# Patient Record
Sex: Male | Born: 1970 | Race: White | Hispanic: No | Marital: Single | Smoking: Former smoker
Health system: Southern US, Community
[De-identification: ages and names within clinical notes are randomized; demographics above are authoritative.]

## PROBLEM LIST (undated history)

## (undated) DIAGNOSIS — R112 Nausea with vomiting, unspecified: Secondary | ICD-10-CM

## (undated) DIAGNOSIS — K469 Unspecified abdominal hernia without obstruction or gangrene: Secondary | ICD-10-CM

## (undated) DIAGNOSIS — F329 Major depressive disorder, single episode, unspecified: Secondary | ICD-10-CM

## (undated) DIAGNOSIS — F209 Schizophrenia, unspecified: Secondary | ICD-10-CM

## (undated) DIAGNOSIS — Z9889 Other specified postprocedural states: Secondary | ICD-10-CM

## (undated) DIAGNOSIS — F32A Depression, unspecified: Secondary | ICD-10-CM

## (undated) DIAGNOSIS — G40909 Epilepsy, unspecified, not intractable, without status epilepticus: Secondary | ICD-10-CM

## (undated) DIAGNOSIS — F419 Anxiety disorder, unspecified: Secondary | ICD-10-CM

## (undated) DIAGNOSIS — F319 Bipolar disorder, unspecified: Secondary | ICD-10-CM

## (undated) HISTORY — DX: Anxiety disorder, unspecified: F41.9

## (undated) HISTORY — PX: ANKLE FRACTURE SURGERY: SHX122

## (undated) HISTORY — DX: Unspecified abdominal hernia without obstruction or gangrene: K46.9

---

## 1996-08-18 HISTORY — PX: OTHER SURGICAL HISTORY: SHX169

## 1998-02-28 ENCOUNTER — Emergency Department (HOSPITAL_COMMUNITY): Admission: EM | Admit: 1998-02-28 | Discharge: 1998-02-28 | Payer: Self-pay | Admitting: Emergency Medicine

## 1998-07-27 ENCOUNTER — Encounter: Payer: Self-pay | Admitting: Emergency Medicine

## 1998-07-27 ENCOUNTER — Emergency Department (HOSPITAL_COMMUNITY): Admission: EM | Admit: 1998-07-27 | Discharge: 1998-07-27 | Payer: Self-pay | Admitting: Emergency Medicine

## 2002-02-24 ENCOUNTER — Emergency Department (HOSPITAL_COMMUNITY): Admission: EM | Admit: 2002-02-24 | Discharge: 2002-02-24 | Payer: Self-pay

## 2002-06-20 ENCOUNTER — Emergency Department (HOSPITAL_COMMUNITY): Admission: EM | Admit: 2002-06-20 | Discharge: 2002-06-20 | Payer: Self-pay | Admitting: Emergency Medicine

## 2002-06-20 ENCOUNTER — Encounter: Payer: Self-pay | Admitting: Emergency Medicine

## 2003-04-20 ENCOUNTER — Emergency Department (HOSPITAL_COMMUNITY): Admission: AD | Admit: 2003-04-20 | Discharge: 2003-04-20 | Payer: Self-pay | Admitting: Emergency Medicine

## 2006-01-20 ENCOUNTER — Emergency Department (HOSPITAL_COMMUNITY): Admission: EM | Admit: 2006-01-20 | Discharge: 2006-01-21 | Payer: Self-pay | Admitting: Emergency Medicine

## 2007-02-27 ENCOUNTER — Emergency Department (HOSPITAL_COMMUNITY): Admission: EM | Admit: 2007-02-27 | Discharge: 2007-02-27 | Payer: Self-pay | Admitting: *Deleted

## 2007-03-28 ENCOUNTER — Emergency Department (HOSPITAL_COMMUNITY): Admission: EM | Admit: 2007-03-28 | Discharge: 2007-03-28 | Payer: Self-pay | Admitting: Emergency Medicine

## 2007-04-23 ENCOUNTER — Emergency Department (HOSPITAL_COMMUNITY): Admission: EM | Admit: 2007-04-23 | Discharge: 2007-04-23 | Payer: Self-pay | Admitting: Emergency Medicine

## 2007-06-10 ENCOUNTER — Emergency Department (HOSPITAL_COMMUNITY): Admission: EM | Admit: 2007-06-10 | Discharge: 2007-06-10 | Payer: Self-pay | Admitting: Emergency Medicine

## 2007-07-03 ENCOUNTER — Emergency Department (HOSPITAL_COMMUNITY): Admission: EM | Admit: 2007-07-03 | Discharge: 2007-07-03 | Payer: Self-pay | Admitting: Emergency Medicine

## 2007-07-07 ENCOUNTER — Emergency Department (HOSPITAL_COMMUNITY): Admission: EM | Admit: 2007-07-07 | Discharge: 2007-07-07 | Payer: Self-pay | Admitting: Emergency Medicine

## 2008-06-27 ENCOUNTER — Emergency Department (HOSPITAL_COMMUNITY): Admission: EM | Admit: 2008-06-27 | Discharge: 2008-06-27 | Payer: Self-pay | Admitting: Emergency Medicine

## 2008-10-02 ENCOUNTER — Emergency Department (HOSPITAL_COMMUNITY): Admission: EM | Admit: 2008-10-02 | Discharge: 2008-10-02 | Payer: Self-pay | Admitting: Emergency Medicine

## 2008-10-16 ENCOUNTER — Emergency Department (HOSPITAL_COMMUNITY): Admission: EM | Admit: 2008-10-16 | Discharge: 2008-10-17 | Payer: Self-pay | Admitting: Emergency Medicine

## 2008-10-29 ENCOUNTER — Ambulatory Visit: Payer: Self-pay | Admitting: Occupational Medicine

## 2009-02-04 ENCOUNTER — Emergency Department (HOSPITAL_COMMUNITY): Admission: EM | Admit: 2009-02-04 | Discharge: 2009-02-04 | Payer: Self-pay | Admitting: Emergency Medicine

## 2009-04-16 ENCOUNTER — Emergency Department (HOSPITAL_COMMUNITY): Admission: EM | Admit: 2009-04-16 | Discharge: 2009-04-16 | Payer: Self-pay | Admitting: Emergency Medicine

## 2009-07-06 ENCOUNTER — Emergency Department (HOSPITAL_COMMUNITY): Admission: EM | Admit: 2009-07-06 | Discharge: 2009-07-06 | Payer: Self-pay | Admitting: Internal Medicine

## 2011-01-10 ENCOUNTER — Emergency Department (HOSPITAL_COMMUNITY): Payer: Medicaid Other

## 2011-01-10 ENCOUNTER — Emergency Department (HOSPITAL_COMMUNITY)
Admission: EM | Admit: 2011-01-10 | Discharge: 2011-01-10 | Disposition: A | Discharge status: Home / Self Care | Payer: Medicaid Other | Attending: Emergency Medicine | Admitting: Emergency Medicine

## 2011-01-10 DIAGNOSIS — M545 Low back pain, unspecified: Secondary | ICD-10-CM | POA: Insufficient documentation

## 2011-01-10 DIAGNOSIS — F988 Other specified behavioral and emotional disorders with onset usually occurring in childhood and adolescence: Secondary | ICD-10-CM | POA: Insufficient documentation

## 2011-01-10 DIAGNOSIS — M549 Dorsalgia, unspecified: Secondary | ICD-10-CM | POA: Insufficient documentation

## 2011-01-10 DIAGNOSIS — F319 Bipolar disorder, unspecified: Secondary | ICD-10-CM | POA: Insufficient documentation

## 2011-01-10 DIAGNOSIS — M25559 Pain in unspecified hip: Secondary | ICD-10-CM | POA: Insufficient documentation

## 2011-01-10 LAB — URINALYSIS, ROUTINE W REFLEX MICROSCOPIC
Bilirubin Urine: NEGATIVE
Glucose, UA: 100 mg/dL — AB
Ketones, ur: NEGATIVE mg/dL
Specific Gravity, Urine: 1.022 (ref 1.005–1.030)
pH: 5.5 (ref 5.0–8.0)

## 2011-01-10 LAB — URINE MICROSCOPIC-ADD ON

## 2011-01-10 LAB — POCT I-STAT, CHEM 8
Creatinine, Ser: 1 mg/dL (ref 0.4–1.5)
Glucose, Bld: 212 mg/dL — ABNORMAL HIGH (ref 70–99)
Hemoglobin: 17.3 g/dL — ABNORMAL HIGH (ref 13.0–17.0)
Potassium: 4.1 mEq/L (ref 3.5–5.1)
Sodium: 138 mEq/L (ref 135–145)

## 2011-01-29 ENCOUNTER — Emergency Department (HOSPITAL_COMMUNITY)
Admission: EM | Admit: 2011-01-29 | Discharge: 2011-01-29 | Discharge status: Against Medical Advice | Payer: Medicaid Other | Attending: Emergency Medicine | Admitting: Emergency Medicine

## 2011-01-29 DIAGNOSIS — M545 Low back pain, unspecified: Secondary | ICD-10-CM | POA: Insufficient documentation

## 2011-01-29 DIAGNOSIS — F952 Tourette's disorder: Secondary | ICD-10-CM | POA: Insufficient documentation

## 2011-01-29 DIAGNOSIS — M542 Cervicalgia: Secondary | ICD-10-CM | POA: Insufficient documentation

## 2011-01-29 DIAGNOSIS — Z79899 Other long term (current) drug therapy: Secondary | ICD-10-CM | POA: Insufficient documentation

## 2011-01-29 DIAGNOSIS — F988 Other specified behavioral and emotional disorders with onset usually occurring in childhood and adolescence: Secondary | ICD-10-CM | POA: Insufficient documentation

## 2011-01-29 DIAGNOSIS — F319 Bipolar disorder, unspecified: Secondary | ICD-10-CM | POA: Insufficient documentation

## 2011-03-31 ENCOUNTER — Emergency Department (HOSPITAL_COMMUNITY): Payer: Medicaid Other

## 2011-03-31 ENCOUNTER — Emergency Department (HOSPITAL_COMMUNITY)
Admission: EM | Admit: 2011-03-31 | Discharge: 2011-03-31 | Disposition: A | Discharge status: Home / Self Care | Payer: Medicaid Other | Attending: Emergency Medicine | Admitting: Emergency Medicine

## 2011-03-31 DIAGNOSIS — R404 Transient alteration of awareness: Secondary | ICD-10-CM | POA: Insufficient documentation

## 2011-03-31 DIAGNOSIS — R51 Headache: Secondary | ICD-10-CM | POA: Insufficient documentation

## 2011-03-31 DIAGNOSIS — R109 Unspecified abdominal pain: Secondary | ICD-10-CM | POA: Insufficient documentation

## 2011-03-31 DIAGNOSIS — F988 Other specified behavioral and emotional disorders with onset usually occurring in childhood and adolescence: Secondary | ICD-10-CM | POA: Insufficient documentation

## 2011-03-31 DIAGNOSIS — F319 Bipolar disorder, unspecified: Secondary | ICD-10-CM | POA: Insufficient documentation

## 2011-03-31 DIAGNOSIS — K449 Diaphragmatic hernia without obstruction or gangrene: Secondary | ICD-10-CM | POA: Insufficient documentation

## 2011-03-31 DIAGNOSIS — S139XXA Sprain of joints and ligaments of unspecified parts of neck, initial encounter: Secondary | ICD-10-CM | POA: Insufficient documentation

## 2011-03-31 DIAGNOSIS — M542 Cervicalgia: Secondary | ICD-10-CM | POA: Insufficient documentation

## 2011-03-31 MED ORDER — IOHEXOL 300 MG/ML  SOLN
100.0000 mL | Freq: Once | INTRAMUSCULAR | Status: AC | PRN
  Administered 2011-03-31: 100 mL via INTRAVENOUS

## 2011-10-03 ENCOUNTER — Ambulatory Visit (INDEPENDENT_AMBULATORY_CARE_PROVIDER_SITE_OTHER): Payer: Medicaid Other | Admitting: Surgery

## 2011-10-03 ENCOUNTER — Encounter (INDEPENDENT_AMBULATORY_CARE_PROVIDER_SITE_OTHER): Payer: Self-pay | Admitting: Surgery

## 2011-10-03 VITALS — BP 118/80 | HR 68 | Temp 98.1°F | Resp 16 | Ht 73.0 in | Wt 201.6 lb

## 2011-10-03 DIAGNOSIS — K219 Gastro-esophageal reflux disease without esophagitis: Secondary | ICD-10-CM

## 2011-10-03 NOTE — Progress Notes (Signed)
Chief Complaint:  GERD with nocturnal reflux  History of Present Illness:  Russell [name] is an 41 y.o. male with worsening symptoms of gastroesophageal reflux. He states that he often sleeps sitting up and he gives a good history of nocturnal reflux with coughing and spitting up when lying supine. He is followed by Cameron Sprang at [facility] on [address].  He had a CT scan performed in August of last year that showed a hiatal hernia. He also a small umbilical hernia containing mesenteric fat. Rosalie Gums who read the CT, there was a moderate hiatal hernia. I would like to get an upper GI series to look at peristalsis, the location of EG junction and the size of the hiatal hernia.  Past Medical History  Diagnosis Date  . Hernia     Past Surgical History  Procedure Date  . Ankle fracture surgery 1994 - approximate  . Ligament repair 1998    right arm    Current Outpatient Prescriptions  Medication Sig Dispense Refill  . alprazolam (XANAX) 2 MG tablet Take 2 mg by mouth at bedtime as needed.      Marland Kitchen omeprazole (PRILOSEC) 40 MG capsule Take 40 mg by mouth daily.       Ultram History reviewed. No pertinent family history. Social History:   reports that he has been passively smoking.  He has never used smokeless tobacco. He reports that he drinks alcohol. He reports that he does not use illicit drugs.   REVIEW OF SYSTEMS - PERTINENT POSITIVES ONLY: GERD  Physical Exam:   Blood pressure 118/80, pulse 68, temperature 98.1 F (36.7 C), temperature source Temporal, resp. rate 16, height 6\' 1"  (1.854 m), weight 201 lb 9.6 oz (91.445 kg). Body mass index is 26.60 kg/(m^2).  Gen:  WDWN WM NAD  Neurological: Alert and oriented to person, place, and time. Motor and sensory function is grossly intact  Head: Normocephalic and atraumatic.  Eyes: Conjunctivae are normal. Pupils are equal, round, and reactive to light. No scleral icterus.  Neck: Normal range of motion. Neck supple. No  tracheal deviation or thyromegaly present.  Cardiovascular:  SR without murmurs or gallops.  No carotid bruits Respiratory: Effort normal.  No respiratory distress. No chest wall tenderness. Breath sounds normal.  No wheezes, rales or rhonchi.  Abdomen:  Flat, nontender GU: Musculoskeletal: Normal range of motion. Extremities are nontender. No cyanosis, edema or clubbing noted Lymphadenopathy: No cervical, preauricular, postauricular or axillary adenopathy is present Skin: Skin is warm and dry. No rash noted. No diaphoresis. No erythema. No pallor. Pscyh: Normal mood and affect. Behavior is normal. Judgment and thought content normal.   LABORATORY RESULTS: No results found for this or any previous visit (from the past 48 hour(s)).  RADIOLOGY RESULTS: No results found.  Problem List: There is no problem list on file for this patient.   Assessment & Plan: Positive history of mechanical reflux and hiatal hernia. Plan upper GI series followed by schedule him for a laparoscopic Nissen fundoplication. I have discussed the procedure with him in some detail and gave him a booklet on the operation and mentioned risk and complications and the possibility of having to do it open. We'll go ahead and schedule at his convenience.    Matt B. Daphine Deutscher, MD, [facility] [phone] beeper [phone]  10/03/2011 11:55 AM

## 2011-10-06 ENCOUNTER — Other Ambulatory Visit (INDEPENDENT_AMBULATORY_CARE_PROVIDER_SITE_OTHER): Payer: Self-pay | Admitting: General Surgery

## 2011-10-06 DIAGNOSIS — K449 Diaphragmatic hernia without obstruction or gangrene: Secondary | ICD-10-CM

## 2011-10-09 ENCOUNTER — Inpatient Hospital Stay (HOSPITAL_COMMUNITY): Admission: RE | Admit: 2011-10-09 | Payer: No Typology Code available for payment source | Source: Ambulatory Visit

## 2011-10-15 ENCOUNTER — Ambulatory Visit (HOSPITAL_COMMUNITY): Admission: RE | Admit: 2011-10-15 | Payer: Medicaid Other | Source: Ambulatory Visit | Admitting: Surgery

## 2011-10-15 ENCOUNTER — Encounter (HOSPITAL_COMMUNITY): Admission: RE | Payer: Self-pay | Source: Ambulatory Visit

## 2011-10-15 SURGERY — FUNDOPLICATION, NISSEN, LAPAROSCOPIC
Anesthesia: General

## 2012-02-24 ENCOUNTER — Encounter (HOSPITAL_COMMUNITY): Payer: Self-pay | Admitting: Physical Medicine and Rehabilitation

## 2012-02-24 DIAGNOSIS — Z888 Allergy status to other drugs, medicaments and biological substances status: Secondary | ICD-10-CM | POA: Insufficient documentation

## 2012-02-24 DIAGNOSIS — S01501A Unspecified open wound of lip, initial encounter: Secondary | ICD-10-CM | POA: Insufficient documentation

## 2012-02-24 DIAGNOSIS — F17200 Nicotine dependence, unspecified, uncomplicated: Secondary | ICD-10-CM | POA: Insufficient documentation
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
## 2012-02-25 ENCOUNTER — Emergency Department (HOSPITAL_COMMUNITY)
Admission: EM | Admit: 2012-02-25 | Discharge: 2012-02-25 | Disposition: A | Discharge status: Home / Self Care | Payer: Medicaid Other | Attending: Emergency Medicine | Admitting: Emergency Medicine

## 2012-02-25 DIAGNOSIS — S01511A Laceration without foreign body of lip, initial encounter: Secondary | ICD-10-CM

## 2012-02-25 MED ORDER — PENICILLIN V POTASSIUM 500 MG PO TABS: 500.0000 mg | ORAL_TABLET | Freq: Three times a day (TID) | ORAL | Status: AC
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
## 2012-02-26 NOTE — ED Provider Notes (Signed)
Medical screening examination/treatment/procedure(s) were performed by non-physician practitioner and as supervising physician I was immediately available for consultation/collaboration.  Collen Vincent, MD 02/26/12 2343 

## 2012-09-19 ENCOUNTER — Emergency Department (HOSPITAL_COMMUNITY): Payer: Medicaid Other

## 2012-09-19 ENCOUNTER — Encounter (HOSPITAL_COMMUNITY): Payer: Self-pay | Admitting: Emergency Medicine

## 2012-09-19 ENCOUNTER — Emergency Department (HOSPITAL_COMMUNITY)
Admission: EM | Admit: 2012-09-19 | Discharge: 2012-09-20 | Disposition: A | Discharge status: Home / Self Care | Payer: Medicaid Other | Attending: Emergency Medicine | Admitting: Emergency Medicine

## 2012-09-19 DIAGNOSIS — S6990XA Unspecified injury of unspecified wrist, hand and finger(s), initial encounter: Secondary | ICD-10-CM | POA: Insufficient documentation

## 2012-09-19 DIAGNOSIS — S0993XA Unspecified injury of face, initial encounter: Secondary | ICD-10-CM | POA: Insufficient documentation

## 2012-09-19 DIAGNOSIS — Z8719 Personal history of other diseases of the digestive system: Secondary | ICD-10-CM | POA: Insufficient documentation

## 2012-09-19 DIAGNOSIS — Y939 Activity, unspecified: Secondary | ICD-10-CM | POA: Insufficient documentation

## 2012-09-19 DIAGNOSIS — S79919A Unspecified injury of unspecified hip, initial encounter: Secondary | ICD-10-CM | POA: Insufficient documentation

## 2012-09-19 DIAGNOSIS — Z79899 Other long term (current) drug therapy: Secondary | ICD-10-CM | POA: Insufficient documentation

## 2012-09-19 DIAGNOSIS — S79929A Unspecified injury of unspecified thigh, initial encounter: Secondary | ICD-10-CM | POA: Insufficient documentation

## 2012-09-19 DIAGNOSIS — G40909 Epilepsy, unspecified, not intractable, without status epilepticus: Secondary | ICD-10-CM

## 2012-09-19 DIAGNOSIS — S199XXA Unspecified injury of neck, initial encounter: Secondary | ICD-10-CM | POA: Insufficient documentation

## 2012-09-19 DIAGNOSIS — Y92410 Unspecified street and highway as the place of occurrence of the external cause: Secondary | ICD-10-CM | POA: Insufficient documentation

## 2012-09-19 DIAGNOSIS — S59909A Unspecified injury of unspecified elbow, initial encounter: Secondary | ICD-10-CM | POA: Insufficient documentation

## 2012-09-19 LAB — URINALYSIS, ROUTINE W REFLEX MICROSCOPIC
Glucose, UA: NEGATIVE mg/dL
Leukocytes, UA: NEGATIVE
Protein, ur: NEGATIVE mg/dL
pH: 6 (ref 5.0–8.0)

## 2012-09-19 MED ORDER — IOHEXOL 300 MG/ML  SOLN
100.0000 mL | Freq: Once | INTRAMUSCULAR | Status: AC | PRN
  Administered 2012-09-19: 100 mL via INTRAVENOUS

## 2012-09-19 MED ORDER — SODIUM CHLORIDE 0.9 % IV SOLN
INTRAVENOUS | Status: DC
  Administered 2012-09-19 – 2012-09-20 (×2): via INTRAVENOUS
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
## 2012-09-20 LAB — CBC WITH DIFFERENTIAL/PLATELET
Lymphocytes Relative: 20 % (ref 12–46)
Lymphs Abs: 1.8 10*3/uL (ref 0.7–4.0)
Neutrophils Relative %: 71 % (ref 43–77)
Platelets: 227 10*3/uL (ref 150–400)
RBC: 4.68 MIL/uL (ref 4.22–5.81)
WBC: 9 10*3/uL (ref 4.0–10.5)

## 2012-09-20 LAB — POCT I-STAT, CHEM 8
BUN: 8 mg/dL (ref 6–23)
Chloride: 104 mEq/L (ref 96–112)
HCT: 44 % (ref 39.0–52.0)
Sodium: 138 mEq/L (ref 135–145)
TCO2: 25 mmol/L (ref 0–100)

## 2012-09-20 MED ORDER — METHOCARBAMOL 500 MG PO TABS: 500.0000 mg | ORAL_TABLET | Freq: Two times a day (BID) | ORAL | Status: DC

## 2012-09-20 MED ORDER — HYDROCODONE-ACETAMINOPHEN 5-325 MG PO TABS: 2.0000 | ORAL_TABLET | ORAL | Status: DC | PRN

## 2012-09-20 MED ORDER — HYDROCODONE-ACETAMINOPHEN 5-325 MG PO TABS
2.0000 | ORAL_TABLET | Freq: Once | ORAL | Status: AC
  Administered 2012-09-20: 2 via ORAL
  Filled 2012-09-20: qty 2
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
## 2012-09-20 NOTE — ED Notes (Signed)
While ambulating pt denied lightheadedness/dizziness.  Pt back in room stating he is ready to leave.

## 2012-09-20 NOTE — ED Notes (Signed)
Pt discharged.Vital signs stable and GCS 15 

## 2012-09-21 NOTE — ED Provider Notes (Signed)
Medical screening examination/treatment/procedure(s) were conducted as a shared visit with non-physician practitioner(s) and myself.  I personally evaluated the patient during the encounter  Doug Sou, MD 09/21/12 1337

## 2013-01-22 ENCOUNTER — Emergency Department (HOSPITAL_COMMUNITY)
Admission: EM | Admit: 2013-01-22 | Discharge: 2013-01-22 | Disposition: A | Discharge status: Home / Self Care | Payer: Medicaid Other | Attending: Emergency Medicine | Admitting: Emergency Medicine

## 2013-01-22 ENCOUNTER — Encounter (HOSPITAL_COMMUNITY): Payer: Self-pay | Admitting: *Deleted

## 2013-01-22 DIAGNOSIS — H53149 Visual discomfort, unspecified: Secondary | ICD-10-CM | POA: Insufficient documentation

## 2013-01-22 DIAGNOSIS — Z79899 Other long term (current) drug therapy: Secondary | ICD-10-CM | POA: Insufficient documentation

## 2013-01-22 DIAGNOSIS — R51 Headache: Secondary | ICD-10-CM | POA: Insufficient documentation

## 2013-01-22 MED ORDER — KETOROLAC TROMETHAMINE 30 MG/ML IJ SOLN
30.0000 mg | Freq: Once | INTRAMUSCULAR | Status: AC
  Administered 2013-01-22: 30 mg via INTRAVENOUS
  Filled 2013-01-22: qty 1

## 2013-01-22 MED ORDER — METOCLOPRAMIDE HCL 5 MG/ML IJ SOLN
10.0000 mg | Freq: Once | INTRAMUSCULAR | Status: AC
  Administered 2013-01-22: 10 mg via INTRAVENOUS
  Filled 2013-01-22: qty 2

## 2013-01-22 MED ORDER — SODIUM CHLORIDE 0.9 % IV SOLN
Freq: Once | INTRAVENOUS | Status: AC
  Administered 2013-01-22: 03:00:00 via INTRAVENOUS

## 2013-01-22 MED ORDER — DIPHENHYDRAMINE HCL 50 MG/ML IJ SOLN
12.5000 mg | Freq: Once | INTRAMUSCULAR | Status: AC
  Administered 2013-01-22: 12.5 mg via INTRAVENOUS
  Filled 2013-01-22: qty 1

## 2013-01-22 NOTE — ED Notes (Signed)
The pt reports that he is having a headache and when he gets these headaches the fluid rushes out of his nose.  He also has a lump growing behind his lt ear causing more pressure

## 2013-01-22 NOTE — ED Notes (Signed)
Chronic diffuse h/a's.  Feels like his head is crushing/brace. States, "x2 bumps behind his left ear. No visual deficits.

## 2013-01-22 NOTE — ED Provider Notes (Signed)
History     CSN: [id number]  Arrival date & time 01/22/13  0143   First MD Initiated Contact with Patient 01/22/13 0215      Chief Complaint  Patient presents with  . Headache    (Consider location/radiation/quality/duration/timing/severity/associated sxs/prior treatment) HPI Comments: 42 year old gentleman, with a history of seizures.  He takes Depakote on a regular basis, states, that for the last, year.  He's had intermittent headaches that are increasing in frequency.  Global in nature, feels, like somebody is squeezing his head.  He becomes photophobic, and he gets a "gush" of mucus from his nose.  He, states his neurologist aware of this, and tonight, told him to come immediately to the emergency department for evaluation.  His never tried to take any over-the-counter nonsteroidals or Tylenol for this discomfort.  Denies any recent seizure activity or trauma  Patient is a 42 y.o. male presenting with headaches. The history is provided by the patient.  Headache Pain location:  Generalized Quality:  Dull Radiates to:  Does not radiate Severity currently:  10/10 Severity at highest:  10/10 Onset quality:  Gradual Duration:  3 hours Timing:  Constant Progression:  Unchanged Chronicity:  Recurrent Similar to prior headaches: yes   Context: bright light   Relieved by:  None tried Worsened by:  Nothing tried Ineffective treatments:  None tried Associated symptoms: numbness, photophobia and sinus pressure   Associated symptoms: no abdominal pain, no congestion, no cough, no dizziness, no ear pain, no pain, no fever, no hearing loss, no nausea, no near-syncope, no neck pain, no neck stiffness, no seizures and no sore throat     Past Medical History  Diagnosis Date  . Hernia     Past Surgical History  Procedure Laterality Date  . Ankle fracture surgery  1994 - approximate  . Ligament repair  1998    right arm    No family history on file.  History  Substance Use Topics   . Smoking status: Passive Smoke Exposure - Never Smoker  . Smokeless tobacco: Never Used  . Alcohol Use: Yes     Comment: socially      Review of Systems  Constitutional: Negative for fever and chills.  HENT: Positive for rhinorrhea and sinus pressure. Negative for hearing loss, ear pain, congestion, sore throat, trouble swallowing, neck pain, neck stiffness, voice change and ear discharge.   Eyes: Positive for photophobia. Negative for pain and visual disturbance.  Respiratory: Negative for cough.   Cardiovascular: Negative for near-syncope.  Gastrointestinal: Negative for nausea and abdominal pain.  Neurological: Positive for numbness and headaches. Negative for dizziness, seizures and weakness.  All other systems reviewed and are negative.    Allergies  Ultram  Home Medications   Current Outpatient Rx  Name  Route  Sig  Dispense  Refill  . divalproex (DEPAKOTE) 500 MG DR tablet   Oral   Take 500 mg by mouth 2 (two) times daily.           BP 119/76  Pulse 85  Temp(Src) 98.2 F (36.8 C) (Oral)  Resp 18  SpO2 96%  Physical Exam  Nursing note and vitals reviewed. Constitutional: He is oriented to person, place, and time. He appears well-developed and well-nourished.  HENT:  Head: Normocephalic and atraumatic.  Right Ear: External ear normal.  Left Ear: External ear normal.  Mouth/Throat: Oropharynx is clear and moist.  Eyes: Pupils are equal, round, and reactive to light.  Neck: Normal range of motion.  Cardiovascular:  Normal rate and regular rhythm.   Pulmonary/Chest: Effort normal and breath sounds normal.  Musculoskeletal: Normal range of motion.  Lymphadenopathy:       Head (left side): Posterior auricular adenopathy present.    He has no cervical adenopathy.  A single node behind the L ear   Neurological: He is alert and oriented to person, place, and time.  Skin: Skin is warm and dry. No rash noted. No erythema. No pallor.    ED Course   Procedures (including critical care time)  Labs Reviewed - No data to display No results found.   No diagnosis found.    MDM  We'll treat with the Benadryl, oral Reglan, and reassess at this time.  I do not feel the patient needs a head CT, without history of trauma, or recent seizure. Sleeping soundly in no apparent distress  I've asked the patient.  To make an appointment with his primary care physician and his neurologist to discuss further his headaches       Arman Filter, NP 01/22/13 0505

## 2013-01-23 NOTE — ED Provider Notes (Signed)
Medical screening examination/treatment/procedure(s) were performed by non-physician practitioner and as supervising physician I was immediately available for consultation/collaboration.   Phong Isenberg W Brenn Gatton, MD 01/23/13 0511 

## 2014-04-17 ENCOUNTER — Encounter (HOSPITAL_COMMUNITY): Payer: Self-pay | Admitting: Emergency Medicine

## 2014-04-17 ENCOUNTER — Emergency Department (HOSPITAL_COMMUNITY)
Admission: EM | Admit: 2014-04-17 | Discharge: 2014-04-17 | Disposition: A | Discharge status: Home / Self Care | Payer: Medicaid Other | Attending: Emergency Medicine | Admitting: Emergency Medicine

## 2014-04-17 ENCOUNTER — Emergency Department (HOSPITAL_COMMUNITY): Payer: Medicaid Other

## 2014-04-17 DIAGNOSIS — G40909 Epilepsy, unspecified, not intractable, without status epilepticus: Secondary | ICD-10-CM | POA: Diagnosis not present

## 2014-04-17 DIAGNOSIS — S6990XA Unspecified injury of unspecified wrist, hand and finger(s), initial encounter: Secondary | ICD-10-CM | POA: Diagnosis present

## 2014-04-17 DIAGNOSIS — Y9389 Activity, other specified: Secondary | ICD-10-CM | POA: Diagnosis not present

## 2014-04-17 DIAGNOSIS — Z8719 Personal history of other diseases of the digestive system: Secondary | ICD-10-CM | POA: Diagnosis not present

## 2014-04-17 DIAGNOSIS — T148XXA Other injury of unspecified body region, initial encounter: Secondary | ICD-10-CM

## 2014-04-17 DIAGNOSIS — F319 Bipolar disorder, unspecified: Secondary | ICD-10-CM | POA: Insufficient documentation

## 2014-04-17 DIAGNOSIS — M79641 Pain in right hand: Secondary | ICD-10-CM

## 2014-04-17 DIAGNOSIS — W2209XA Striking against other stationary object, initial encounter: Secondary | ICD-10-CM | POA: Diagnosis not present

## 2014-04-17 DIAGNOSIS — Z79899 Other long term (current) drug therapy: Secondary | ICD-10-CM | POA: Diagnosis not present

## 2014-04-17 DIAGNOSIS — IMO0002 Reserved for concepts with insufficient information to code with codable children: Secondary | ICD-10-CM | POA: Diagnosis not present

## 2014-04-17 DIAGNOSIS — Y9289 Other specified places as the place of occurrence of the external cause: Secondary | ICD-10-CM | POA: Diagnosis not present

## 2014-04-17 HISTORY — DX: Bipolar disorder, unspecified: F31.9

## 2014-04-17 HISTORY — DX: Epilepsy, unspecified, not intractable, without status epilepticus: G40.909

## 2014-04-17 MED ORDER — HYDROCODONE-ACETAMINOPHEN 5-325 MG PO TABS
1.0000 | ORAL_TABLET | Freq: Four times a day (QID) | ORAL | Status: DC | PRN
Start: 2014-04-17 — End: 2014-04-18

## 2014-04-17 MED ORDER — ONDANSETRON 4 MG PO TBDP
8.0000 mg | ORAL_TABLET | Freq: Once | ORAL | Status: AC
  Administered 2014-04-17: 8 mg via ORAL
  Filled 2014-04-17: qty 2

## 2014-04-17 MED ORDER — HYDROCODONE-ACETAMINOPHEN 5-325 MG PO TABS
2.0000 | ORAL_TABLET | Freq: Once | ORAL | Status: AC
  Administered 2014-04-17: 2 via ORAL
  Filled 2014-04-17: qty 2
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
## 2014-04-17 NOTE — ED Notes (Signed)
Pt reports punching a wall with right hand tonight. States "I have some mental problems and I thought something was attacking me." Pt denies any SI/HI. Pt AO x4, NAD. Pulses intact.

## 2014-04-17 NOTE — Discharge Instructions (Signed)
Abrasions An abrasion is a cut or scrape of the skin. Abrasions do not go through all layers of the skin. HOME CARE  If a bandage (dressing) was put on your wound, change it as told by your doctor. If the bandage sticks, soak it off with warm.  Wash the area with water and soap 2 times a day. Rinse off the soap. Pat the area dry with a clean towel.  Put on medicated cream (ointment) as told by your doctor.  Change your bandage right away if it gets wet or dirty.  Only take medicine as told by your doctor.  See your doctor within 24-48 hours to get your wound checked.  Check your wound for redness, puffiness (swelling), or yellowish-white fluid (pus). GET HELP RIGHT AWAY IF:   You have more pain in the wound.  You have redness, swelling, or tenderness around the wound.  You have pus coming from the wound.  You have a fever or lasting symptoms for more than 2-3 days.  You have a fever and your symptoms suddenly get worse.  You have a bad smell coming from the wound or bandage. MAKE SURE YOU:   Understand these instructions.  Will watch your condition.  Will get help right away if you are not doing well or get worse. Document Released: 01/21/2008 Document Revised: 04/28/2012 Document Reviewed: 07/08/2011 Exit[employer] Patient Information 2015 [employer]. This information is not intended to replace advice given to you by your health care provider. Make sure you discuss any questions you have with your health care provider.  

## 2014-04-18 ENCOUNTER — Encounter (HOSPITAL_COMMUNITY): Payer: Self-pay | Admitting: Emergency Medicine

## 2014-04-18 ENCOUNTER — Emergency Department (HOSPITAL_COMMUNITY)
Admission: EM | Admit: 2014-04-18 | Discharge: 2014-04-19 | Disposition: A | Discharge status: Other Acute Inpatient Hospital | Payer: Medicaid Other | Attending: Dermatology | Admitting: Dermatology

## 2014-04-18 DIAGNOSIS — R45851 Suicidal ideations: Secondary | ICD-10-CM | POA: Diagnosis not present

## 2014-04-18 DIAGNOSIS — Z79899 Other long term (current) drug therapy: Secondary | ICD-10-CM | POA: Diagnosis not present

## 2014-04-18 DIAGNOSIS — IMO0002 Reserved for concepts with insufficient information to code with codable children: Secondary | ICD-10-CM | POA: Insufficient documentation

## 2014-04-18 DIAGNOSIS — R45850 Homicidal ideations: Secondary | ICD-10-CM | POA: Insufficient documentation

## 2014-04-18 DIAGNOSIS — Y929 Unspecified place or not applicable: Secondary | ICD-10-CM | POA: Diagnosis not present

## 2014-04-18 DIAGNOSIS — F3130 Bipolar disorder, current episode depressed, mild or moderate severity, unspecified: Secondary | ICD-10-CM | POA: Insufficient documentation

## 2014-04-18 DIAGNOSIS — Z8719 Personal history of other diseases of the digestive system: Secondary | ICD-10-CM | POA: Diagnosis not present

## 2014-04-18 DIAGNOSIS — Z8659 Personal history of other mental and behavioral disorders: Secondary | ICD-10-CM | POA: Diagnosis not present

## 2014-04-18 DIAGNOSIS — F17200 Nicotine dependence, unspecified, uncomplicated: Secondary | ICD-10-CM | POA: Diagnosis not present

## 2014-04-18 DIAGNOSIS — F200 Paranoid schizophrenia: Secondary | ICD-10-CM

## 2014-04-18 DIAGNOSIS — G40909 Epilepsy, unspecified, not intractable, without status epilepticus: Secondary | ICD-10-CM | POA: Insufficient documentation

## 2014-04-18 DIAGNOSIS — Y9389 Activity, other specified: Secondary | ICD-10-CM | POA: Diagnosis not present

## 2014-04-18 DIAGNOSIS — S6990XA Unspecified injury of unspecified wrist, hand and finger(s), initial encounter: Secondary | ICD-10-CM | POA: Insufficient documentation

## 2014-04-18 DIAGNOSIS — W2209XA Striking against other stationary object, initial encounter: Secondary | ICD-10-CM | POA: Diagnosis not present

## 2014-04-18 HISTORY — DX: Schizophrenia, unspecified: F20.9

## 2014-04-18 LAB — COMPREHENSIVE METABOLIC PANEL
ALBUMIN: 4 g/dL (ref 3.5–5.2)
ALK PHOS: 110 U/L (ref 39–117)
ALT: 34 U/L (ref 0–53)
AST: 30 U/L (ref 0–37)
Anion gap: 16 — ABNORMAL HIGH (ref 5–15)
BUN: 8 mg/dL (ref 6–23)
CO2: 24 mEq/L (ref 19–32)
CREATININE: 1.02 mg/dL (ref 0.50–1.35)
Calcium: 9.5 mg/dL (ref 8.4–10.5)
Chloride: 100 mEq/L (ref 96–112)
GFR calc Af Amer: >90 mL/min (ref >90)
GFR calc non Af Amer: 89 mL/min — ABNORMAL LOW (ref >90)
Glucose, Bld: 110 mg/dL — ABNORMAL HIGH (ref 70–99)
POTASSIUM: 3.8 meq/L (ref 3.7–5.3)
Sodium: 140 mEq/L (ref 137–147)
TOTAL PROTEIN: 7.9 g/dL (ref 6.0–8.3)
Total Bilirubin: <0.2 mg/dL — ABNORMAL LOW (ref 0.3–1.2)

## 2014-04-18 LAB — CBC
HEMATOCRIT: 45.4 % (ref 39.0–52.0)
Hemoglobin: 15.7 g/dL (ref 13.0–17.0)
MCH: 30.8 pg (ref 26.0–34.0)
MCHC: 34.6 g/dL (ref 30.0–36.0)
MCV: 89.2 fL (ref 78.0–100.0)
PLATELETS: 264 10*3/uL (ref 150–400)
RBC: 5.09 MIL/uL (ref 4.22–5.81)
RDW: 14.3 % (ref 11.5–15.5)
WBC: 9.2 10*3/uL (ref 4.0–10.5)

## 2014-04-18 LAB — RAPID URINE DRUG SCREEN, HOSP PERFORMED
Amphetamines: NOT DETECTED
Barbiturates: NOT DETECTED
Benzodiazepines: POSITIVE — AB
COCAINE: NOT DETECTED
OPIATES: POSITIVE — AB
Tetrahydrocannabinol: NOT DETECTED

## 2014-04-18 LAB — VALPROIC ACID LEVEL: Valproic Acid Lvl: 16.6 ug/mL — ABNORMAL LOW (ref 50.0–100.0)

## 2014-04-18 LAB — ETHANOL: ALCOHOL ETHYL (B): 171 mg/dL — AB (ref 0–11)

## 2014-04-18 LAB — SALICYLATE LEVEL: Salicylate Lvl: <2 mg/dL — ABNORMAL LOW (ref 2.8–20.0)

## 2014-04-18 LAB — ACETAMINOPHEN LEVEL: Acetaminophen (Tylenol), Serum: <15 ug/mL (ref 10–30)

## 2014-04-18 MED ORDER — IMIPRAMINE HCL 50 MG PO TABS
100.0000 mg | ORAL_TABLET | Freq: Every day | ORAL | Status: DC
  Administered 2014-04-18: 100 mg via ORAL
  Filled 2014-04-18 (×2): qty 2

## 2014-04-18 MED ORDER — ALUM & MAG HYDROXIDE-SIMETH 200-200-20 MG/5ML PO SUSP
30.0000 mL | ORAL | Status: DC | PRN
  Administered 2014-04-18: 30 mL via ORAL
  Filled 2014-04-18: qty 30

## 2014-04-18 MED ORDER — IMIPRAMINE HCL 50 MG PO TABS: 50.0000 mg | ORAL_TABLET | Freq: Two times a day (BID) | ORAL | Status: DC

## 2014-04-18 MED ORDER — DIVALPROEX SODIUM ER 500 MG PO TB24
500.0000 mg | ORAL_TABLET | Freq: Every day | ORAL | Status: DC
  Administered 2014-04-19: 500 mg via ORAL
  Filled 2014-04-18: qty 1

## 2014-04-18 MED ORDER — PANTOPRAZOLE SODIUM 40 MG PO TBEC
40.0000 mg | DELAYED_RELEASE_TABLET | Freq: Every day | ORAL | Status: DC
  Administered 2014-04-18 – 2014-04-19 (×2): 40 mg via ORAL
  Filled 2014-04-18 (×2): qty 1

## 2014-04-18 MED ORDER — ONDANSETRON HCL 4 MG PO TABS: 4.0000 mg | ORAL_TABLET | Freq: Three times a day (TID) | ORAL | Status: DC | PRN

## 2014-04-18 MED ORDER — PRAZOSIN HCL 2 MG PO CAPS
2.0000 mg | ORAL_CAPSULE | Freq: Every day | ORAL | Status: DC
  Administered 2014-04-18: 2 mg via ORAL
  Filled 2014-04-18 (×2): qty 1

## 2014-04-18 MED ORDER — IMIPRAMINE HCL 50 MG PO TABS
50.0000 mg | ORAL_TABLET | Freq: Every day | ORAL | Status: DC
  Administered 2014-04-19: 50 mg via ORAL
  Filled 2014-04-18: qty 1

## 2014-04-18 MED ORDER — IBUPROFEN 200 MG PO TABS
600.0000 mg | ORAL_TABLET | Freq: Three times a day (TID) | ORAL | Status: DC | PRN
  Administered 2014-04-18: 600 mg via ORAL
  Filled 2014-04-18: qty 3

## 2014-04-18 MED ORDER — LORAZEPAM 1 MG PO TABS
1.0000 mg | ORAL_TABLET | Freq: Three times a day (TID) | ORAL | Status: DC | PRN
  Administered 2014-04-18: 1 mg via ORAL
  Filled 2014-04-18: qty 1

## 2014-04-18 MED ORDER — BENZTROPINE MESYLATE 1 MG PO TABS
0.5000 mg | ORAL_TABLET | Freq: Every day | ORAL | Status: DC
  Administered 2014-04-18: 22:00:00 via ORAL
  Filled 2014-04-18: qty 1

## 2014-04-18 MED ORDER — NICOTINE 21 MG/24HR TD PT24: 21.0000 mg | MEDICATED_PATCH | Freq: Every day | TRANSDERMAL | Status: DC

## 2014-04-18 MED ORDER — DIVALPROEX SODIUM ER 500 MG PO TB24
1000.0000 mg | ORAL_TABLET | Freq: Every day | ORAL | Status: DC
  Administered 2014-04-18: 1000 mg via ORAL
  Filled 2014-04-18 (×2): qty 2

## 2014-04-18 MED ORDER — DIVALPROEX SODIUM ER 500 MG PO TB24: 500.0000 mg | ORAL_TABLET | Freq: Two times a day (BID) | ORAL | Status: DC

## 2014-04-18 NOTE — ED Provider Notes (Signed)
CSN: [id number]     Arrival date & time 04/18/14  1702 History   First MD Initiated Contact with Patient 04/18/14 1747    This chart was scribed for [employer] working with Suzi Roots, MD by Tonye Royalty, ED Scribe. This patient was seen in room WLCON/WLCON and the patient's care was started at 5:49 PM.     Chief Complaint  Patient presents with  . Suicidal  . Homicidal   The history is provided by the patient. No language interpreter was used.   HPI Comments: Tijuan Dantes is a 43 y.o. male who presents to the Emergency Department complaining of suicidal ideation and homicidal ideation with onset a few months ago. He reports a plan to run in front of a truck. He reports associated injury to his right hand due to punching a wall, thinking it was a person, sustained last night. He had x-rays which were negative last night -- although images may need repeated if symptoms do not improve to rule-out occult fracture. He reports past medical history of epilepsy, schizophrenia and bipolar disorder and that he takes many medications for these. Patient reports recent alcohol use. He denies fever, cough, or other illness symptoms.  Past Medical History  Diagnosis Date  . Hernia   . Bipolar 1 disorder   . Epilepsy    Past Surgical History  Procedure Laterality Date  . Ankle fracture surgery  1994 - approximate  . Ligament repair  1998    right arm   No family history on file. History  Substance Use Topics  . Smoking status: Current Some Day Smoker    Types: Cigarettes  . Smokeless tobacco: Never Used  . Alcohol Use: Yes     Comment: socially    Review of Systems  Constitutional: Negative for fever.  HENT: Negative for rhinorrhea and sore throat.   Eyes: Negative for redness.  Respiratory: Negative for cough.   Cardiovascular: Negative for chest pain.  Gastrointestinal: Negative for nausea, vomiting, abdominal pain and diarrhea.  Genitourinary: Negative for dysuria.   Musculoskeletal: Positive for arthralgias and joint swelling. Negative for myalgias.  Skin: Negative for rash.  Neurological: Negative for headaches.  Psychiatric/Behavioral: Positive for suicidal ideas.    Allergies  Ultram  Home Medications   Prior to Admission medications   Medication Sig Start Date End Date Taking? Authorizing Provider  alprazolam Prudy Feeler) 2 MG tablet Take 2 mg by mouth 3 (three) times daily as needed for anxiety.  09/10/12   Historical Provider, MD  benztropine (COGENTIN) 0.5 MG tablet Take 0.5 mg by mouth at bedtime.    Historical Provider, MD  divalproex (DEPAKOTE ER) 500 MG 24 hr tablet Take 500-1,000 mg by mouth 2 (two) times daily. Take 1 tablet (500 mg) every morning and 2 tablets (1000 mg) at bedtime    Historical Provider, MD  HYDROcodone-acetaminophen (NORCO/VICODIN) 5-325 MG per tablet Take 1 tablet by mouth every 6 (six) hours as needed for moderate pain or severe pain. 04/17/14   Mora Bellman, PA-C  imipramine (TOFRANIL) 50 MG tablet Take 50-100 mg by mouth 2 (two) times daily. Take 1 tablet (50 mg) every morning and 2 tablets (100 mg) every night    Historical Provider, MD  omeprazole (PRILOSEC) 40 MG capsule Take 80 mg by mouth daily.    Historical Provider, MD  prazosin (MINIPRESS) 2 MG capsule Take 2 mg by mouth at bedtime. For nightmares    Historical Provider, MD   BP 120/61  Pulse  86  Temp(Src) 98.3 F (36.8 C) (Oral)  Resp 20  SpO2 97%  Physical Exam  Nursing note and vitals reviewed. Constitutional: He appears well-developed and well-nourished.  HENT:  Head: Normocephalic and atraumatic.  Eyes: Conjunctivae are normal. Right eye exhibits no discharge. Left eye exhibits no discharge.  Neck: Normal range of motion. Neck supple.  Cardiovascular: Normal rate, regular rhythm and normal heart sounds.   Pulmonary/Chest: Effort normal and breath sounds normal.  Abdominal: Soft. There is no tenderness.  Musculoskeletal: He exhibits edema and  tenderness.  Swelling and tenderness over dorsum of R hand, 3rd-5th metacarpals. Distal sensation intact. Cap refill < 2 second in fingers of R hand.   Neurological: He is alert.  Skin: Skin is warm and dry.  Psychiatric: His affect is blunt. His speech is slurred (slight). He is agitated. He exhibits a depressed mood. He expresses homicidal and suicidal ideation. He expresses suicidal plans and homicidal plans. He exhibits normal recent memory.    ED Course  Procedures (including critical care time) Labs Review Labs Reviewed  COMPREHENSIVE METABOLIC PANEL - Abnormal; Notable for the following:    Glucose, Bld 110 (*)    Total Bilirubin <0.2 (*)    GFR calc non Af Amer 89 (*)    Anion gap 16 (*)    All other components within normal limits  ETHANOL - Abnormal; Notable for the following:    Alcohol, Ethyl (B) 171 (*)    All other components within normal limits  SALICYLATE LEVEL - Abnormal; Notable for the following:    Salicylate Lvl <2.0 (*)    All other components within normal limits  URINE RAPID DRUG SCREEN (HOSP PERFORMED) - Abnormal; Notable for the following:    Opiates POSITIVE (*)    Benzodiazepines POSITIVE (*)    All other components within normal limits  VALPROIC ACID LEVEL - Abnormal; Notable for the following:    Valproic Acid Lvl 16.6 (*)    All other components within normal limits  ACETAMINOPHEN LEVEL  CBC    Imaging Review Dg Hand Complete Right  04/17/2014   CLINICAL DATA:  Right hand injury status post punching a wall.  EXAM: RIGHT HAND - COMPLETE 3+ VIEW  COMPARISON:  09/19/2012 wrist radiographs, 07/07/2007  FINDINGS: Radial angulation at the fifth PIP joint. No displaced acute fracture or dislocation. Second DIP and fifth PIP degenerative change. Soft tissue swelling overlies the MCP joints on the lateral view.  IMPRESSION: Soft tissue swelling overlying the MCP joints on the lateral view. No displaced acute fracture identified.  Radial angulation of fifth  digit at the PIP joint may reflect sequelae of remote injury. Correlate with range of motion to exclude an acute component.  If concern for an acute fracture persists, recommend a repeat radiograph in 7-10 days to evaluate for interval change or callus formation.   Electronically Signed   By: Jearld Lesch M.D.   On: 04/17/2014 22:13     EKG Interpretation None      COORDINATION OF CARE:  6:02 PM Patient seen and examined. Holding orders completed.   Vital signs reviewed and are as follows: BP 120/61  Pulse 86  Temp(Src) 98.3 F (36.8 C) (Oral)  Resp 20  SpO2 97%  Pending TTS eval.   6:37 PM Labs reviewed. Pt is medically cleared.    8:15 PM Pending TTS eval.    MDM   Final diagnoses:  Suicidal ideation   Pending TTS eval.   I personally performed the  services described in this documentation, which was scribed in my presence. The recorded information has been reviewed and is accurate.      Renne Crigler, PA-C 04/18/14 2017
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
## 2014-04-18 NOTE — ED Notes (Signed)
Pt has blue shirt green shorts black sandals placed behind triage desk

## 2014-04-18 NOTE — ED Notes (Signed)
Patient is anxious, frustrated. Patient's mother Dennie Bible at bedside. Patient signed consent papers for mother Issiah Huffaker [phone], June Leap girlfriend [phone], and Arben Packman father [phone].  Encouragement offered. Given Ativan, Mylanta, Motrin.  Q 15 safety checks in place.
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
## 2014-04-18 NOTE — ED Provider Notes (Signed)
Medical screening examination/treatment/procedure(s) were performed by non-physician practitioner and as supervising physician I was immediately available for consultation/collaboration.   EKG Interpretation None       Nakya Weyand M Queena Monrreal, MD 04/18/14 0416 

## 2014-04-19 ENCOUNTER — Encounter (HOSPITAL_COMMUNITY): Payer: Self-pay

## 2014-04-19 ENCOUNTER — Inpatient Hospital Stay (HOSPITAL_COMMUNITY)
Admission: AD | Admit: 2014-04-19 | Discharge: 2014-04-25 | DRG: 885 | Disposition: A | Discharge status: Home / Self Care | Payer: Medicaid Other | Source: Intra-hospital | Attending: Psychiatry | Admitting: Psychiatry

## 2014-04-19 DIAGNOSIS — F29 Unspecified psychosis not due to a substance or known physiological condition: Secondary | ICD-10-CM

## 2014-04-19 DIAGNOSIS — K219 Gastro-esophageal reflux disease without esophagitis: Secondary | ICD-10-CM | POA: Diagnosis present

## 2014-04-19 DIAGNOSIS — R45851 Suicidal ideations: Secondary | ICD-10-CM | POA: Diagnosis not present

## 2014-04-19 DIAGNOSIS — F1320 Sedative, hypnotic or anxiolytic dependence, uncomplicated: Secondary | ICD-10-CM | POA: Diagnosis present

## 2014-04-19 DIAGNOSIS — G40909 Epilepsy, unspecified, not intractable, without status epilepticus: Secondary | ICD-10-CM | POA: Diagnosis present

## 2014-04-19 DIAGNOSIS — F200 Paranoid schizophrenia: Secondary | ICD-10-CM | POA: Diagnosis present

## 2014-04-19 DIAGNOSIS — F1120 Opioid dependence, uncomplicated: Secondary | ICD-10-CM | POA: Diagnosis present

## 2014-04-19 DIAGNOSIS — F329 Major depressive disorder, single episode, unspecified: Principal | ICD-10-CM | POA: Diagnosis present

## 2014-04-19 DIAGNOSIS — Z5987 Material hardship due to limited financial resources, not elsewhere classified: Secondary | ICD-10-CM

## 2014-04-19 DIAGNOSIS — F17200 Nicotine dependence, unspecified, uncomplicated: Secondary | ICD-10-CM | POA: Diagnosis present

## 2014-04-19 DIAGNOSIS — F32A Depression, unspecified: Secondary | ICD-10-CM | POA: Diagnosis present

## 2014-04-19 DIAGNOSIS — Z598 Other problems related to housing and economic circumstances: Secondary | ICD-10-CM | POA: Diagnosis not present

## 2014-04-19 DIAGNOSIS — F1994 Other psychoactive substance use, unspecified with psychoactive substance-induced mood disorder: Secondary | ICD-10-CM | POA: Diagnosis present

## 2014-04-19 DIAGNOSIS — F319 Bipolar disorder, unspecified: Secondary | ICD-10-CM | POA: Diagnosis present

## 2014-04-19 DIAGNOSIS — Z5989 Other problems related to housing and economic circumstances: Secondary | ICD-10-CM | POA: Diagnosis not present

## 2014-04-19 DIAGNOSIS — F411 Generalized anxiety disorder: Secondary | ICD-10-CM | POA: Diagnosis present

## 2014-04-19 DIAGNOSIS — F1123 Opioid dependence with withdrawal: Secondary | ICD-10-CM

## 2014-04-19 DIAGNOSIS — G4700 Insomnia, unspecified: Secondary | ICD-10-CM | POA: Diagnosis present

## 2014-04-19 MED ORDER — CHLORDIAZEPOXIDE HCL 25 MG PO CAPS
25.0000 mg | ORAL_CAPSULE | Freq: Four times a day (QID) | ORAL | Status: DC | PRN
  Administered 2014-04-19 – 2014-04-20 (×2): 25 mg via ORAL
  Filled 2014-04-19 (×2): qty 1

## 2014-04-19 MED ORDER — ACETAMINOPHEN 325 MG PO TABS
650.0000 mg | ORAL_TABLET | Freq: Four times a day (QID) | ORAL | Status: DC | PRN
Start: 2014-04-19 — End: 2014-04-26

## 2014-04-19 MED ORDER — TRAZODONE HCL 50 MG PO TABS
50.0000 mg | ORAL_TABLET | Freq: Every evening | ORAL | Status: DC | PRN
  Administered 2014-04-19: 50 mg via ORAL
  Filled 2014-04-19: qty 1

## 2014-04-19 MED ORDER — DIVALPROEX SODIUM 500 MG PO DR TAB
500.0000 mg | DELAYED_RELEASE_TABLET | Freq: Two times a day (BID) | ORAL | Status: DC
  Administered 2014-04-19 – 2014-04-25 (×12): 500 mg via ORAL
  Filled 2014-04-19 (×6): qty 1
  Filled 2014-04-19: qty 28
  Filled 2014-04-19 (×5): qty 1
  Filled 2014-04-19: qty 28
  Filled 2014-04-19 (×2): qty 1
  Filled 2014-04-19: qty 28
  Filled 2014-04-19 (×2): qty 1
  Filled 2014-04-19: qty 28

## 2014-04-19 MED ORDER — MAGNESIUM HYDROXIDE 400 MG/5ML PO SUSP
30.0000 mL | Freq: Every day | ORAL | Status: DC | PRN
Start: 2014-04-19 — End: 2014-04-26

## 2014-04-19 MED ORDER — ALUM & MAG HYDROXIDE-SIMETH 200-200-20 MG/5ML PO SUSP
30.0000 mL | ORAL | Status: DC | PRN
  Administered 2014-04-19 – 2014-04-23 (×4): 30 mL via ORAL

## 2014-04-19 MED ORDER — TRAZODONE HCL 50 MG PO TABS
50.0000 mg | ORAL_TABLET | Freq: Every evening | ORAL | Status: DC | PRN
  Administered 2014-04-19 – 2014-04-20 (×2): 50 mg via ORAL
  Filled 2014-04-19 (×2): qty 1

## 2014-04-19 NOTE — Tx Team (Signed)
Initial Interdisciplinary Treatment Plan   PATIENT STRESSORS: Financial difficulties Health problems   PROBLEM LIST: Problem List/Patient Goals Date to be addressed Date deferred Reason deferred Estimated date of resolution  Suicidal ideation 04/19/14     depression 04/19/14     Auditory hallucinations 04/19/14                                          DISCHARGE CRITERIA:  Ability to meet basic life and health needs Adequate post-discharge living arrangements Improved stabilization in mood, thinking, and/or behavior Medical problems require only outpatient monitoring Motivation to continue treatment in a less acute level of care Need for constant or close observation no longer present Reduction of life-threatening or endangering symptoms to within safe limits  PRELIMINARY DISCHARGE PLAN: Outpatient therapy Return to previous living arrangement  PATIENT/FAMIILY INVOLVEMENT: This treatment plan has been presented to and reviewed with the patient, Russell [name], and/or family member.  The patient and family have been given the opportunity to ask questions and make suggestions.  [name] 04/19/2014, 3:54 PM

## 2014-04-19 NOTE — [facility] Group Notes (Signed)
Adult Psychoeducational Group Note  Date:  04/19/2014 Time:  10:08 PM  Group Topic/Focus:  Wellness Toolbox:   The focus of this group is to discuss various aspects of wellness, balancing those aspects and exploring ways to increase the ability to experience wellness.  Patients will create a wellness toolbox for use upon discharge.  Participation Level:  Active  Participation Quality:  Appropriate  Affect:  Appropriate  Cognitive:  Appropriate  Insight: Appropriate  Engagement in Group:  Engaged  Modes of Intervention:  Discussion  Additional Comments:  Pt stated that he had a good day and wasn't depressed and had no thoughts of harming himself or anybody else.  Berlin Hun A 04/19/2014, 10:08 PM

## 2014-04-19 NOTE — Progress Notes (Signed)
D:Patient in hte dayroom on appproach.  Patient states he had a good day.  Patient states he is still trying to get acclimated to the unit and the schedule.  Patient denies SI/HI and denies AVH but verbally contracts for safety.  Patient appears superficial and does not appear to be telling the whole story.  Patient states he does not know why he is here or what happened. A: Staff to monitor Q 15 mins for safety.  Encouragement and support offered.  Scheduled medications administered per orders. R: Patient remains safe on the unit.  Patient attended group tonight.  Patient visible on the unit and interacting with peers.  Patient taking administered medications.

## 2014-04-19 NOTE — Consult Note (Signed)
[facility] Face-to-Face Psychiatry Consult   Reason for Consult: suicide and homicidal ideations Referring Physician:  EDP  Russell [name] is an 43 y.o. male. Total Time spent with patient: 45 minutes  Assessment: AXIS I:  Psychotic Disorder NOS AXIS II:  Deferred AXIS III:   Past Medical History  Diagnosis Date  . Hernia   . Bipolar 1 disorder   . Epilepsy   . Schizophrenia    AXIS IV:  other psychosocial or environmental problems and problems related to social environment AXIS V:  11-20 some danger of hurting self or others possible OR occasionally fails to maintain minimal personal hygiene OR gross impairment in communication  Plan:  Recommend psychiatric Inpatient admission when medically cleared.  Subjective:   Russell [name] is a 43 y.o. male patient admitted with suicide and homicidal ideations  HPI:  43 year old man with history of Epilepsy, Tourette's and bipolar disorder who presents to the ED complaining of few months onset of homicidal and suicidal thoughts. Patient reports that he had a plan to run in front of a truck. He also reports hearing many voices in his head as well as seeing things that other people cannot see. Pt has visible bruising and swelling on his right hand. Pt admits he consumes alcohol several times a week, he drank 3-12oz beers and 1 shot of vodka prior to his arrival at the [facility] yesterday.  Pt says that he currently has larceny charge with a court date of 04/25/14. Pt says he's unsure at times if he's going to harm his wife as he has tried to hit before and he doesn't want to hurt her.    HPI Elements:   Location:  psychosis, suicidal thoughts. Quality:  suicidal thoughts with plan to run in front of a truck. Severity:  recurrent thoughts of suicide. Timing:  since yesterday.  Past Psychiatric History: Past Medical History  Diagnosis Date  . Hernia   . Bipolar 1 disorder   . Epilepsy   . Schizophrenia     reports that he has been  smoking Cigarettes.  He has been smoking about 0.00 packs per day. He has never used smokeless tobacco. He reports that he drinks alcohol. He reports that he does not use illicit drugs. No family history on file. Family History Substance Abuse: No Family Supports: Yes, List: (Spouse ) Living Arrangements: Spouse/significant other;Children Can pt return to current living arrangement?: Yes Abuse/Neglect [facility]) Physical Abuse: Denies Verbal Abuse: Denies Sexual Abuse: Denies Allergies:   Allergies  Allergen Reactions  . Ultram [Tramadol Hcl] Hives    ACT Assessment Complete:  Yes:    Educational Status    Risk to Self: Risk to self with the past 6 months Suicidal Ideation: Yes-Currently Present Suicidal Intent: Yes-Currently Present Is patient at risk for suicide?: Yes Suicidal Plan?: Yes-Currently Present Specify Current Suicidal Plan: "Run in front of truck or car"  Access to Means: Yes Specify Access to Suicidal Means: Vehicular  What has been your use of drugs/alcohol within the last 12 months?: Abusing: alcohol  Previous Attempts/Gestures: No How many times?: 0 Other Self Harm Risks: None  Triggers for Past Attempts: None known Intentional Self Injurious Behavior: None Family Suicide History: No Recent stressful life event(s): Recent negative physical changes (Chronic mental health; Hurt hand after punching wall ) Persecutory voices/beliefs?: No Depression: Yes Depression Symptoms: Loss of interest in usual pleasures Substance abuse history and/or treatment for substance abuse?: No Suicide prevention information given to non-admitted patients: Not applicable  Risk to Others: Risk to Others within the past 6 months Homicidal Ideation: No Thoughts of Harm to Others: No-Not Currently Present/Within Last 6 Months Current Homicidal Intent: No-Not Currently/Within Last 6 Months Current Homicidal Plan: No Access to Homicidal Means: No Identified Victim: None  History of harm  to others?: No Assessment of Violence: None Noted Violent Behavior Description: None  Does patient have access to weapons?: No Criminal Charges Pending?: Yes Describe Pending Criminal Charges: Larceny  Does patient have a court date: Yes Court Date: 04/25/14  Abuse: Abuse/Neglect Assessment (Assessment to be complete while patient is alone) Physical Abuse: Denies Verbal Abuse: Denies Sexual Abuse: Denies Exploitation of patient/patient's resources: Denies Self-Neglect: Denies  Prior Inpatient Therapy: Prior Inpatient Therapy Prior Inpatient Therapy: No Prior Therapy Dates: None  Prior Therapy Facilty/Provider(s): None  Reason for Treatment: None   Prior Outpatient Therapy: Prior Outpatient Therapy Prior Outpatient Therapy: Yes Prior Therapy Dates: Current  Prior Therapy Facilty/Provider(s[facility]  Reason for Treatment: Med Mgt   Additional Information: Additional Information 1:1 In Past 12 Months?: No CIRT Risk: No Elopement Risk: No Does patient have medical clearance?: Yes                  Objective: Blood pressure 106/43, pulse 62, temperature 97 F (36.1 C), temperature source Oral, resp. rate 18, SpO2 99.00%.There is no weight on file to calculate BMI. Results for orders placed during the hospital encounter of 04/18/14 (from the past 72 hour(s))  URINE RAPID DRUG SCREEN (HOSP PERFORMED)     Status: Abnormal   Collection Time    04/18/14  5:37 PM      Result Value Ref Range   Opiates POSITIVE (*) NONE DETECTED   Cocaine NONE DETECTED  NONE DETECTED   Benzodiazepines POSITIVE (*) NONE DETECTED   Amphetamines NONE DETECTED  NONE DETECTED   Tetrahydrocannabinol NONE DETECTED  NONE DETECTED   Barbiturates NONE DETECTED  NONE DETECTED   Comment:            DRUG SCREEN FOR MEDICAL PURPOSES     ONLY.  IF CONFIRMATION IS NEEDED     FOR ANY PURPOSE, NOTIFY LAB     WITHIN 5 DAYS.                LOWEST DETECTABLE LIMITS     FOR  URINE DRUG SCREEN     Drug Class       Cutoff (ng/mL)     Amphetamine      1000     Barbiturate      200     Benzodiazepine   671     Tricyclics       245     Opiates          300     Cocaine          300     THC              50  ACETAMINOPHEN LEVEL     Status: None   Collection Time    04/18/14  5:39 PM      Result Value Ref Range   Acetaminophen (Tylenol), Serum <15.0  10 - 30 ug/mL   Comment:            THERAPEUTIC CONCENTRATIONS VARY     SIGNIFICANTLY. A RANGE OF 10-30     ug/mL MAY BE AN EFFECTIVE     CONCENTRATION FOR MANY PATIENTS.     HOWEVER, SOME  ARE BEST TREATED     AT CONCENTRATIONS OUTSIDE THIS     RANGE.     ACETAMINOPHEN CONCENTRATIONS     >150 ug/mL AT 4 HOURS AFTER     INGESTION AND >50 ug/mL AT 12     HOURS AFTER INGESTION ARE     OFTEN ASSOCIATED WITH TOXIC     REACTIONS.  CBC     Status: None   Collection Time    04/18/14  5:39 PM      Result Value Ref Range   WBC 9.2  4.0 - 10.5 K/uL   RBC 5.09  4.22 - 5.81 MIL/uL   Hemoglobin 15.7  13.0 - 17.0 g/dL   HCT 45.4  39.0 - 52.0 %   MCV 89.2  78.0 - 100.0 fL   MCH 30.8  26.0 - 34.0 pg   MCHC 34.6  30.0 - 36.0 g/dL   RDW 14.3  11.5 - 15.5 %   Platelets 264  150 - 400 K/uL  COMPREHENSIVE METABOLIC PANEL     Status: Abnormal   Collection Time    04/18/14  5:39 PM      Result Value Ref Range   Sodium 140  137 - 147 mEq/L   Potassium 3.8  3.7 - 5.3 mEq/L   Chloride 100  96 - 112 mEq/L   CO2 24  19 - 32 mEq/L   Glucose, Bld 110 (*) 70 - 99 mg/dL   BUN 8  6 - 23 mg/dL   Creatinine, Ser 1.02  0.50 - 1.35 mg/dL   Calcium 9.5  8.4 - 10.5 mg/dL   Total Protein 7.9  6.0 - 8.3 g/dL   Albumin 4.0  3.5 - 5.2 g/dL   AST 30  0 - 37 U/L   ALT 34  0 - 53 U/L   Alkaline Phosphatase 110  39 - 117 U/L   Total Bilirubin <0.2 (*) 0.3 - 1.2 mg/dL   GFR calc non Af Amer 89 (*) >90 mL/min   GFR calc Af Amer >90  >90 mL/min   Comment: (NOTE)     The eGFR has been calculated using the CKD EPI equation.     This  calculation has not been validated in all clinical situations.     eGFR's persistently <90 mL/min signify possible Chronic Kidney     Disease.   Anion gap 16 (*) 5 - 15  ETHANOL     Status: Abnormal   Collection Time    04/18/14  5:39 PM      Result Value Ref Range   Alcohol, Ethyl (B) 171 (*) 0 - 11 mg/dL   Comment:            LOWEST DETECTABLE LIMIT FOR     SERUM ALCOHOL IS 11 mg/dL     FOR MEDICAL PURPOSES ONLY  SALICYLATE LEVEL     Status: Abnormal   Collection Time    04/18/14  5:39 PM      Result Value Ref Range   Salicylate Lvl <4.1 (*) 2.8 - 20.0 mg/dL  VALPROIC ACID LEVEL     Status: Abnormal   Collection Time    04/18/14  5:39 PM      Result Value Ref Range   Valproic Acid Lvl 16.6 (*) 50.0 - 100.0 ug/mL   Comment: Performed at [facility] are reviewed and are pertinent for opiates and benzodiazepine positive  Current Facility-Administered Medications  Medication Dose Route Frequency Provider Last Rate  Last Dose  . alum & mag hydroxide-simeth (MAALOX/MYLANTA) 200-200-20 MG/5ML suspension 30 mL  30 mL Oral PRN Carlisle Cater, PA-C   30 mL at 04/18/14 2002  . benztropine (COGENTIN) tablet 0.5 mg  0.5 mg Oral QHS Carlisle Cater, PA-C      . divalproex (DEPAKOTE ER) 24 hr tablet 1,000 mg  1,000 mg Oral QHS Carlisle Cater, PA-C   1,000 mg at 04/18/14 2201  . divalproex (DEPAKOTE ER) 24 hr tablet 500 mg  500 mg Oral Daily Carlisle Cater, PA-C      . ibuprofen (ADVIL,MOTRIN) tablet 600 mg  600 mg Oral Q8H PRN Carlisle Cater, PA-C   600 mg at 04/18/14 2002  . imipramine (TOFRANIL) tablet 100 mg  100 mg Oral QHS Carlisle Cater, PA-C   100 mg at 04/18/14 2200  . imipramine (TOFRANIL) tablet 50 mg  50 mg Oral Daily Carlisle Cater, PA-C      . LORazepam (ATIVAN) tablet 1 mg  1 mg Oral Q8H PRN Carlisle Cater, PA-C   1 mg at 04/18/14 2002  . nicotine (NICODERM CQ - dosed in mg/24 hours) patch 21 mg  21 mg Transdermal Daily Carlisle Cater, PA-C      . ondansetron (ZOFRAN)  tablet 4 mg  4 mg Oral Q8H PRN Carlisle Cater, PA-C      . pantoprazole (PROTONIX) EC tablet 40 mg  40 mg Oral Daily Carlisle Cater, PA-C   40 mg at 04/18/14 2201  . prazosin (MINIPRESS) capsule 2 mg  2 mg Oral QHS Carlisle Cater, PA-C   2 mg at 04/18/14 2201   Current Outpatient Prescriptions  Medication Sig Dispense Refill  . alprazolam (XANAX) 2 MG tablet Take 2 mg by mouth 3 (three) times daily as needed for anxiety.       . benztropine (COGENTIN) 0.5 MG tablet Take 0.5 mg by mouth at bedtime.      . divalproex (DEPAKOTE ER) 500 MG 24 hr tablet Take 500-1,000 mg by mouth 2 (two) times daily. Take 1 tablet (500 mg) every morning and 2 tablets (1000 mg) at bedtime      . HYDROcodone-acetaminophen (NORCO/VICODIN) 5-325 MG per tablet Take 1 tablet by mouth every 6 (six) hours as needed for moderate pain or severe pain.      Marland Kitchen imipramine (TOFRANIL) 50 MG tablet Take 50-100 mg by mouth 2 (two) times daily. Take 1 tablet (50 mg) every morning and 2 tablets (100 mg) every night      . omeprazole (PRILOSEC) 40 MG capsule Take 80 mg by mouth daily.      . prazosin (MINIPRESS) 2 MG capsule Take 2 mg by mouth at bedtime. For nightmares        Psychiatric Specialty Exam: Physical Exam  Review of Systems  Constitutional: Negative.   HENT: Negative.   Eyes: Negative.   Respiratory: Negative.   Cardiovascular: Negative.   Gastrointestinal: Negative.   Genitourinary: Negative.   Musculoskeletal: Negative.   Skin: Negative.   Neurological: Negative.   Endo/Heme/Allergies: Negative.   Psychiatric/Behavioral: Positive for depression, suicidal ideas, hallucinations and substance abuse. The patient is nervous/anxious.     Blood pressure 106/43, pulse 62, temperature 97 F (36.1 C), temperature source Oral, resp. rate 18, SpO2 99.00%.There is no weight on file to calculate BMI.  General Appearance: Disheveled  Eye Contact::  Minimal  Speech:  Slow and soft  Volume:  Decreased  Mood:  Depressed and  Dysphoric  Affect:  Constricted  Thought Process:  Goal Directed  Orientation:  Full (Time, Place, and Person)  Thought Content:  Hallucinations: Auditory Visual  Suicidal Thoughts:  Yes.  with intent/plan  Homicidal Thoughts:  Yes.  without intent/plan  Memory:  Immediate;   Fair Recent;   Fair Remote;   Fair  Judgement:  Impaired  Insight:  Shallow  Psychomotor Activity:  Decreased  Concentration:  Fair  Recall:  [employer] of Knowledge:Fair  Language: Good  Akathisia:  No  Handed:  Right  AIMS (if indicated):     Assets:  Communication Skills Desire for Improvement Physical Health  Sleep:      Musculoskeletal: Strength & Muscle Tone: within normal limits Gait & Station: normal Patient leans: N/A  Treatment Plan Summary: Daily contact with patient to assess and evaluate symptoms and progress in treatment Medication management recommends inpatient treatment for psychosis and suicidal thoughts  Corena Pilgrim, MD 04/19/2014 11:24 AM

## 2014-04-19 NOTE — Progress Notes (Signed)
Admission note: Patient admitted for allegedly being suicidal with a plan to run out into traffic and for hearing voices. Patient denies suicidality now and denies auditory hallucinations. He says that he was talking to his doctor's office on the phone and the next thing he knew the police were pulling up at his house and brought him to the ED. He says he does not remember telling anyone he was suicidal and that he feels perfectly fine now. He does say that he has Tourette's, epilepsy, schizophrenia, and bipolar disorder. He says that he is unemployed, only went to the eighth grade in school, and does not have a driver's license. He says that he blacks out sometimes and does not remember things. Patient denies any substance abuse, says he uses no drugs and only drinks and uses tobacco when there is a game on TV. He says he has a psychiatrist named Dr. Lance Coon and that he is followed by [facility] in [place], [place]. He says that he was dropped as a patient for missing one appointment due to not having a driver's license. Will continue to monitor for safety. [name], RN

## 2014-04-19 NOTE — Plan of Care (Signed)
Problem: Alteration in mood Goal: LTG-Patient reports reduction in suicidal thoughts (Patient reports reduction in suicidal thoughts and is able to verbalize a safety plan for whenever patient is feeling suicidal)  Outcome: Progressing Patient denies Suicidal ideations Goal: STG-Patient is able to discuss feelings and issues (Patient is able to discuss feelings and issues leading to depression)  Outcome: Not Met (add Reason) Patient has been superficial an states he does not know why he is here.

## 2014-04-20 DIAGNOSIS — F200 Paranoid schizophrenia: Secondary | ICD-10-CM

## 2014-04-20 MED ORDER — DICYCLOMINE HCL 20 MG PO TABS
20.0000 mg | ORAL_TABLET | Freq: Four times a day (QID) | ORAL | Status: AC | PRN
  Administered 2014-04-20: 20 mg via ORAL
  Filled 2014-04-20: qty 1

## 2014-04-20 MED ORDER — CHLORDIAZEPOXIDE HCL 25 MG PO CAPS
25.0000 mg | ORAL_CAPSULE | Freq: Four times a day (QID) | ORAL | Status: AC
  Administered 2014-04-20 – 2014-04-21 (×4): 25 mg via ORAL
  Filled 2014-04-20 (×4): qty 1

## 2014-04-20 MED ORDER — CHLORDIAZEPOXIDE HCL 25 MG PO CAPS
25.0000 mg | ORAL_CAPSULE | Freq: Three times a day (TID) | ORAL | Status: AC
  Administered 2014-04-21 – 2014-04-22 (×3): 25 mg via ORAL
  Filled 2014-04-20 (×3): qty 1

## 2014-04-20 MED ORDER — THIAMINE HCL 100 MG/ML IJ SOLN: 100.0000 mg | Freq: Once | INTRAMUSCULAR | Status: DC

## 2014-04-20 MED ORDER — NAPROXEN 500 MG PO TABS
500.0000 mg | ORAL_TABLET | Freq: Two times a day (BID) | ORAL | Status: AC | PRN
  Administered 2014-04-20 – 2014-04-22 (×4): 500 mg via ORAL
  Filled 2014-04-20 (×4): qty 1

## 2014-04-20 MED ORDER — ONDANSETRON 4 MG PO TBDP: 4.0000 mg | ORAL_TABLET | Freq: Four times a day (QID) | ORAL | Status: AC | PRN

## 2014-04-20 MED ORDER — METHOCARBAMOL 500 MG PO TABS
500.0000 mg | ORAL_TABLET | Freq: Three times a day (TID) | ORAL | Status: AC | PRN
  Administered 2014-04-20 – 2014-04-23 (×3): 500 mg via ORAL
  Filled 2014-04-20 (×3): qty 1

## 2014-04-20 MED ORDER — ADULT MULTIVITAMIN W/MINERALS CH: 1.0000 | ORAL_TABLET | Freq: Every day | ORAL | Status: DC

## 2014-04-20 MED ORDER — CHLORDIAZEPOXIDE HCL 25 MG PO CAPS
25.0000 mg | ORAL_CAPSULE | ORAL | Status: AC
  Administered 2014-04-22 – 2014-04-23 (×2): 25 mg via ORAL
  Filled 2014-04-20 (×2): qty 1

## 2014-04-20 MED ORDER — HYDROXYZINE HCL 25 MG PO TABS: 25.0000 mg | ORAL_TABLET | Freq: Four times a day (QID) | ORAL | Status: AC | PRN

## 2014-04-20 MED ORDER — CLONIDINE HCL 0.1 MG PO TABS: 0.1000 mg | ORAL_TABLET | Freq: Every day | ORAL | Status: DC

## 2014-04-20 MED ORDER — LOPERAMIDE HCL 2 MG PO CAPS: 2.0000 mg | ORAL_CAPSULE | ORAL | Status: DC | PRN

## 2014-04-20 MED ORDER — ADULT MULTIVITAMIN W/MINERALS CH
1.0000 | ORAL_TABLET | Freq: Every day | ORAL | Status: DC
  Administered 2014-04-20 – 2014-04-25 (×6): 1 via ORAL
  Filled 2014-04-20 (×8): qty 1

## 2014-04-20 MED ORDER — CLONIDINE HCL 0.1 MG PO TABS: 0.1000 mg | ORAL_TABLET | ORAL | Status: DC

## 2014-04-20 MED ORDER — CLONIDINE HCL 0.1 MG PO TABS
0.1000 mg | ORAL_TABLET | ORAL | Status: AC
  Administered 2014-04-22 – 2014-04-24 (×4): 0.1 mg via ORAL
  Filled 2014-04-20 (×4): qty 1

## 2014-04-20 MED ORDER — ADULT MULTIVITAMIN W/MINERALS CH
1.0000 | ORAL_TABLET | Freq: Every day | ORAL | Status: DC
  Filled 2014-04-20 (×2): qty 1

## 2014-04-20 MED ORDER — LOPERAMIDE HCL 2 MG PO CAPS
2.0000 mg | ORAL_CAPSULE | ORAL | Status: AC | PRN
  Administered 2014-04-20: 4 mg via ORAL
  Filled 2014-04-20: qty 2

## 2014-04-20 MED ORDER — VITAMIN B-1 100 MG PO TABS
100.0000 mg | ORAL_TABLET | Freq: Every day | ORAL | Status: DC
  Filled 2014-04-20: qty 1

## 2014-04-20 MED ORDER — CHLORDIAZEPOXIDE HCL 25 MG PO CAPS
25.0000 mg | ORAL_CAPSULE | Freq: Every day | ORAL | Status: AC
  Administered 2014-04-24: 25 mg via ORAL
  Filled 2014-04-20: qty 1

## 2014-04-20 MED ORDER — CLONIDINE HCL 0.1 MG PO TABS
0.1000 mg | ORAL_TABLET | Freq: Every day | ORAL | Status: DC
  Administered 2014-04-25: 0.1 mg via ORAL
  Filled 2014-04-20 (×2): qty 1

## 2014-04-20 MED ORDER — HYDROXYZINE HCL 25 MG PO TABS: 25.0000 mg | ORAL_TABLET | Freq: Four times a day (QID) | ORAL | Status: DC | PRN

## 2014-04-20 MED ORDER — VITAMIN B-1 100 MG PO TABS
100.0000 mg | ORAL_TABLET | Freq: Every day | ORAL | Status: DC
  Administered 2014-04-20 – 2014-04-25 (×6): 100 mg via ORAL
  Filled 2014-04-20 (×8): qty 1

## 2014-04-20 MED ORDER — CHLORDIAZEPOXIDE HCL 25 MG PO CAPS: 25.0000 mg | ORAL_CAPSULE | Freq: Four times a day (QID) | ORAL | Status: DC | PRN

## 2014-04-20 MED ORDER — ONDANSETRON 4 MG PO TBDP: 4.0000 mg | ORAL_TABLET | Freq: Four times a day (QID) | ORAL | Status: DC | PRN

## 2014-04-20 MED ORDER — VITAMIN B-1 100 MG PO TABS: 100.0000 mg | ORAL_TABLET | Freq: Every day | ORAL | Status: DC

## 2014-04-20 MED ORDER — CLONIDINE HCL 0.1 MG PO TABS
0.1000 mg | ORAL_TABLET | Freq: Four times a day (QID) | ORAL | Status: AC
  Administered 2014-04-20 – 2014-04-22 (×8): 0.1 mg via ORAL
  Filled 2014-04-20 (×9): qty 1

## 2014-04-20 MED ORDER — CHLORDIAZEPOXIDE HCL 25 MG PO CAPS
25.0000 mg | ORAL_CAPSULE | Freq: Four times a day (QID) | ORAL | Status: AC | PRN
Start: 2014-04-20 — End: 2014-04-23

## 2014-04-20 MED ORDER — CLONIDINE HCL 0.1 MG PO TABS: 0.1000 mg | ORAL_TABLET | Freq: Four times a day (QID) | ORAL | Status: DC

## 2014-04-20 NOTE — [facility] Counselor (Signed)
Adult Comprehensive Assessment  Patient ID: Russell [name], male   DOB: 05-08-1971, 43 y.o.   MRN: [id number]  Information Source: Information source: Patient  Current Stressors:  Educational / Learning stressors: N/A Employment / Job issues: N/A Family Relationships: N/A Surveyor, quantity / Lack of resources (include bankruptcy): N/A Housing / Lack of housing: N/A Physical health (include injuries & life threatening diseases): blackout and seizures; Diagnosed epilepsy 2 years ago Social relationships: Isolates self, lacks strong social support system Substance abuse: Pain medications 5 mg Hydrocodone 3 times a day, on and off pain medications for 20 years Bereavement / Loss: N/A  Living/Environment/Situation:  Living Arrangements: Spouse/significant other;Children How long has patient lived in current situation?: 11 years What is atmosphere in current home: Comfortable;Supportive  Family History:  Marital status: Married Number of Years Married: 11 What types of issues is patient dealing with in the relationship?: N/A Does patient have children?: Yes How many children?: 2 How is patient's relationship with their children?: "okay with both"  Childhood History:  By whom was/is the patient raised?: Both parents Description of patient's relationship with caregiver when they were a child: Not close with parents, spent most of his days in athletic sports Patient's description of current relationship with people who raised him/her: Good relationship with parents Does patient have siblings?: Yes Number of Siblings: 2 Description of patient's current relationship with siblings: gets along with both but distant relationship Did patient suffer any verbal/emotional/physical/sexual abuse as a child?: No Did patient suffer from severe childhood neglect?: No Has patient ever been sexually abused/assaulted/raped as an adolescent or adult?: No Was the patient ever a victim of a crime or a  disaster?: No Witnessed domestic violence?: No Has patient been effected by domestic violence as an adult?: No  Education:  Highest grade of school patient has completed: 8th grade Currently a student?: No Learning disability?: Yes What learning problems does patient have?: ADHD  Employment/Work Situation:   Employment situation: Employed 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 How long has patient been employed?: 1 year Patient's job has been impacted by current illness: Yes Describe how patient's job has been impacted: Lost previous job due to seizures What is the longest time patient has a held a job?: 8 years 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 Has patient ever been in the [employer]?: No Has patient ever served in combat?: No  Financial Resources:   Financial resources: Income from employment;Income from spouse Does patient have a representative payee or guardian?: No  Alcohol/Substance Abuse:   What has been your use of drugs/alcohol within the last 12 months?: Issues with pain medications and suboxone  If attempted suicide, did drugs/alcohol play a role in this?:  (Unknown, patient reports that he does not remember the day that he was hospitalized) Alcohol/Substance Abuse Treatment Hx: Denies past history Has alcohol/substance abuse ever caused legal problems?: Yes (DUI when 33)  Social Support System:   Conservation officer, nature Support System: Fair Museum/gallery exhibitions officer System: Mom, dad, wife Type of faith/religion: Uknown How does patient's faith help to cope with current illness?: Unknown  Leisure/Recreation:   Leisure and Hobbies: Systems developer, run, exercise  Strengths/Needs:   What things does the patient do well?: Sports, athletic  In what areas does patient struggle / problems for patient: being overmedicated; epilepsy  Discharge Plan:   Does patient have access to transportation?: Yes Will patient be  returning to same living situation after discharge?: Yes Currently receiving community mental health  services: Yes (From Whom) (Psychiatrist Dr. Gerome Sam and Counselor Misty Stanley at [facility]) If no, would patient like referral for services when discharged?: No Does patient have financial barriers related to discharge medications?: No  Summary/Recommendations:     Patient is a 43 year old Caucasian Male with a diagnosis of Opioid dependence, Benzodiazepine dependence, Hx paranoid schizophrenia. Patient lives in [place] with his wife and 32 year old daughter. Patient will benefit from crisis stabilization, medication evaluation, group therapy, and psycho education in addition to case management for discharge planning.    Russell [name], [place] 04/20/2014

## 2014-04-20 NOTE — Progress Notes (Signed)
D:  Patient has been in bed most of the day.  He is not attending groups.  He rates depression at a 2, hopelessness and anxiety at a 2.  He denies any thoughts of suicide or self harm.   A:  Medications given as ordered.  Patient educated on the PRN medications that are available to him for withdrawal type symptoms.   R:  Cooperative with staff.  Stays in bed much of the time.  Verbalized understanding of information given about medications.

## 2014-04-20 NOTE — Plan of Care (Signed)
Problem: Ineffective individual coping Goal: STG: Patient will remain free from self harm Outcome: Progressing Patient denies SI at this time.  Patient states he will speak with a staff member if it changes. Goal: STG: Patient will participate in after care plan Outcome: Not Met (add Reason) Patient new admit and unsure at this time but it is in process. Goal: STG-Increase in ability to manage activities of daily living Outcome: Not Met (add Reason) Patient new admit at this time and will try again tomorrow.

## 2014-04-20 NOTE — Progress Notes (Signed)
Adult Psychoeducational Group Note  Date:  04/20/2014 Time:  11:10 PM  Group Topic/Focus:  Wrap-Up Group:   The focus of this group is to help patients review their daily goal of treatment and discuss progress on daily workbooks.  Participation Level:  Did Not Attend  Participation Quality:  Drowsy  Affect:  Flat  Cognitive:  Confused  Insight: None  Engagement in Group:  None  Modes of Intervention:  None   Additional Comments:  Pt was not in [employer], Azaya Goedde R 04/20/2014, 11:10 PM

## 2014-04-20 NOTE — [facility] Group Notes (Signed)
[facility] LCSW Group Therapy 04/20/2014  1:15 PM   Type of Therapy: Group Therapy  Participation Level: Did Not Attend.   Lakiya Cottam, MSW, LCSWA Clinical Social Worker [facility] [phone]   

## 2014-04-20 NOTE — Progress Notes (Signed)
psychoed Adult Psychoeducational Group Note  Date:  04/20/2014 Time:  10:00am Group Topic/Focus:  Personal Choices and Values:   The focus of this group is to help patients assess and explore the importance of values in their lives, how their values affect their decisions, how they express their values and what opposes their expression.  Participation Level:  Did Not Attend  Participation Quality:    Affect:   Cognitive:    Insight:  Engagement in Group:    Modes of Intervention:    Additional Comments:  Pt did not attend  Pryor Curia 04/20/2014, 1:12 PM

## 2014-04-20 NOTE — Clinical Social Work Note (Signed)
CSW attempted to complete PSA, patient in bed stating that he does not feel well at this time. CSW will complete PSA at later time.  Samuella Bruin, MSW, [employer] Clinical Social Worker [facility] [phone]

## 2014-04-20 NOTE — H&P (Addendum)
Psychiatric Admission Assessment Adult  Patient Identification:  Russell [name]  Date of Evaluation:  04/20/2014  Chief Complaint:  MOOD DISORDER NOS  History of Present Illness: Russell [name] is 43 year old, Caucasian male. He reports, "I don't know, but I have been feeling very emotional lately. It could due to build up of things. I have been dealing with pain issues associated with a reconstruction of my right ankle. I also have a epilepsy. I lost my driving license, been unemployed because of my ill health. I am unable to support my family.  I have been on pain medicines and Xanax pills for my pain and anxiety problems. Xanax helps me with my sleep. They work well for me. I had tried to stop the pain pills, but got very sick with the withdrawals. I had tried pain clinic for my pain management as well. I don't remember much. I blackout a lot. I don't think I have alcohol problems. Although I can't remember when I drank last. I know, my wife and I do have some mixed cocktail drinks from time to time. All I know is that I have been feeling very depressed and suicidal. Never attempted suicide. This is unlike me, but I was charged with larceny with a pending court date on 04/25/14".  O: Patient was interviewed and assessed while lying down in his bed in his hospital room. He is alert and oriented to name and place. However, appears to be forgetful, guarded and gives vague answers to questions. He is not coming up forth with useful information. He says he either does not remember or he has forgotten. He says he blacks out a lot. Does not know how to differentiate his blackouts from his issues with seizure disorder. He states that he does not have problems with alcoholism, and yet drinks from time to time. His BAL on admission was 171, and yet does not remember drinking any alcohol. Unable to relate if seizure activities is coming from alcoholism or true seizure disorder.  Elements:  Location:   Opioid/benzodiazepine dependence, Hallucinations. Quality:  worsening symptoms of depression, suicidal ideations with plans, auditory hallucinations. Severity:  Severe, "I was planning on jumping in front of a moving truck". Timing:  "Worsening depression/anxiety x few months". Duration:  Chronic. Context:  "I have been feeling very emotional due to build of things, I have a court date coming up 04/25/14 for larceny".  Associated Signs/Symptoms:  Depression Symptoms:  depressed mood, anxiety,  (Hypo) Manic Symptoms:  Hallucinations, (visual/auditory)  Anxiety Symptoms:  Excessive Worry,  Psychotic Symptoms:  Hallucinations: Hx auditory hallucinations (last heard 04/19/14)  PTSD Symptoms: NA  Total Time spent with patient: 1 hour  Psychiatric Specialty Exam: Physical Exam  Constitutional: He is oriented to person, place, and time. He appears well-developed and well-nourished.  HENT:  Head: Normocephalic.  Eyes: Pupils are equal, round, and reactive to light.  Neck: Normal range of motion.  Cardiovascular: Normal rate.   Respiratory: Effort normal.  GI: Soft.  Genitourinary:  Denies any issues in this area   Musculoskeletal: Normal range of motion.  Neurological: He is alert and oriented to person, place, and time.  Skin: Skin is warm and dry.  Psychiatric: His speech is normal. Judgment normal. His mood appears anxious. His affect is not angry, not blunt, not labile and not inappropriate. He is actively hallucinating (auditory/visual). Thought content is paranoid. Cognition and memory are normal. He exhibits a depressed mood.    Review of Systems  Constitutional: Negative.  HENT: Negative.   Eyes: Negative.   Respiratory: Negative.   Cardiovascular: Negative.   Gastrointestinal: Negative.   Musculoskeletal: Negative.   Skin: Negative.   Neurological: Negative.   Psychiatric/Behavioral: Positive for depression and hallucinations. Negative for suicidal ideas, memory  loss and substance abuse. The patient is nervous/anxious and has insomnia.     Blood pressure 139/88, pulse 92, temperature 98.2 F (36.8 C), temperature source Oral, resp. rate 18, height _0  (1.854 m), weight 92.987 kg (205 lb).Body mass index is 27.05 kg/(m^2).  General Appearance: Disheveled, guarded  Eye Contact::  Fair  Speech:  Clear and Coherent  Volume:  Normal  Mood:  Anxious and Depressed, guarded  Affect:  Congruent  Thought Process:  Circumstantial and Tangential  Orientation:  Other:  Oriental to self and place, disoriented to time/day & Situation  Thought Content:  Hallucinations: Auditory and Rumination  Suicidal Thoughts:  No, but present on admission  Homicidal Thoughts:  No  Memory:  Immediate;   Fair Recent;   Poor Remote;   Poor  Judgement:  Impaired  Insight:  Lacking  Psychomotor Activity:  Tremor  Concentration:  Fair  Recall:  Poor  Fund of Knowledge:Poor  Language: Fair  Akathisia:  No  Handed:  Right  AIMS (if indicated):     Assets:  Communication Skills Desire for Improvement  Sleep:  Number of Hours: 5.25   Musculoskeletal: Strength & Muscle Tone: within normal limits Gait & Station: normal Patient leans: N/A  Past Psychiatric History: Diagnosis: Opioid dependence, Benzodiazepine dependence, Hx paranoid schizophrenia  Hospitalizations: [facility] adult unit  Outpatient Care: With dr, Matt Holmes  Substance Abuse Care: None reported  Self-Mutilation: NA  Suicidal Attempts:  Denies attempts,admits thoughts  Violent Behaviors: NA   Past Medical History:   Past Medical History  Diagnosis Date  . Hernia   . Bipolar 1 disorder   . Epilepsy   . Schizophrenia    Seizure History:  Hx. epilepsy  Allergies:   Allergies  Allergen Reactions  . Ultram [Tramadol Hcl] Hives   PTA Medications: Prescriptions prior to admission  Medication Sig Dispense Refill  . alprazolam (XANAX) 2 MG tablet Take 2 mg by mouth 3 (three) times daily as needed for  anxiety.       . benztropine (COGENTIN) 0.5 MG tablet Take 0.5 mg by mouth at bedtime.      . divalproex (DEPAKOTE ER) 500 MG 24 hr tablet Take 500-1,000 mg by mouth 2 (two) times daily. Take 1 tablet (500 mg) every morning and 2 tablets (1000 mg) at bedtime      . HYDROcodone-acetaminophen (NORCO/VICODIN) 5-325 MG per tablet Take 1 tablet by mouth every 6 (six) hours as needed for moderate pain or severe pain.      Marland Kitchen imipramine (TOFRANIL) 50 MG tablet Take 50-100 mg by mouth 2 (two) times daily. Take 1 tablet (50 mg) every morning and 2 tablets (100 mg) every night      . omeprazole (PRILOSEC) 40 MG capsule Take 80 mg by mouth daily.      . prazosin (MINIPRESS) 2 MG capsule Take 2 mg by mouth at bedtime. For nightmares       Previous Psychotropic Medications: Medication/Dose  See medication lists               Substance Abuse History in the last 12 months:  Yes.    Consequences of Substance Abuse: Medical Consequences:  Liver damage, Possible death by overdose Legal Consequences:  Arrests, jail time,  Loss of driving privilege. Family Consequences:  Family discord, divorce and or separation.  Social History:  reports that he has been smoking Cigarettes.  He has been smoking about 0.00 packs per day. He has never used smokeless tobacco. He reports that he drinks alcohol. He reports that he does not use illicit drugs. Additional Social History: Current Place of Residence: [place], [place] of Birth: [place], [place]  Family Members: "wife & daughter"  Marital Status:  Married  Children: 1  Sons: 0  Daughters: 1  Relationships: Married  Education:  No high school diploma  Educational Problems/Performance: Did not complete high school  Religious Beliefs/Practices: NA  History of Abuse (Emotional/Phsycial/Sexual): None reported  Occupational Experiences: Medical laboratory scientific officer History:  None.  Legal History: Pending court date for stealing  Hobbies/Interests:  NA  Family History:  History reviewed. No pertinent family history.  Results for orders placed during the hospital encounter of 04/18/14 (from the past 72 hour(s))  URINE RAPID DRUG SCREEN (HOSP PERFORMED)     Status: Abnormal   Collection Time    04/18/14  5:37 PM      Result Value Ref Range   Opiates POSITIVE (*) NONE DETECTED   Cocaine NONE DETECTED  NONE DETECTED   Benzodiazepines POSITIVE (*) NONE DETECTED   Amphetamines NONE DETECTED  NONE DETECTED   Tetrahydrocannabinol NONE DETECTED  NONE DETECTED   Barbiturates NONE DETECTED  NONE DETECTED   Comment:            DRUG SCREEN FOR MEDICAL PURPOSES     ONLY.  IF CONFIRMATION IS NEEDED     FOR ANY PURPOSE, NOTIFY LAB     WITHIN 5 DAYS.                LOWEST DETECTABLE LIMITS     FOR URINE DRUG SCREEN     Drug Class       Cutoff (ng/mL)     Amphetamine      1000     Barbiturate      200     Benzodiazepine   144     Tricyclics       818     Opiates          300     Cocaine          300     THC              50  ACETAMINOPHEN LEVEL     Status: None   Collection Time    04/18/14  5:39 PM      Result Value Ref Range   Acetaminophen (Tylenol), Serum <15.0  10 - 30 ug/mL   Comment:            THERAPEUTIC CONCENTRATIONS VARY     SIGNIFICANTLY. A RANGE OF 10-30     ug/mL MAY BE AN EFFECTIVE     CONCENTRATION FOR MANY PATIENTS.     HOWEVER, SOME ARE BEST TREATED     AT CONCENTRATIONS OUTSIDE THIS     RANGE.     ACETAMINOPHEN CONCENTRATIONS     >150 ug/mL AT 4 HOURS AFTER     INGESTION AND >50 ug/mL AT 12     HOURS AFTER INGESTION ARE     OFTEN ASSOCIATED WITH TOXIC     REACTIONS.  CBC     Status: None   Collection Time    04/18/14  5:39 PM      Result  Value Ref Range   WBC 9.2  4.0 - 10.5 K/uL   RBC 5.09  4.22 - 5.81 MIL/uL   Hemoglobin 15.7  13.0 - 17.0 g/dL   HCT 45.4  39.0 - 52.0 %   MCV 89.2  78.0 - 100.0 fL   MCH 30.8  26.0 - 34.0 pg   MCHC 34.6  30.0 - 36.0 g/dL   RDW 14.3  11.5 - 15.5 %   Platelets 264   150 - 400 K/uL  COMPREHENSIVE METABOLIC PANEL     Status: Abnormal   Collection Time    04/18/14  5:39 PM      Result Value Ref Range   Sodium 140  137 - 147 mEq/L   Potassium 3.8  3.7 - 5.3 mEq/L   Chloride 100  96 - 112 mEq/L   CO2 24  19 - 32 mEq/L   Glucose, Bld 110 (*) 70 - 99 mg/dL   BUN 8  6 - 23 mg/dL   Creatinine, Ser 1.02  0.50 - 1.35 mg/dL   Calcium 9.5  8.4 - 10.5 mg/dL   Total Protein 7.9  6.0 - 8.3 g/dL   Albumin 4.0  3.5 - 5.2 g/dL   AST 30  0 - 37 U/L   ALT 34  0 - 53 U/L   Alkaline Phosphatase 110  39 - 117 U/L   Total Bilirubin <0.2 (*) 0.3 - 1.2 mg/dL   GFR calc non Af Amer 89 (*) >90 mL/min   GFR calc Af Amer >90  >90 mL/min   Comment: (NOTE)     The eGFR has been calculated using the CKD EPI equation.     This calculation has not been validated in all clinical situations.     eGFR's persistently <90 mL/min signify possible Chronic Kidney     Disease.   Anion gap 16 (*) 5 - 15  ETHANOL     Status: Abnormal   Collection Time    04/18/14  5:39 PM      Result Value Ref Range   Alcohol, Ethyl (B) 171 (*) 0 - 11 mg/dL   Comment:            LOWEST DETECTABLE LIMIT FOR     SERUM ALCOHOL IS 11 mg/dL     FOR MEDICAL PURPOSES ONLY  SALICYLATE LEVEL     Status: Abnormal   Collection Time    04/18/14  5:39 PM      Result Value Ref Range   Salicylate Lvl <1.6 (*) 2.8 - 20.0 mg/dL  VALPROIC ACID LEVEL     Status: Abnormal   Collection Time    04/18/14  5:39 PM      Result Value Ref Range   Valproic Acid Lvl 16.6 (*) 50.0 - 100.0 ug/mL   Comment: Performed at [facility]   Psychological Evaluations:  Assessment:   DSM5: Schizophrenia Disorders: Hx. Paranoid schizophrenia Obsessive-Compulsive Disorders:  NA Trauma-Stressor Disorders:  NA Substance/Addictive Disorders:  Alcohol Related Disorder - Moderate (303.90) and Opioid Disorder - Severe (304.00), Benzodiazepine dependence Depressive Disorders:  Major Depressive Disorder - Severe  (296.23)  AXIS I:  Opioid dependence, Benzodiazepine dependence, Hx paranoid schizophrenia AXIS II:  Deferred AXIS III:   Past Medical History  Diagnosis Date  . Hernia   . Bipolar 1 disorder   . Epilepsy   . Schizophrenia    AXIS IV:  other psychosocial or environmental problems and polysubstance dependence, mental illness, chronic AXIS V:  21-30 behavior  considerably influenced by delusions or hallucinations OR serious impairment in judgment, communication OR inability to function in almost all areas  Treatment Plan/Recommendations: 1. Admit for crisis management and stabilization, estimated length of stay 3-5 days.  2. Medication management to reduce current symptoms to base line and improve the patient's overall level of functioning, initiate both clonidine & Librium detox protocols for both opioid/Benzodiazepine detox treatments. 3. Treat health problems as indicated.  4. Develop treatment plan to decrease risk of relapse upon discharge and the need for readmission.  5. Psycho-social education regarding relapse prevention and self care.  6. Health care follow up as needed for medical problems.  7. Review, reconcile, and reinstate any pertinent home medications for other health issues where appropriate. 8. Call for consults with hospitalist for any additional specialty patient care services as needed.  Treatment Plan Summary: Daily contact with patient to assess and evaluate symptoms and progress in treatment Medication management  Current Medications:  Current Facility-Administered Medications  Medication Dose Route Frequency Provider Last Rate Last Dose  . acetaminophen (TYLENOL) tablet 650 mg  650 mg Oral Q6H PRN Elmarie Shiley, NP      . alum & mag hydroxide-simeth (MAALOX/MYLANTA) 200-200-20 MG/5ML suspension 30 mL  30 mL Oral Q4H PRN Elmarie Shiley, NP   30 mL at 04/19/14 2212  . chlordiazePOXIDE (LIBRIUM) capsule 25 mg  25 mg Oral Q6H PRN Elmarie Shiley, NP   25 mg at 04/20/14 0848   . divalproex (DEPAKOTE) DR tablet 500 mg  500 mg Oral BID WC Elmarie Shiley, NP   500 mg at 04/20/14 0846  . magnesium hydroxide (MILK OF MAGNESIA) suspension 30 mL  30 mL Oral Daily PRN Elmarie Shiley, NP      . traZODone (DESYREL) tablet 50 mg  50 mg Oral QHS PRN,MR X 1 Spencer E Simon, PA-C   50 mg at 04/19/14 2348    Observation Level/Precautions:  15 minute checks  Laboratory:  Per ED  Psychotherapy:  Group sessions  Medications: See medication lists   Consultations:  As needed  Discharge Concerns: Mood stabilization    Estimated LOS: 5-7 days  Other:     I certify that inpatient services furnished can reasonably be expected to improve the patient's condition.   Lindell Spar I, [place] 9/3/201511:19 AM  I have discussed case with NP and have met with patient. Agree with Assessment, Diagnosis , Plan. Patient is a 43 year old man- presents as fair historian.  Reports worsening depression, which he attributes to declining physical health and inability to do things he used to be able to. States he struggles with long standing epilepsy and " black outs " of uncertain origin that have made it difficult for him to drive, work, so that he now stays at home. Recently punched a wall or similar ( states it was in the context of blackout - does not remember episode) and has reported hallucinations, but not at this time. Reportedly reported suicidal ideations upon admission, which he is not endorsing at present. States he has Chronic Pain and uses opiates. Although information somewhat vague, states that he feels he has become dependent to opiates and may be misusing them. He las took opiates ( hydrocodone) 2 days ago. Initially denied any drinking but BAL 171 upon admission. Later reported he drinks a few times a week. Had also been taking Xanax up to 6 mgrs a day for anxiety ( not as antiseizure medication). At this time reports some opiate WDL symptoms- myalgias,  loose stools, vague malaise. We reviewed  above- patient acknoweldges it is possible that blackouts may be related to combination of ETOH, opiates and BZDs. Unsure if any of his seizures have been WDL seizures. He states that at this time he is " really wanting to get clean from all this stuff". Will initiate detox for opiates, bzds, ETOH. Continue Depakote ER, which is his established antiseizure medication.

## 2014-04-20 NOTE — [facility] Group Notes (Signed)
0900 nursing orientation group  The focus of this group is to educate the patient on the purpose and policies of crisis stabilization and provide a format to answer questions about their admission.  The group details unit policies and expectations of patients while admitted.  Pt did not attend he was in bed. 

## 2014-04-20 NOTE — [facility] Suicide Risk Assessment (Signed)
   Nursing information obtained from:  Patient Demographic factors:  Low socioeconomic status;Unemployed Current Mental Status:  Suicidal ideation indicated by others Loss Factors:  Decrease in vocational status Historical Factors:  NA Risk Reduction Factors:  Responsible for children under 43 years of age Total Time spent with patient: 45 minutes  CLINICAL FACTORS:   Depression:   Anhedonia Comorbid alcohol abuse/dependence  Psychiatric Specialty Exam: Physical Exam  ROS  Blood pressure 139/88, pulse 92, temperature 98.2 F (36.8 C), temperature source Oral, resp. rate 18, height  (1.854 m), weight 92.987 kg (205 lb).Body mass index is 27.05 kg/(m^2).  General Appearance: Fairly Groomed  Patent attorney::  Good  Speech:  Normal Rate  Volume:  Normal  Mood:  Anxious and Depressed  Affect:  Constricted  Thought Process:  Goal Directed and Linear  Orientation:  Other:  alert and attentive, oriented to person, place, and partially to time- was able to identify September/2015 but not date or day or week.  Thought Content:  describes auditory hallucinations at times , telling him " get him" , but at this time not intenally preoccupied, and denies halls today.  Suicidal Thoughts:  No- at this time denies any suicidal plan or intention and contracts for safety on the unit  Homicidal Thoughts:  No  Memory:  NA  Judgement:  Fair  Insight:  Fair  Psychomotor Activity:  mild distal tremors and restlessness  Concentration:  Fair  Recall:  Good  Fund of Knowledge:Good  Language: Good  Akathisia:  Negative  Handed:  Right  AIMS (if indicated):     Assets:  Desire for Improvement Resilience  Sleep:  Number of Hours: 5.25   Musculoskeletal: Strength & Muscle Tone: within normal limits- describes symptoms of opiate withdrawal- to include  Cramps, aches, diarrhea and " goosebumps". Also presents with some tremulousness. Gait & Station: normal Patient leans: N/A  COGNITIVE FEATURES  THAT CONTRIBUTE TO RISK:  Closed-mindedness    SUICIDE RISK:   Moderate:  Frequent suicidal ideation with limited intensity, and duration, some specificity in terms of plans, no associated intent, good self-control, limited dysphoria/symptomatology, some risk factors present, and identifiable protective factors, including available and accessible social support.  PLAN OF CARE: Patient will be admitted to inpatient psychiatric unit for stabilization and safety. Will provide and encourage milieu participation. Provide medication management and maked adjustments as needed.  Will also provide management to address potential withdrawal syndromes.  Will follow daily.    I certify that inpatient services furnished can reasonably be expected to improve the patient's condition.  Remi Lopata 04/20/2014, 12:01 PM

## 2014-04-21 DIAGNOSIS — F1994 Other psychoactive substance use, unspecified with psychoactive substance-induced mood disorder: Secondary | ICD-10-CM

## 2014-04-21 DIAGNOSIS — F1120 Opioid dependence, uncomplicated: Secondary | ICD-10-CM

## 2014-04-21 MED ORDER — TRAZODONE HCL 100 MG PO TABS
100.0000 mg | ORAL_TABLET | Freq: Every evening | ORAL | Status: DC | PRN
  Administered 2014-04-21 – 2014-04-24 (×7): 100 mg via ORAL
  Filled 2014-04-21: qty 1
  Filled 2014-04-21: qty 28
  Filled 2014-04-21 (×5): qty 1
  Filled 2014-04-21: qty 28
  Filled 2014-04-21: qty 1

## 2014-04-21 NOTE — Tx Team (Signed)
Interdisciplinary Treatment Plan Update (Adult) Date: 04/21/2014   Time Reviewed: 9:30 AM  Progress in Treatment: Attending groups: No Participating in groups: No Taking medication as prescribed: Yes Tolerating medication: Yes Family/Significant other contact made: CSW continuing to assess Patient understands diagnosis: Yes Discussing patient identified problems/goals with staff: Yes Medical problems stabilized or resolved: Yes Denies suicidal/homicidal ideation: CSW continuing to assess Issues/concerns per patient self-inventory: Yes Other:  New problem(s) identified: N/A  Discharge Plan or Barriers: CSW continuing to assess. Patient reports having memory lapses and is vague about events that precipitated current hospitalization. Patient reports that he would like to resume current outpatient services.  Reason for Continuation of Hospitalization:  Depression Anxiety Medication Stabilization   Comments: N/A  Estimated length of stay: 3-5 days  For review of initial/current patient goals, please see plan of care.  Attendees: Patient:    Family:    Physician:  Dr. Dub Mikes 04/18/2014 9:30 AM  Nursing: Burnetta Sabin,  RN 04/18/2014 9:30 AM  Clinical Social Worker: Belenda Cruise Andres Escandon,  LCSWA 04/18/2014 9:30 AM  Other: Juline Patch, LCSW 04/18/2014 9:30 AM  Other: Leisa Lenz, Vesta Mixer Liaison 04/18/2014 9:30 AM  Other: Onnie Boer, Case Manager 04/18/2014 9:30 AM  Other: Serena Colonel, NP 04/18/2014 9:30 AM  Other: Santa Genera, LCSWA 04/18/2014 9:30 AM  Other:    Other:    Other:    Other:        Scribe for Treatment Team:  Samuella Bruin, MSW, [employer] [phone]

## 2014-04-21 NOTE — Progress Notes (Signed)
[facility] MD Progress Note  04/21/2014 5:10 PM Russell [name]  MRN:  [id number] Subjective:  Russell [name] states he is still trying to get to terms and understand what has been going on with him. States that after he started having the seizures he lost his license, lost his job. He now understands that the medications he has been taking have a lot to do with what was going on with him. He wants to get down to the very minimum. (Now understands that he has to be on a Depakote therapeutic level) He is still not feeling right. States he is still not sleeping well. 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   HENT: Negative.   Eyes: Negative.   Respiratory: Negative.   Cardiovascular: Negative.   Gastrointestinal: Negative.   Genitourinary: Negative.   Musculoskeletal: Negative.   Skin: Negative.   Neurological: Negative.   Endo/Heme/Allergies: Negative.   Psychiatric/Behavioral: Positive for depression and substance abuse. The patient is nervous/anxious.     Blood pressure 129/77, pulse 64, temperature 97.6 F (36.4 C), temperature source Oral, resp. rate 16, height  (1.854 m), weight 92.987 kg (205 lb), SpO2 98.00%.Body mass index is 27.05 kg/(m^2).  General Appearance: Fairly Groomed  Patent attorney::  Fair  Speech:  Clear and Coherent  Volume:  fluctuates  Mood:  Anxious, Depressed and worried  Affect:  anxious, worried  Thought Process:  Coherent and Goal Directed  Orientation:  Full (Time, Place, and Person)  Thought Content:  symptoms worries concerns  Suicidal Thoughts:  No  Homicidal  Thoughts:  No  Memory:  Immediate;   Fair Recent;   Fair Remote;   Fair  Judgement:  Fair  Insight:  Present and Shallow  Psychomotor Activity:  Restlessness  Concentration:  Fair  Recall:  [employer] of Knowledge:NA  Language: Fair  Akathisia:  No  Handed:    AIMS (if indicated):     Assets:  Desire for Improvement  Sleep:  Number of Hours: 6.5   Musculoskeletal: Strength & Muscle Tone: within normal limits Gait & Station: normal Patient leans: N/A  Current Medications: Current Facility-Administered Medications  Medication Dose Route Frequency Provider Last Rate Last Dose  . acetaminophen (TYLENOL) tablet 650 mg  650 mg Oral Q6H PRN Fransisca Kaufmann, NP      . alum & mag hydroxide-simeth (MAALOX/MYLANTA) 200-200-20 MG/5ML suspension 30 mL  30 mL Oral Q4H PRN Fransisca Kaufmann, NP   30 mL at 04/19/14 2212  . chlordiazePOXIDE (LIBRIUM) capsule 25 mg  25 mg Oral Q6H PRN Sanjuana Kava, NP      . chlordiazePOXIDE (LIBRIUM) capsule 25 mg  25 mg Oral TID Sanjuana Kava, NP   25 mg at 04/21/14 1154   Followed by  . [START ON 04/22/2014] chlordiazePOXIDE (LIBRIUM) capsule 25 mg  25 mg Oral BH-qamhs Sanjuana Kava, NP       Followed by  . [START ON 04/24/2014] chlordiazePOXIDE (LIBRIUM) capsule 25 mg  25 mg Oral Daily Sanjuana Kava, NP      . cloNIDine (CATAPRES) tablet 0.1 mg  0.1 mg Oral QID Sanjuana Kava, NP  0.1 mg at 04/21/14 1240   Followed by  . [START ON 04/22/2014] cloNIDine (CATAPRES) tablet 0.1 mg  0.1 mg Oral BH-qamhs Sanjuana Kava, NP       Followed by  . [START ON 04/25/2014] cloNIDine (CATAPRES) tablet 0.1 mg  0.1 mg Oral QAC breakfast Sanjuana Kava, NP      . dicyclomine (BENTYL) tablet 20 mg  20 mg Oral Q6H PRN Sanjuana Kava, NP   20 mg at 04/20/14 1411  . divalproex (DEPAKOTE) DR tablet 500 mg  500 mg Oral BID WC Sanjuana Kava, NP   500 mg at 04/21/14 0849  . hydrOXYzine (ATARAX/VISTARIL) tablet 25 mg  25 mg Oral Q6H PRN Sanjuana Kava, NP      . loperamide (IMODIUM) capsule 2-4 mg  2-4  mg Oral PRN Sanjuana Kava, NP   4 mg at 04/20/14 1410  . magnesium hydroxide (MILK OF MAGNESIA) suspension 30 mL  30 mL Oral Daily PRN Fransisca Kaufmann, NP      . methocarbamol (ROBAXIN) tablet 500 mg  500 mg Oral Q8H PRN Sanjuana Kava, NP   500 mg at 04/20/14 2212  . multivitamin with minerals tablet 1 tablet  1 tablet Oral Daily Nehemiah Massed, MD   1 tablet at 04/21/14 0849  . naproxen (NAPROSYN) tablet 500 mg  500 mg Oral BID PRN Sanjuana Kava, NP   500 mg at 04/20/14 2212  . ondansetron (ZOFRAN-ODT) disintegrating tablet 4 mg  4 mg Oral Q6H PRN Sanjuana Kava, NP      . thiamine (VITAMIN B-1) tablet 100 mg  100 mg Oral Daily Nehemiah Massed, MD   100 mg at 04/21/14 1610  . traZODone (DESYREL) tablet 100 mg  100 mg Oral QHS PRN,MR X 1 Rachael Fee, MD        Lab Results: No results found for this or any previous visit (from the past 48 hour(s)).  Physical Findings: AIMS: Facial and Oral Movements Muscles of Facial Expression: None, normal Lips and Perioral Area: None, normal Jaw: None, normal Tongue: None, normal,Extremity Movements Upper (arms, wrists, hands, fingers): None, normal Lower (legs, knees, ankles, toes): None, normal, Trunk Movements Neck, shoulders, hips: None, normal, Overall Severity Severity of abnormal movements (highest score from questions above): None, normal Incapacitation due to abnormal movements: None, normal Patient's awareness of abnormal movements (rate only patient's report): No Awareness, Dental Status Current problems with teeth and/or dentures?: No Does patient usually wear dentures?: No  CIWA:  CIWA-Ar Total: 7 COWS:  COWS Total Score: 8  Treatment Plan Summary: Daily contact with patient to assess and evaluate symptoms and progress in treatment Medication management  Plan: Supportive approach/coping skills           Will increase the Trazodone to 100 mg HS PRN sleep              Medical Decision Making Problem Points:  Review of psycho-social  stressors (1) Data Points:  Review of medication regiment & side effects (2) Review of new medications or change in dosage (2)  I certify that inpatient services furnished can reasonably be expected to improve the patient's condition.   Faolan Springfield A 04/21/2014, 5:10 PM

## 2014-04-21 NOTE — Progress Notes (Signed)
D) Pt is attending the groups and interacting with his peers appropriately. Affect is flat and mood depressed. Pt rates his depression at a 2, his hopelessness at a 2 and his anxiety at a 3. States his goal today is working on Positive Thinking. Pt denies SI and HI. A) Given support, reassurance and praise. Encouraged to continue to go to the groups and to work on his packet. Provided with a brief 1:1. R) Pt denies SI and HI. No seizure activity thus far this shift.

## 2014-04-22 DIAGNOSIS — F1910 Other psychoactive substance abuse, uncomplicated: Secondary | ICD-10-CM

## 2014-04-22 NOTE — Progress Notes (Signed)
Date: 04/22/2014  Time: 1015  Group Topic/Focus:  Identifying Needs: The focus of this group is to help patients identify their personal needs that have been historically problematic and identify healthy behaviors to address their needs.  Participation Level: Active  Participation Quality: Appropriate  Affect: Appropriate  Cognitive: Oriented  Insight: Improving  Engagement in Group: Engaged  Additional Comments: Participates in the conversation and is supportive of the peers in the group  Kierre Hintz A  

## 2014-04-22 NOTE — Progress Notes (Signed)
Pt c/o not sleeping well and increased anxiety. Pt advised to discuss medication with physician. Will continue to monitor and evaluate for stabilization.

## 2014-04-22 NOTE — Progress Notes (Signed)
The patient attended the  A.A. Meeting and was appropriate.   

## 2014-04-22 NOTE — Progress Notes (Signed)
Patient ID: Russell [name], male   DOB: 05-19-1971, 43 y.o.   MRN: [id number] Pt alert and oriented in dayroom. Interactive with peers and staff. Denies SI/HI . Verbally contracts for safety. -A/[facility]   C/o generalized aching; "I also developed cough and running nose since here". Pt monitored q , encouraged to attend groups and medications given per physicians orders.

## 2014-04-22 NOTE — [facility] Group Notes (Signed)
[facility] Group Notes: (Clinical Social Work)   04/22/2014      Type of Therapy:  Group Therapy   Participation Level:  Did Not Attend    Alexxis Mackert Grossman-Orr, LCSW 04/22/2014, 4:33 PM     

## 2014-04-22 NOTE — [facility] Group Notes (Signed)
[facility] Group Notes:  (Nursing/MHT/Case Management/Adjunct)  Date:  04/22/2014  Time:  12:08 PM  Type of Therapy:  Psychoeducational Skills- Patient Self Inventory  Participation Level:  Did Not Attend  Russell [name] 04/22/2014, 12:08 PM

## 2014-04-22 NOTE — Progress Notes (Signed)
D) Pt has attended the groups and interacts with peers appropriately. Denies SI and HI. Rates his depression, hopelessness and anxiety all at a 1. Feels as thought he is doing well with his detox.  A) Given support, reassurance and praise. Encouragement given. R) Denies SI and HI.

## 2014-04-22 NOTE — Progress Notes (Signed)
Patient ID: Russell [name], male   DOB: 1970-11-29, 43 y.o.   MRN: [id number] [facility] MD Progress Note  04/22/2014 4:12 PM Russell [name]  MRN:  [id number] Subjective:  Patient notes that he is feeling much better, and has informed his family that he is not ready to leave [facility]. HE feels that he is invested in recovery and that he is ready to be compliant with the program. States he has some diarrhea but is otherwise well. He took some Immodium which did help but it returned when he stopped the Immodium. Objective: Patient was seen in his room in bed during group time, which appears to be incongruent with his statement of being motivated. His affect is positive but feels less sincere than I'm sure he meant it to be. Speech is clear and goal directed. CIWA is a 2, COWS 6. 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   HENT: Negative.   Eyes: Negative.   Respiratory: Negative.   Cardiovascular: Negative.   Gastrointestinal: Negative.   Genitourinary: Negative.   Musculoskeletal: Negative.   Skin: Negative.   Neurological: Negative.   Endo/Heme/Allergies: Negative.   Psychiatric/Behavioral: Positive for depression and substance abuse. The patient is nervous/anxious.     Blood pressure 130/81, pulse 87, temperature 98.6 F (37 C), temperature source Oral, resp. rate 18, height  (1.854 m), weight 92.987 kg (205 lb), SpO2 98.00%.Body mass index is 27.05 kg/(m^2).  General Appearance: Fairly Groomed  Patent attorney::  Fair  Speech:  Clear and Coherent  Volume:  fluctuates  Mood:  Anxious, Depressed and worried  Affect:  anxious, worried  Thought Process:   Coherent and Goal Directed  Orientation:  Full (Time, Place, and Person)  Thought Content:  symptoms worries concerns  Suicidal Thoughts:  No  Homicidal Thoughts:  No  Memory:  Immediate;   Fair Recent;   Fair Remote;   Fair  Judgement:  Fair  Insight:  Present and Shallow  Psychomotor Activity:  Restlessness  Concentration:  Fair  Recall:  [employer] of Knowledge:NA  Language: Fair  Akathisia:  No  Handed:    AIMS (if indicated):     Assets:  Desire for Improvement  Sleep:  Number of Hours: 5.5   Musculoskeletal: Strength & Muscle Tone: within normal limits Gait & Station: normal Patient leans: N/A  Current Medications: Current Facility-Administered Medications  Medication Dose Route Frequency Provider Last Rate Last Dose  . acetaminophen (TYLENOL) tablet 650 mg  650 mg Oral Q6H PRN Fransisca Kaufmann, NP      . alum & mag hydroxide-simeth (MAALOX/MYLANTA) 200-200-20 MG/5ML suspension 30 mL  30 mL Oral Q4H PRN Fransisca Kaufmann, NP   30 mL at 04/21/14 2152  . chlordiazePOXIDE (LIBRIUM) capsule 25 mg  25 mg Oral Q6H PRN Sanjuana Kava, NP      . chlordiazePOXIDE (LIBRIUM) capsule 25 mg  25 mg Oral BH-qamhs Sanjuana Kava, NP       Followed by  . [START ON 04/24/2014] chlordiazePOXIDE (LIBRIUM) capsule 25 mg  25 mg Oral Daily Sanjuana Kava, NP      . cloNIDine (CATAPRES) tablet 0.1 mg  0.1 mg Oral BH-qamhs Sanjuana Kava, NP  Followed by  . [START ON 04/25/2014] cloNIDine (CATAPRES) tablet 0.1 mg  0.1 mg Oral QAC breakfast Sanjuana Kava, NP      . dicyclomine (BENTYL) tablet 20 mg  20 mg Oral Q6H PRN Sanjuana Kava, NP   20 mg at 04/20/14 1411  . divalproex (DEPAKOTE) DR tablet 500 mg  500 mg Oral BID WC Sanjuana Kava, NP   500 mg at 04/22/14 0843  . hydrOXYzine (ATARAX/VISTARIL) tablet 25 mg  25 mg Oral Q6H PRN Sanjuana Kava, NP      . loperamide (IMODIUM) capsule 2-4 mg  2-4 mg Oral PRN Sanjuana Kava, NP   4 mg at 04/20/14 1410  . magnesium hydroxide (MILK OF MAGNESIA) suspension 30 mL  30  mL Oral Daily PRN Fransisca Kaufmann, NP      . methocarbamol (ROBAXIN) tablet 500 mg  500 mg Oral Q8H PRN Sanjuana Kava, NP   500 mg at 04/20/14 2212  . multivitamin with minerals tablet 1 tablet  1 tablet Oral Daily Nehemiah Massed, MD   1 tablet at 04/22/14 0843  . naproxen (NAPROSYN) tablet 500 mg  500 mg Oral BID PRN Sanjuana Kava, NP   500 mg at 04/21/14 2134  . ondansetron (ZOFRAN-ODT) disintegrating tablet 4 mg  4 mg Oral Q6H PRN Sanjuana Kava, NP      . thiamine (VITAMIN B-1) tablet 100 mg  100 mg Oral Daily Nehemiah Massed, MD   100 mg at 04/22/14 0843  . traZODone (DESYREL) tablet 100 mg  100 mg Oral QHS PRN,MR X 1 Rachael Fee, MD   100 mg at 04/21/14 2135    Lab Results: No results found for this or any previous visit (from the past 48 hour(s)).  Physical Findings: AIMS: Facial and Oral Movements Muscles of Facial Expression: None, normal Lips and Perioral Area: None, normal Jaw: None, normal Tongue: None, normal,Extremity Movements Upper (arms, wrists, hands, fingers): None, normal Lower (legs, knees, ankles, toes): None, normal, Trunk Movements Neck, shoulders, hips: None, normal, Overall Severity Severity of abnormal movements (highest score from questions above): None, normal Incapacitation due to abnormal movements: None, normal Patient's awareness of abnormal movements (rate only patient's report): No Awareness, Dental Status Current problems with teeth and/or dentures?: No Does patient usually wear dentures?: No  CIWA:  CIWA-Ar Total: 2 COWS:  COWS Total Score: 6  Treatment Plan Summary: Daily contact with patient to assess and evaluate symptoms and progress in treatment Medication management  Plan: Supportive approach/coping skills           Continue to follow current plan with no changes at this time.            Patient encouraged to request Immodium for his upset stomach.             Medical Decision Making Problem Points:  Review of psycho-social stressors  (1) Data Points:  Review of medication regiment & side effects (2) Review of new medications or change in dosage (2)  I certify that inpatient services furnished can reasonably be expected to improve the patient's condition.  Rona Ravens. Mashburn RPAC 4:23 PM 04/22/2014  Patient seen, evaluated and I agree with notes by Nurse Practitioner. Thedore Mins, MD

## 2014-04-23 MED ORDER — PANTOPRAZOLE SODIUM 40 MG PO TBEC
40.0000 mg | DELAYED_RELEASE_TABLET | Freq: Two times a day (BID) | ORAL | Status: DC
  Administered 2014-04-23 – 2014-04-25 (×4): 40 mg via ORAL
  Filled 2014-04-23 (×10): qty 1

## 2014-04-23 MED ORDER — GUAIFENESIN 100 MG/5ML PO SOLN
5.0000 mL | ORAL | Status: DC | PRN
  Administered 2014-04-23: 100 mg via ORAL

## 2014-04-23 MED ORDER — MENTHOL 3 MG MT LOZG: 1.0000 | LOZENGE | OROMUCOSAL | Status: DC | PRN

## 2014-04-23 NOTE — [facility] Group Notes (Signed)
[facility] Group Notes:  (Nursing/MHT/Case Management/Adjunct)  Date:  04/23/2014  Time:  2:00 PM  Type of Therapy:  Psychoeducational Skills- Patient Self Inventory Group  Participation Level:  Did Not Attend  Buford Dresser 04/23/2014, 2:00 PM

## 2014-04-23 NOTE — Progress Notes (Signed)
Psychoeducational Group Note  Date:  04/22/2014 Time:  2000  Group Topic/Focus:  Wrap-Up Group:   The focus of this group is to help patients review their daily goal of treatment and discuss progress on daily workbooks.  Participation Level: Did Not Attend  Participation Quality:  Not Applicable  Affect:  Not Applicable  Cognitive:  Not Applicable  Insight:  Not Applicable  Engagement in Group: Not Applicable  Additional Comments:  The patient didn't attend group since he was complaining of an upset stomach and diarrhea earlier in the day.   Javia Dillow S 04/23/2014, 3:27 AM

## 2014-04-23 NOTE — Progress Notes (Signed)
Writer observed patient up briefly in the dayroom before group started and he returned to his room and was resting during group.Patient did not attend group this evening. He reports that he is hopeful to discharge on Tuesday. He reported that he did not rest well last night and writer encouraged him to take his trazadone at hs and request the repeat in an hour and see if this will be helpful with rest. Patient denies si/hi/,/a/v hallucinations. Support and encouragement given, safety maintained on unit with 15 min checks.

## 2014-04-23 NOTE — Progress Notes (Signed)
Writer spoke with patient 1:1 and he reports having had a good day. He reports that he has found the groups that he has attended to be very helpful and reports that the staff have been very nice and kind. He reports that his thought process has changed for the better since being here and he looks at things in a positive way. He denies si/hi/a/v hallucinations. Support and encouragement given, safety maintained on unit with 15 min checks.

## 2014-04-23 NOTE — Plan of Care (Signed)
Problem: Ineffective individual coping Goal: STG: Patient will remain free from self harm Outcome: Progressing Patient has remained free from self harm.     

## 2014-04-23 NOTE — [facility] Group Notes (Signed)
[facility] Group Notes: (Clinical Social Work)   04/23/2014      Type of Therapy:  Group Therapy   Participation Level:  Did Not Attend    Lashaunta Sicard Grossman-Orr, LCSW 04/23/2014, 2:55 PM     

## 2014-04-23 NOTE — Progress Notes (Signed)
Patient ID: Russell [name], male   DOB: 19-Nov-1970, 43 y.o.   MRN: [id number] Patient ID: Russell [name], male   DOB: Jan 04, 1971, 43 y.o.   MRN: [id number] [facility] MD Progress Note  04/23/2014 4:09 PM Russell [name]  MRN:  [id number] Subjective: Patient reports indigestion today and that he takes Prilosec  a day at home. He reports also having a sore throat, and feeling a bit like the flu, but has been a febrile.  He notes that the diarrhea has stopped, but he has significant head congestion which is very uncomfortable for him.    Objective: Patient was seen in his room in during group time again? Today he looks fit and healthy but is requesting meds as above. Goes on to say that he was taking  of Xanax a day prior to coming in from one MD to another. 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   HENT: Negative.   Eyes: Negative.   Respiratory: Negative.   Cardiovascular: Negative.   Gastrointestinal: Negative.   Genitourinary: Negative.   Musculoskeletal: Negative.   Skin: Negative.   Neurological: Negative.   Endo/Heme/Allergies: Negative.   Psychiatric/Behavioral: Positive for depression and substance abuse. The patient is nervous/anxious.     Blood pressure 141/77, pulse 82, temperature 98.3 F (36.8 C), temperature source Oral, resp. rate 18, height  (1.854 m), weight 92.987 kg (205 lb), SpO2 98.00%.Body mass index is 27.05 kg/(m^2).  General Appearance: Fairly Groomed  Patent attorney::  Fair  Speech:  Clear and Coherent  Volume:  fluctuates  Mood:  Euthymic, convivial,   Affect:  Bright and smiling  Thought Process:  Coherent and Goal Directed   Orientation:  Full (Time, Place, and Person)  Thought Content:  symptoms worries concerns  Suicidal Thoughts:  No  Homicidal Thoughts:  No  Memory:  Immediate;   Fair Recent;   Fair Remote;   Fair  Judgement:  Fair  Insight:  Present and Shallow  Psychomotor Activity:  Restlessness  Concentration:  Fair  Recall:  [employer] of Knowledge:NA  Language: Fair  Akathisia:  No  Handed:    AIMS (if indicated):     Assets:  Desire for Improvement  Sleep:  Number of Hours: 5.75   Musculoskeletal: Strength & Muscle Tone: within normal limits Gait & Station: normal Patient leans: N/A  Current Medications: Current Facility-Administered Medications  Medication Dose Route Frequency Provider Last Rate Last Dose  . acetaminophen (TYLENOL) tablet 650 mg  650 mg Oral Q6H PRN Fransisca Kaufmann, NP      . alum & mag hydroxide-simeth (MAALOX/MYLANTA) 200-200-20 MG/5ML suspension 30 mL  30 mL Oral Q4H PRN Fransisca Kaufmann, NP   30 mL at 04/21/14 2152  . chlordiazePOXIDE (LIBRIUM) capsule 25 mg  25 mg Oral Q6H PRN Sanjuana Kava, NP      . chlordiazePOXIDE (LIBRIUM) capsule 25 mg  25 mg Oral BH-qamhs Sanjuana Kava, NP       Followed by  . [START ON 04/24/2014] chlordiazePOXIDE (LIBRIUM) capsule 25 mg  25 mg Oral Daily Sanjuana Kava, NP      . cloNIDine (CATAPRES) tablet 0.1 mg  0.1 mg Oral BH-qamhs Sanjuana Kava, NP  Followed by  . [START ON 04/25/2014] cloNIDine (CATAPRES) tablet 0.1 mg  0.1 mg Oral QAC breakfast Sanjuana Kava, NP      . dicyclomine (BENTYL) tablet 20 mg  20 mg Oral Q6H PRN Sanjuana Kava, NP   20 mg at 04/20/14 1411  . divalproex (DEPAKOTE) DR tablet 500 mg  500 mg Oral BID WC Sanjuana Kava, NP   500 mg at 04/22/14 0843  . hydrOXYzine (ATARAX/VISTARIL) tablet 25 mg  25 mg Oral Q6H PRN Sanjuana Kava, NP      . loperamide (IMODIUM) capsule 2-4 mg  2-4 mg Oral PRN Sanjuana Kava, NP   4 mg at 04/20/14 1410  . magnesium hydroxide (MILK OF MAGNESIA) suspension 30 mL  30 mL Oral Daily PRN Fransisca Kaufmann, NP      . methocarbamol (ROBAXIN) tablet 500 mg  500 mg Oral Q8H PRN Sanjuana Kava, NP   500 mg at 04/20/14 2212  . multivitamin with minerals tablet 1 tablet  1 tablet Oral Daily Nehemiah Massed, MD   1 tablet at 04/22/14 0843  . naproxen (NAPROSYN) tablet 500 mg  500 mg Oral BID PRN Sanjuana Kava, NP   500 mg at 04/21/14 2134  . ondansetron (ZOFRAN-ODT) disintegrating tablet 4 mg  4 mg Oral Q6H PRN Sanjuana Kava, NP      . thiamine (VITAMIN B-1) tablet 100 mg  100 mg Oral Daily Nehemiah Massed, MD   100 mg at 04/22/14 0843  . traZODone (DESYREL) tablet 100 mg  100 mg Oral QHS PRN,MR X 1 Rachael Fee, MD   100 mg at 04/21/14 2135    Lab Results: No results found for this or any previous visit (from the past 48 hour(s)).  Physical Findings: AIMS: Facial and Oral Movements Muscles of Facial Expression: None, normal Lips and Perioral Area: None, normal Jaw: None, normal Tongue: None, normal,Extremity Movements Upper (arms, wrists, hands, fingers): None, normal Lower (legs, knees, ankles, toes): None, normal, Trunk Movements Neck, shoulders, hips: None, normal, Overall Severity Severity of abnormal movements (highest score from questions above): None, normal Incapacitation due to abnormal movements: None, normal Patient's awareness of abnormal movements (rate only patient's report): No Awareness, Dental Status Current problems with teeth and/or dentures?: No Does patient usually wear dentures?: No  CIWA:  CIWA-Ar Total: 0 COWS:  COWS Total Score: 6  Treatment Plan Summary: Daily contact with patient to assess and evaluate symptoms and progress in treatment Medication management  Plan: 1. Will order Cepacol for sore throat.            2. Duratuss for thinning of mucous secretions.            3. Prilosec  po BID for GERD.            4. Will continue current plan of care with no changes at this time. Medical Decision Making Problem Points:  Review of psycho-social stressors  (1) Data Points:  Review of medication regiment & side effects (2) Review of new medications or change in dosage (2)  I certify that inpatient services furnished can reasonably be expected to improve the patient's condition.  Rona Ravens. Mashburn RPAC 4:09 PM 04/23/2014  Patient seen, evaluated and I agree with notes by Nurse Practitioner. Thedore Mins, MD

## 2014-04-23 NOTE — Progress Notes (Signed)
Psychoeducational Group Note  Date:  04/23/2014 Time:  1500 Group Topic/Focus:  Making Healthy Choices:   The focus of this group is to help patients identify negative/unhealthy choices they were using prior to admission and identify positive/healthier coping strategies to replace them upon discharge. Also teach relaxation breathing and progressive relaxation skills  Participation Level:  Active  Participation Quality:  Appropriate  Affect:  Appropriate  Cognitive:  Oriented  Insight:  Improving  Engagement in Group:  Engaged  Additional Comments:   Rhegan Trunnell A 04/23/2014  

## 2014-04-23 NOTE — Plan of Care (Signed)
Problem: Alteration in thought process Goal: STG-Patient is able to discuss thoughts with staff Outcome: Progressing Patient reports that the groups have been helpful and his thought process and how he looks at different situations are more positive.

## 2014-04-23 NOTE — Progress Notes (Signed)
D) Pt rates his depression, hopelessness and anxiety all at a 2. Denies SI and HI. Pt states that he is feeling physically poor today. Has a stuffy head and nose. Also states "some chest congestion". Has allergies but "not this bad". Has spent a good part of the day in bed resting. In his inventory Pt wrote that he wants to remain positive, with positive thinking.  A) Pt given support, reassurance and praise. Encouragement given to speak with the doctor about not feeling well and to ask for a medication for his stuffiness and congestion. Provided with a 1:1 R) Pt denies SI and HI. Affect is brighter with interaction and is appropriate with his peers

## 2014-04-23 NOTE — Plan of Care (Signed)
Problem: Ineffective individual coping Goal: STG: Patient will remain free from self harm Outcome: Progressing Patient has remained free from self harm and denies suicidal thoughts.

## 2014-04-23 NOTE — [facility] Group Notes (Signed)
[facility] Group Notes:  Life skills  Date:  04/23/2014  Time:  12:19 PM  Type of Therapy:  Nurse Education  Participation Level:  Did Not Attend  Participation Quality:  Inattentive  Affect:  Blunted  Cognitive:  Lacking  Insight:  None  Engagement in Group:  None  Modes of Intervention:  Discussion  Summary of Progress/Problems:  Nicole Cella 04/23/2014, 12:19 PM

## 2014-04-24 NOTE — Progress Notes (Signed)
CSW attempted to make follow up appointment at [facility] but office is closed for the holiday.   Samuella Bruin, MSW, [employer] Clinical Social Worker [facility] [phone]

## 2014-04-24 NOTE — Progress Notes (Signed)
Patient ID: Russell [name], male   DOB: Jun 02, 1971, 43 y.o.   MRN: [id number] [facility] MD Progress Note  04/24/2014 3:44 PM Jimmy Plessinger  MRN:  [id number] Subjective: Reports he is feeling better, and is hoping for discharge soon.  Objective: Patient is much improved- he is feeling better and states he is " like a new man" which he attributes to being off the opiates and BZDs.  He has had no seizures here in hospital, and states there have been no recent blackouts. He has been going to groups and has been participative in milieu. Denies medication side effects- he is completing librium detox and clonidine detox. He has been on Depakote DR for years for management of seizure disorder. At this time he is not endorsing any significant physical pain and has not noticed increase in pain off opiates thus far. He is future oriented, and is hoping for discharge tomorrow, stating that his family has a vacation scheduled which he would not want to miss.   DSM5: Substance/Addictive Disorders:  Benzodiazepine related disorder Depressive Disorders:  Major Depressive Disorder - Moderate (296.22) Total Time spent with patient: 25 minutes  Axis I: Substance Induced Mood Disorder  ADL's:  Intact  Sleep: improving  Appetite:  Improving Psychiatric Specialty Exam: Physical Exam  Review of Systems  Constitutional: Negative.   HENT: Negative.   Eyes: Negative.   Respiratory: Negative.   Cardiovascular: Negative.   Gastrointestinal: Negative.   Genitourinary: Negative.   Musculoskeletal: Negative.   Skin: Negative.   Neurological: Negative.   Endo/Heme/Allergies: Negative.   Psychiatric/Behavioral: Positive for depression and substance abuse. The patient is nervous/anxious.     Blood pressure 130/76, pulse 58, temperature 98.5 F (36.9 C), temperature source Oral, resp. rate 18, height  (1.854 m), weight 92.987 kg (205 lb), SpO2 98.00%.Body mass index is 27.05 kg/(m^2).  General  Appearance: Well Groomed  Patent attorney::  Good  Speech:  Clear and Coherent  Volume:  Normal  Mood:  Euthymic, convivial,   Affect:  Reactive, appropriate  Thought Process:  Coherent and Goal Directed  Orientation:  Full (Time, Place, and Person)  Thought Content:  denies hallucinations, no delusions  Suicidal Thoughts:  No- denies any suicidal or homicidal ideations  Homicidal Thoughts:  No  Memory:  NA  Judgement:  Fair  Insight:  improving  Psychomotor Activity:  Normal  Concentration:  Fair  Recall:  [employer] of Knowledge:NA  Language: Fair  Akathisia:  No  Handed:    AIMS (if indicated):     Assets:  Desire for Improvement  Sleep:  Number of Hours: 5.75   Musculoskeletal: Strength & Muscle Tone: within normal limits Gait & Station: normal Patient leans: N/A  Current Medications: Current Facility-Administered Medications  Medication Dose Route Frequency Provider Last Rate Last Dose  . acetaminophen (TYLENOL) tablet 650 mg  650 mg Oral Q6H PRN Fransisca Kaufmann, NP      . alum & mag hydroxide-simeth (MAALOX/MYLANTA) 200-200-20 MG/5ML suspension 30 mL  30 mL Oral Q4H PRN Fransisca Kaufmann, NP   30 mL at 04/21/14 2152  . chlordiazePOXIDE (LIBRIUM) capsule 25 mg  25 mg Oral Q6H PRN Sanjuana Kava, NP      . chlordiazePOXIDE (LIBRIUM) capsule 25 mg  25 mg Oral BH-qamhs Sanjuana Kava, NP       Followed by  . [START ON 04/24/2014] chlordiazePOXIDE (LIBRIUM) capsule 25 mg  25 mg Oral Daily Sanjuana Kava, NP      . cloNIDine (  CATAPRES) tablet 0.1 mg  0.1 mg Oral BH-qamhs Sanjuana Kava, NP       Followed by  . [START ON 04/25/2014] cloNIDine (CATAPRES) tablet 0.1 mg  0.1 mg Oral QAC breakfast Sanjuana Kava, NP      . dicyclomine (BENTYL) tablet 20 mg  20 mg Oral Q6H PRN Sanjuana Kava, NP   20 mg at 04/20/14 1411  . divalproex (DEPAKOTE) DR tablet 500 mg  500 mg Oral BID WC Sanjuana Kava, NP   500 mg at 04/22/14 0843  . hydrOXYzine (ATARAX/VISTARIL) tablet 25 mg  25 mg Oral Q6H PRN Sanjuana Kava, NP      . loperamide (IMODIUM) capsule 2-4 mg  2-4 mg Oral PRN Sanjuana Kava, NP   4 mg at 04/20/14 1410  . magnesium hydroxide (MILK OF MAGNESIA) suspension 30 mL  30 mL Oral Daily PRN Fransisca Kaufmann, NP      . methocarbamol (ROBAXIN) tablet 500 mg  500 mg Oral Q8H PRN Sanjuana Kava, NP   500 mg at 04/20/14 2212  . multivitamin with minerals tablet 1 tablet  1 tablet Oral Daily Nehemiah Massed, MD   1 tablet at 04/22/14 0843  . naproxen (NAPROSYN) tablet 500 mg  500 mg Oral BID PRN Sanjuana Kava, NP   500 mg at 04/21/14 2134  . ondansetron (ZOFRAN-ODT) disintegrating tablet 4 mg  4 mg Oral Q6H PRN Sanjuana Kava, NP      . thiamine (VITAMIN B-1) tablet 100 mg  100 mg Oral Daily Nehemiah Massed, MD   100 mg at 04/22/14 0843  . traZODone (DESYREL) tablet 100 mg  100 mg Oral QHS PRN,MR X 1 Rachael Fee, MD   100 mg at 04/21/14 2135    Lab Results: No results found for this or any previous visit (from the past 48 hour(s)).  Physical Findings: AIMS: Facial and Oral Movements Muscles of Facial Expression: None, normal Lips and Perioral Area: None, normal Jaw: None, normal Tongue: None, normal,Extremity Movements Upper (arms, wrists, hands, fingers): None, normal Lower (legs, knees, ankles, toes): None, normal, Trunk Movements Neck, shoulders, hips: None, normal, Overall Severity Severity of abnormal movements (highest score from questions above): None, normal Incapacitation due to abnormal movements: None, normal Patient's awareness of abnormal movements (rate only patient's report): No Awareness, Dental Status Current problems with teeth and/or dentures?: No Does patient usually wear dentures?: No  CIWA:  CIWA-Ar Total: 1 COWS:  COWS Total Score: 2  Assessment- at this time patient is improved compared to admission. Mood is improved, affect is brighter. There have been no seizures or blackouts since admission. He is not currently presenting with any significant WDL symptoms as he  completes his detox protocols. He is expressing increased insight regarding negative impact of opiate/BZDs and states " I am planning on staying all of those meds completely" Hoping for discharge tomorrow to go on family vacation.  Treatment Plan Summary: Daily contact with patient to assess and evaluate symptoms and progress in treatment Medication management  Plan: Continue inpatient treatment Continue Detox protocols  Consider discharge soon as he continues to improve. Medical Decision Making Problem Points:  Established problem, stable/improving (1), Review of last therapy session (1) and Review of psycho-social stressors (1) Data Points:  Review of medication regiment & side effects (2) Review of new medications or change in dosage (2)  I certify that inpatient services furnished can reasonably be expected to improve the patient's condition.

## 2014-04-24 NOTE — Progress Notes (Signed)
D:  Patient's self inventory sheet, patient has poor sleep, sleep medication did not help him last night.  Fair appetite, low energy level, good concentration.  Denied depression and hopeless.  Rated anxiety 5.  Stated he has experienced all withdrawals symptoms.  Denied SI.  In past 24 hours has felt lightheaded and blurred vision.  Back pain because of mattress.   Goal today is to work on positive thinking.  Will work on plan to get back in town.  Plans to discharge on Tuesday at 3:00 p.m.  Does have discharge plan.  No problems taking medications as scheduled. A:  Medications administered per MD orders.  Emotional support and encouragement given patient. R:  Denied SI and HI, contracts for safety.   Denied A/V hallucinations.  Safety maintained with 15 minute checks.  Patient wants to find a job and take care of his 2 children.  Stated his wife has been very supportive.

## 2014-04-24 NOTE — [facility] Suicide Risk Assessment (Signed)
[facility] INPATIENT:  Family/Significant Other Suicide Prevention Education  Suicide Prevention Education:  Education Completed; Wife Russell [name] [phone],  (name of family member/significant other) has been identified by the patient as the family member/significant other with whom the patient will be residing, and identified as the person(s) who will aid the patient in the event of a mental health crisis (suicidal ideations/suicide attempt).  With written consent from the patient, the family member/significant other has been provided the following suicide prevention education, prior to the and/or following the discharge of the patient.  The suicide prevention education provided includes the following:  Suicide risk factors  Suicide prevention and interventions  National Suicide Hotline telephone number  [facility] assessment telephone number  [facility] Emergency Assistance 911  [facility] and/or Residential Mobile Crisis Unit telephone number  Request made of family/significant other to:  Remove weapons (e.g., guns, rifles, knives), all items previously/currently identified as safety concern.    Remove drugs/medications (over-the-counter, prescriptions, illicit drugs), all items previously/currently identified as a safety concern.  The family member/significant other verbalizes understanding of the suicide prevention education information provided.  The family member/significant other agrees to remove the items of safety concern listed above.    Kianna Billet, [place] 04/24/2014, 1:13 PM

## 2014-04-24 NOTE — [facility] Group Notes (Signed)
[facility] LCSW Aftercare Discharge Planning Group Note  04/24/2014  8:45 AM  Participation Quality: Did Not Attend.  Vasilis Luhman, MSW, LCSWA Clinical Social Worker [facility] [phone]   

## 2014-04-24 NOTE — Progress Notes (Signed)
[facility] Group Notes:  (Nursing/MHT/Case Management/Adjunct)  Date:  04/24/2014  Time:  1:36 AM  Type of Therapy:  Psychoeducational Skills  Participation Level:  Active  Participation Quality:  Appropriate  Affect:  Appropriate  Cognitive:  Appropriate  Insight:  Appropriate  Engagement in Group:  Engaged  Modes of Intervention:  Education  Summary of Progress/Problems: The patient indicated that he had a good day since he was able to socialize with one of his male peers. In terms of the theme for the day, he intends to talk to his family as a coping skill.   Devony Mcgrady S 04/24/2014, 1:36 AM

## 2014-04-25 DIAGNOSIS — F1994 Other psychoactive substance use, unspecified with psychoactive substance-induced mood disorder: Secondary | ICD-10-CM

## 2014-04-25 MED ORDER — OMEPRAZOLE 40 MG PO CPDR: 80.0000 mg | DELAYED_RELEASE_CAPSULE | Freq: Every day | ORAL | Status: DC

## 2014-04-25 MED ORDER — TRAZODONE HCL 100 MG PO TABS: 100.0000 mg | ORAL_TABLET | Freq: Every evening | ORAL | Status: DC | PRN

## 2014-04-25 MED ORDER — DIVALPROEX SODIUM 500 MG PO DR TAB: 500.0000 mg | DELAYED_RELEASE_TABLET | Freq: Two times a day (BID) | ORAL | Status: DC

## 2014-04-25 MED ORDER — CLONIDINE HCL 0.1 MG PO TABS: 0.1000 mg | ORAL_TABLET | Freq: Every day | ORAL | Status: DC

## 2014-04-25 NOTE — Plan of Care (Signed)
Problem: Ineffective individual coping Goal: STG-Increase in ability to manage activities of daily living Outcome: Adequate for Discharge Pt performs ADLs without prompting

## 2014-04-25 NOTE — Plan of Care (Signed)
Problem: Ineffective individual coping Goal: STG: Patient will remain free from self harm Outcome: Adequate for Discharge Pt has been compliant with medications and has remained safe throughout hospitalization

## 2014-04-25 NOTE — ED Provider Notes (Signed)
Medical screening examination/treatment/procedure(s) were conducted as a shared visit with non-physician practitioner(s) and myself.  I personally evaluated the patient during the encounter.  Pt c/o depression, SI.  Alert, content. Depressed mood. +SI.  Labs. Psych team consulted - dispo per psych team.   Suzi Roots, MD 04/25/14 [phone]

## 2014-04-25 NOTE — Discharge Summary (Signed)
Physician Discharge Summary Note  Patient:  Russell [name] is an 43 y.o., male MRN:  [id number] DOB:  04-03-1971 Patient phone:  [phone] (home)  Patient address:   [address] [place] [address],  Total Time spent with patient: Greater than 30 minutes  Date of Admission:  04/19/2014  Date of Discharge: 04/25/14  Reason for Admission: Mood stabilization  Discharge Diagnoses: Active Problems:   Substance induced mood disorder   Depression   Benzodiazepine dependence   Psychiatric Specialty Exam: Physical Exam  Psychiatric: His speech is normal and behavior is normal. Judgment and thought content normal. His mood appears not anxious. His affect is not angry, not blunt, not labile and not inappropriate. Cognition and memory are normal. He does not exhibit a depressed mood.    Review of Systems  Constitutional: Negative.   HENT: Negative.   Eyes: Negative.   Respiratory: Negative.   Cardiovascular: Negative.   Gastrointestinal: Negative.   Genitourinary: Negative.   Musculoskeletal: Negative.   Skin: Negative.   Neurological: Negative.   Endo/Heme/Allergies: Negative.   Psychiatric/Behavioral: Positive for depression (Stabilized with medication prior to discharge) and substance abuse (Benzodiazepine dependence). Negative for suicidal ideas, hallucinations and memory loss. The patient has insomnia (Stabilized with medication prior to discharge). The patient is not nervous/anxious.     Blood pressure 134/84, pulse 83, temperature 99.5 F (37.5 C), temperature source Oral, resp. rate 18, height  (1.854 m), weight 92.987 kg (205 lb), SpO2 98.00%.Body mass index is 27.05 kg/(m^2).   General Appearance: Well Groomed   Patent attorney:: Good   Speech: Normal Rate   Volume: Normal   Mood: Euthymic   Affect: Congruent   Thought Process: Goal Directed and Linear   Orientation: Full (Time, Place, and Person)   Thought Content: no hallucinations,no delusions    Suicidal Thoughts: No- no suicidal or homicidal ideations   Homicidal Thoughts: No   Memory: NA   Judgement: Good   Insight: Fair   Psychomotor Activity: Normal   Concentration: Good   Recall: Good   Fund of Knowledge:Good   Language: Good   Akathisia: Negative   Handed: Right   AIMS (if indicated):   Assets: Communication Skills  Desire for Improvement  Resilience   Sleep: Number of Hours: 6    Past Psychiatric History: Diagnosis: Opioid Disorder - Severe (304.00), Benzodiazepine dependence Substance induced mood disorder  Hospitalizations: [facility] adult unit  Outpatient Care: [facility]  Substance Abuse Care: [facility]  Self-Mutilation: NA  Suicidal Attempts: NA  Violent Behaviors: NA   Musculoskeletal: Strength & Muscle Tone: within normal limits Gait & Station: normal Patient leans: N/A  DSM5: Schizophrenia Disorders:  NA Obsessive-Compulsive Disorders:  NA Trauma-Stressor Disorders:  NA Substance/Addictive Disorders:  Opioid Disorder - Severe (304.00), benzodiazepine dependence Depressive Disorders:  Substance induced mood disorder  Axis Diagnosis:  AXIS I:  Opioid Disorder - Severe (304.00), Benzodiazepine dependence Substance induced mood disorder AXIS II:  Deferred AXIS III:   Past Medical History  Diagnosis Date  . Hernia   . Bipolar 1 disorder   . Epilepsy   . Schizophrenia    AXIS IV:  economic problems, occupational problems, other psychosocial or environmental problems and Polysubstance dependence AXIS V:  62  Level of Care:  OP  Hospital Course:  Russell [name] is 43 year old, Caucasian male. He reports, "I don't know, but I have been feeling very emotional lately. It could due to build up of things. I have been dealing with pain  issues associated with a reconstruction of my right ankle. I also have a epilepsy. I lost my driving license, been unemployed because of my ill health. I have been on pain medicines and Xanax pills  for my pain and anxiety problems. Xanax helps me with my sleep. I had tried to stop the pain pills, but got very sick with the withdrawals. I don't remember much. I blackout a lot. I don't think I have alcohol problems. Although I can't remember when I drank last".  Russell [name] was admitted to the hospital with his UDS test reports showing positive cocaine and opioid. He was presenting with the opioid withdrawal symptoms as well as depressed mood. He was in need of drug detox and mood stabilization treatments. His detoxification treatments were achieved using both clonidine/Librium detox protocols. And for his mood stabilization treatments, Russell [name] was ordered, received and discharged on; Depakote ER 500 mg twice daily for mood stabilization/seizure activity disorder and Trazodone 100 mg Q bedtime for insomnia. Other than seizure activity disorder, he presented no other significant health issues that required treatments and or monitoring. He tolerated his treatment regimen without any significant adverse effects and or reactions. Russell [name] was also enrolled in the group counseling sessions and AA/NA meetings being offered and held on this unit. He learned coping skills that should help him cope better after discharge to maintain mood stability and sobriety.   Russell [name] has completed detox treatments and his mood is stabilized. This is evidenced by his reports of improved mood and absence of substance withdrawal symptoms. He is currently being discharged to continue substance abuse treatment, routine psychiatric care and medication management at the [facility] here in [place], [place]. He is provided with all the necessary information required to make this appointment without problems.   Upon discharge, he adamantly denies any SIHI, AVH, delusional thoughts, paranoia and or withdrawal symptoms. He received from the [facility] pharmacy, a 14 days worth, supply samples of his [facility] discharge  medications. He left [facility] with all personal belongings in no apparent distress. Transportation per family.   Consults:  psychiatry  Significant Diagnostic Studies:  labs: CBC with diff, CMP, UDS, toxicology tests, U/A, Depakote levels  Discharge Vitals:   Blood pressure 134/84, pulse 83, temperature 99.5 F (37.5 C), temperature source Oral, resp. rate 18, height  (1.854 m), weight 92.987 kg (205 lb), SpO2 98.00%. Body mass index is 27.05 kg/(m^2). Lab Results:   No results found for this or any previous visit (from the past 72 hour(s)).  Physical Findings: AIMS: Facial and Oral Movements Muscles of Facial Expression: None, normal Lips and Perioral Area: None, normal Jaw: None, normal Tongue: None, normal,Extremity Movements Upper (arms, wrists, hands, fingers): None, normal Lower (legs, knees, ankles, toes): None, normal, Trunk Movements Neck, shoulders, hips: None, normal, Overall Severity Severity of abnormal movements (highest score from questions above): None, normal Incapacitation due to abnormal movements: None, normal Patient's awareness of abnormal movements (rate only patient's report): No Awareness, Dental Status Current problems with teeth and/or dentures?: No Does patient usually wear dentures?: No  CIWA:  CIWA-Ar Total: 1 COWS:  COWS Total Score: 2  Psychiatric Specialty Exam: See Psychiatric Specialty Exam and Suicide Risk Assessment completed by Attending Physician prior to discharge.  Discharge destination:  Home  Is patient on multiple antipsychotic therapies at discharge:  No   Has Patient had three or more failed trials of antipsychotic monotherapy by history:  No  Recommended Plan for Multiple Antipsychotic Therapies: NA  Medication List    STOP taking these medications       alprazolam 2 MG tablet  Commonly known as:  XANAX     benztropine 0.5 MG tablet  Commonly known as:  COGENTIN     divalproex 500 MG 24 hr tablet  Commonly known as:   DEPAKOTE ER  Replaced by:  divalproex 500 MG DR tablet     HYDROcodone-acetaminophen 5-325 MG per tablet  Commonly known as:  NORCO/VICODIN     imipramine 50 MG tablet  Commonly known as:  TOFRANIL     prazosin 2 MG capsule  Commonly known as:  MINIPRESS      TAKE these medications     Indication   divalproex 500 MG DR tablet  Commonly known as:  DEPAKOTE  Take 1 tablet (500 mg total) by mouth 2 (two) times daily with a meal. For Seizure   Indication:  Seizure Disorder     omeprazole 40 MG capsule  Commonly known as:  PRILOSEC  Take 2 capsules (80 mg total) by mouth daily. For acid reflux   Indication:  Gastroesophageal Reflux Disease     traZODone 100 MG tablet  Commonly known as:  DESYREL  Take 1 tablet (100 mg total) by mouth at bedtime as needed and may repeat dose one time if needed for sleep.   Indication:  Trouble Sleeping       Follow-up Information   Follow up with [facility] On 05/04/2014. (Please present to therapy appointment with Larita Fife on this date at 4 pm. Please call office if you need to reschedule appointment.)    Contact information:   Address: [address] [place], [place] [address] [phone]      Follow up with [facility] On 05/16/2014. (Please present to medication management appointment with Dr. Lance Coon on this date at 3:15 pm. Please call office if you need to reschedule appointment.)    Contact information:   Address: [address] [place], [place] [address] Phone:([phone]     Follow-up recommendations: Activity:  As tolerated Diet: As recommended by your primary care doctor. Keep all scheduled follow-up appointments as recommended.    Comments: Take all your medications as prescribed by your mental healthcare provider. Report any adverse effects and or reactions from your medicines to your outpatient provider promptly. Patient is instructed and cautioned to not engage in alcohol and or illegal drug use while  on prescription medicines. In the event of worsening symptoms, patient is instructed to call the crisis hotline, 911 and or go to the nearest ED for appropriate evaluation and treatment of symptoms. Follow-up with your primary care provider for your other medical issues, concerns and or health care needs.   Total Discharge Time:  Greater than 30 minutes.  Signed: Sanjuana Kava, PMHNP 04/25/2014, 3:27 PM   Patient seen, Suicide Assessment Completed.  Disposition Plan Reviewed

## 2014-04-25 NOTE — Progress Notes (Signed)
Discharge Note:  Patient discharged home with wife/daughter.  Denied SI and HI.  Denied A/V hallucinations.  Patient received all his belongings, clothing, misc items, toiletries, prescriptions, medications.  Suicide prevention information given and discussed with patient who stated he understood and had no questions.  Patient stated he appreciated all assistance received from [facility] staff.

## 2014-04-25 NOTE — Progress Notes (Signed)
D:  Patient's self inventory sheet, patient slept good last night, sleep medication was helpful.  Normal energy level, good concentration.  Denied depression, hopeless 1, anxiety 2.  Continues to experience tremors.  Denied SI.  Denied physical problems.  Goal today is to work on "positive thinking".  Plans to go back to his family after discharge.  Does have discharge plans.   A:  Medications administered per MD orders.  Emotional support and encouragement.  R:  Denied SI and HI, contracts for safety.  Denied A/V hallucinations.  Safety maintained with 15 minute checks.

## 2014-04-25 NOTE — Progress Notes (Signed)
D   Pt reports feeling ready for discharge and reports an improved mood    He denies suicidal ideation  He attends groups and interacts well with others A   Verbal support and encouragement given   Medications administered and effectiveness monitored   Q 15 min checks  R   Pt safe at present

## 2014-04-25 NOTE — [facility] Suicide Risk Assessment (Signed)
   Demographic Factors:  43 year old man, married, has two children  Total Time spent with patient: 30 minutes  Psychiatric Specialty Exam: Physical Exam  ROS  Blood pressure 131/87, pulse 89, temperature 98 F (36.7 C), temperature source Oral, resp. rate 16, height  (1.854 m), weight 92.987 kg (205 lb), SpO2 98.00%.Body mass index is 27.05 kg/(m^2).  General Appearance: Well Groomed  Patent attorney::  Good  Speech:  Normal Rate  Volume:  Normal  Mood:  Euthymic  Affect:  Congruent  Thought Process:  Goal Directed and Linear  Orientation:  Full (Time, Place, and Person)  Thought Content:  no hallucinations,no delusions  Suicidal Thoughts:  No- no suicidal or homicidal ideations  Homicidal Thoughts:  No  Memory:  NA  Judgement:  Good  Insight:  Fair  Psychomotor Activity:  Normal  Concentration:  Good  Recall:  Good  Fund of Knowledge:Good  Language: Good  Akathisia:  Negative  Handed:  Right  AIMS (if indicated):     Assets:  Communication Skills Desire for Improvement Resilience  Sleep:  Number of Hours: 6    Musculoskeletal: Strength & Muscle Tone: within normal limits- no symptoms of withdrawal at this time Gait & Station: normal Patient leans: N/A   Mental Status Per Nursing Assessment::   On Admission:  Suicidal ideation indicated by others  Current Mental Status by Physician: At this time patient is euthymic, with a full range of affect, no thought disorder, not suicidal , not homicidal , not psychotic, future orientede  Loss Factors: Medical issues- recent seizures / blackouts reported  Historical Factors: history of seizures/ history of substance abuse  Risk Reduction Factors:   Responsible for children under 69 years of age, Sense of responsibility to family, Living with another person, especially a relative and Positive social support  Continued Clinical Symptoms:  At this time patient euthymic, with full range of affect. No thought disorder.  No SI or HI. Of note, during this admission patient had no seizures/ no blackouts.  Cognitive Features That Contribute To Risk:  No gross cognitive deficits upon discharge- fully alert , attentive, 0x3   Suicide Risk:  Mild:  Suicidal ideation of limited frequency, intensity, duration, and specificity.  There are no identifiable plans, no associated intent, mild dysphoria and related symptoms, good self-control (both objective and subjective assessment), few other risk factors, and identifiable protective factors, including available and accessible social support.  Discharge Diagnoses:   AXIS I:  Opioid dependence, Benzodiazepine dependence  AXIS II:  Deferred AXIS III:   Past Medical History  Diagnosis Date  . Hernia   . Bipolar 1 disorder   . Epilepsy   . Schizophrenia    AXIS IV:  health issues.  Legal issues AXIS V:  61-70 mild symptoms  Plan Of Care/Follow-up recommendations:  Activity:  As tolerated Diet:  Regular Tests:  NA Other:  See below  Is patient on multiple antipsychotic therapies at discharge:  No   Has Patient had three or more failed trials of antipsychotic monotherapy by history:  No  Recommended Plan for Multiple Antipsychotic Therapies: NA  Has an established Neurologist- Dr. Marisa Sprinkles, [facility].  Plans to follow up at [facility] for ongoing psychiatric management. Has an established PCP at [facility], [facility] 04/25/2014, 10:22 AM

## 2014-04-25 NOTE — Plan of Care (Signed)
Problem: Ineffective individual coping Goal: LTG: Patient will report a decrease in negative feelings Outcome: Adequate for Discharge Pt makes more positive statement and said he feels more hopeful  Problem: Alteration in mood Goal: LTG-Patient reports reduction in suicidal thoughts (Patient reports reduction in suicidal thoughts and is able to verbalize a safety plan for whenever patient is feeling suicidal)  Outcome: Adequate for Discharge Pt denies suicidal ideation and reports more hopefullness  Goal: STG-Patient reports thoughts of self-harm to staff Outcome: Adequate for Discharge Pt denies suicidal ideation and said he is ready for discharge  Problem: Alteration in thought process Goal: LTG-Patient verbalizes understanding importance med regimen (Patient verbalizes understanding of importance of medication regimen and need to continue outpatient care.)  Outcome: Adequate for Discharge Pt is compliant with medications and understands why he takes them

## 2014-04-25 NOTE — Progress Notes (Signed)
The focus of this group is to educate the patient on the purpose and policies of crisis stabilization and provide a format to answer questions about their admission.  The group details unit policies and expectations of patients while admitted.  Patient attended 0900 nurse education orientation group this morning.  Patient actively participated, appropriate affect, alert, appropriate insight and engagement.  Today patient will work on 3 goals for discharge.  

## 2014-04-25 NOTE — Plan of Care (Signed)
Problem: Ineffective individual coping Goal: STG: Pt will be able to identify effective and ineffective STG: Pt will be able to identify effective and ineffective coping patterns  Outcome: Adequate for Discharge Pt attends and participates in groups

## 2014-04-25 NOTE — Progress Notes (Signed)
Recreation Therapy Notes   Animal-Assisted Activity/Therapy (AAA/T) Program Checklist/Progress Notes  Patient Eligibility Criteria Checklist & Daily Group note for Rec Tx Intervention  Date: 09.08.2015 Time: 2:45pm Location: [address]   AAA/T Program Assumption of Risk Form signed by Patient/ or Parent Legal Guardian Yes  Patient is free of allergies or sever asthma  Yes  Patient reports no fear of animals Yes  Patient reports no history of cruelty to animals Yes   Patient understands his/her participation is voluntary Yes  Patient washes hands before animal contact Yes  Patient washes hands after animal contact Yes  Behavioral Response: Engaged, Appropriate   Education: Communication, Charity fundraiser, Appropriate Animal Interaction   Education Outcome: Acknowledges education    Clinical Observations/Feedback:  Patient interacted appropriately with therapeutic dog team and peers during session, petting therapy dog appropriately and sharing stories about pets he has at home.  Marykay Lex Gowri Suchan, LRT/CTRS  Yury Schaus L 04/25/2014 4:09 PM

## 2014-04-25 NOTE — Progress Notes (Signed)
[facility] Adult Case Management Discharge Plan :  Will you be returning to the same living situation after discharge: Yes,  patient will be returning home at discharge At discharge, do you have transportation home?:Yes,  patient will have transportation via family Do you have the ability to pay for your medications:Yes,  patient will be provided with medication samples and prescriptions at discharge.  Release of information consent forms completed and in the chart;  Patient's signature needed at discharge.  Patient to Follow up at: Follow-up Information   Follow up with [facility] On 05/04/2014. (Please present to therapy appointment with Larita Fife on this date at 4 pm. Please call office if you need to reschedule appointment.)    Contact information:   Address: [address] [place], [place] [address] [phone]      Follow up with [facility] On 05/16/2014. (Please present to medication management appointment with Dr. Lance Coon on this date at 3:15 pm. Please call office if you need to reschedule appointment.)    Contact information:   Address: [address] [place], [place] [address] [phone]      Patient denies SI/HI:   Yes,  denies    Safety Planning and Suicide Prevention discussed:  Yes,  with patient and wife  Felica Chargois, [place] 04/25/2014, 10:33 AM

## 2014-04-25 NOTE — Tx Team (Signed)
Interdisciplinary Treatment Plan Update (Adult) Date: 04/25/2014   Time Reviewed: 9:30 AM  Progress in Treatment: Attending groups: No Participating in groups: No Taking medication as prescribed: Yes Tolerating medication: Yes Family/Significant other contact made: Yes, CSW has spoken with wife Patient understands diagnosis: Yes Discussing patient identified problems/goals with staff: Yes Medical problems stabilized or resolved: Yes Denies suicidal/homicidal ideation: Yes Issues/concerns per patient self-inventory: Yes Other:  New problem(s) identified: N/A  Discharge Plan or Barriers: Anticipated discharge for today 9/8. Patient will be discharging back to his home to follow up with [facility] for therapy and medication management.  Reason for Continuation of Hospitalization:  Depression Anxiety Medication Stabilization   Comments: N/A  Estimated length of stay: Discharge today 9/8  For review of initial/current patient goals, please see plan of care.  Attendees: Patient:    Family:    Physician: Dr. Jama Flavors; Dr. Dub Mikes 04/18/2014 9:30 AM  Nursing: Neill Loft, Arville Lime., RN 04/18/2014 9:30 AM  Clinical Social Worker: Samuella Bruin,  LCSWA 04/18/2014 9:30 AM  Other: Juline Patch, LCSW 04/18/2014 9:30 AM  Other: Leisa Lenz, Vesta Mixer Liaison 04/18/2014 9:30 AM  Other: Onnie Boer, Case Manager 04/18/2014 9:30 AM     Other:    Other:    Other:    Other:    Other:        Scribe for Treatment Team:  Samuella Bruin, MSW, [employer] [phone]

## 2014-04-28 NOTE — Progress Notes (Signed)
Patient Discharge Instructions:  After Visit Summary (AVS):   Faxed to:  04/28/14 Discharge Summary Note:   Faxed to:  04/28/14 Psychiatric Admission Assessment Note:   Faxed to:  04/28/14 Suicide Risk Assessment - Discharge Assessment:   Faxed to:  04/28/14 Faxed/Sent to the Next Level Care provider:  04/28/14 Faxed to [facility] @ [phone]  Jerelene Redden, 04/28/2014, 4:06 PM

## 2014-05-31 ENCOUNTER — Encounter (HOSPITAL_COMMUNITY): Payer: Self-pay | Admitting: Emergency Medicine

## 2014-05-31 ENCOUNTER — Emergency Department (HOSPITAL_COMMUNITY)
Admission: EM | Admit: 2014-05-31 | Discharge: 2014-05-31 | Disposition: A | Discharge status: Home / Self Care | Payer: Medicaid Other | Attending: Emergency Medicine | Admitting: Emergency Medicine

## 2014-05-31 ENCOUNTER — Emergency Department (HOSPITAL_COMMUNITY): Payer: Medicaid Other

## 2014-05-31 DIAGNOSIS — Y288XXA Contact with other sharp object, undetermined intent, initial encounter: Secondary | ICD-10-CM | POA: Diagnosis not present

## 2014-05-31 DIAGNOSIS — F319 Bipolar disorder, unspecified: Secondary | ICD-10-CM | POA: Insufficient documentation

## 2014-05-31 DIAGNOSIS — Y9389 Activity, other specified: Secondary | ICD-10-CM | POA: Insufficient documentation

## 2014-05-31 DIAGNOSIS — S61412A Laceration without foreign body of left hand, initial encounter: Secondary | ICD-10-CM | POA: Insufficient documentation

## 2014-05-31 DIAGNOSIS — Z8719 Personal history of other diseases of the digestive system: Secondary | ICD-10-CM | POA: Insufficient documentation

## 2014-05-31 DIAGNOSIS — Z720 Tobacco use: Secondary | ICD-10-CM | POA: Diagnosis not present

## 2014-05-31 DIAGNOSIS — R200 Anesthesia of skin: Secondary | ICD-10-CM | POA: Diagnosis not present

## 2014-05-31 DIAGNOSIS — Z79899 Other long term (current) drug therapy: Secondary | ICD-10-CM | POA: Diagnosis not present

## 2014-05-31 DIAGNOSIS — G40909 Epilepsy, unspecified, not intractable, without status epilepticus: Secondary | ICD-10-CM | POA: Insufficient documentation

## 2014-05-31 MED ORDER — HYDROCODONE-ACETAMINOPHEN 5-325 MG PO TABS: 1.0000 | ORAL_TABLET | ORAL | Status: DC | PRN

## 2014-05-31 MED ORDER — MELOXICAM 7.5 MG PO TABS: 7.5000 mg | ORAL_TABLET | Freq: Every day | ORAL | Status: DC

## 2014-05-31 NOTE — ED Provider Notes (Signed)
CSN: [id number]     Arrival date & time 05/31/14  1423 History  This chart was scribed for a non-physician practitioner, Junius FinnerErin O'Malley PA-C, working with Linwood DibblesJon Knapp, MD by Julian HyMorgan Graham, ED Scribe. The patient was seen in [facility]. The patient's care was started at 4:09 PM.   Chief Complaint  Patient presents with  . Extremity Laceration    HPI HPI Comments: Russell [name] is a 43 y.o. male who presents to the Emergency Department complaining of acute, moderate, gradually worsening left hand laceration onset one day ago, around 4PM. Pt has associated pain, weakness, and numbness. Pt describes his pain as shooting and throbbing in the pinky. Pt states he was carving into a tree and the razor blade slipped and cut his left palm underneath his pinky. Bleeding is well-controlled at this time. Pt notes his pain is worsened with ROM. Pt denies any other issues at this time. Tetanus UTD.   Reports having been seen by Dr. 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   Past Medical History  Diagnosis Date  . Hernia   . Bipolar 1 disorder   . Epilepsy   . Schizophrenia    Past Surgical History  Procedure Laterality Date  . Ankle fracture surgery  1994 - approximate  . Ligament repair  1998    right arm   No family history on file. History  Substance Use Topics  . Smoking status: Current Some Day Smoker    Types: Cigarettes  . Smokeless tobacco: Never Used  . Alcohol Use: Yes     Comment: socially    Review of Systems  Constitutional: Negative for fever and chills.  Respiratory: Negative for shortness of breath.   Gastrointestinal: Negative for nausea and vomiting.  Musculoskeletal: Positive for arthralgias.  Skin: Positive for wound.  Neurological: Negative for weakness.      Allergies  Ultram  Home Medications   Prior to Admission medications   Medication Sig Start Date End Date Taking? Authorizing Provider  divalproex (DEPAKOTE) 500 MG  DR tablet Take 1 tablet (500 mg total) by mouth 2 (two) times daily with a meal. For Seizure 04/25/14  Yes Sanjuana KavaAgnes I Nwoko, NP  ibuprofen (ADVIL,MOTRIN) 200 MG tablet Take 400 mg by mouth daily as needed for moderate pain (pain).   Yes Historical Provider, MD  omeprazole (PRILOSEC) 40 MG capsule Take 2 capsules (80 mg total) by mouth daily. For acid reflux 04/25/14  Yes Sanjuana KavaAgnes I Nwoko, NP  HYDROcodone-acetaminophen (NORCO/VICODIN) 5-325 MG per tablet Take 1-2 tablets by mouth every 4 (four) hours as needed for moderate pain or severe pain. 05/31/14   Junius FinnerErin O'Malley, PA-C  meloxicam (MOBIC) 7.5 MG tablet Take 1 tablet (7.5 mg total) by mouth daily. 05/31/14   Junius FinnerErin O'Malley, PA-C   Triage Vitals: BP 132/80  Pulse 88  Temp(Src) 98 F (36.7 C) (Oral)  Resp 23  SpO2 98%  Physical Exam  Nursing note and vitals reviewed. Constitutional: He is oriented to person, place, and time. He appears well-developed and well-nourished.  HENT:  Head: Normocephalic and atraumatic.  Eyes: EOM are normal.  Neck: Normal range of motion.  Cardiovascular: Normal rate.   Pulmonary/Chest: Effort normal.  Musculoskeletal: Normal range of motion.  Left hand: 4/5 grip strength. Initially decreased ROM left pinky, however full passive range of motion, pt was then able to have full active ROM.  Decreased thumb to pinky opposition due to pain.   Neurological: He is  alert and oriented to person, place, and time.  Decreased sensation to light touch along radial aspect left pinky  Skin: Skin is warm and dry.  1cm laceration to left hand, palmer aspect, base of ring and pinky finger.  No active bleeding.   Psychiatric: He has a normal mood and affect. His behavior is normal.   Palmer aspect left hand, base of 4th and 5th metacarpal.    ED Course  Procedures   LACERATION REPAIR Performed by: Junius Finner'MALLEY, Zyion Doxtater A. Authorized by: Ina Homes'MALLEY, Latorie Montesano A. Consent: Verbal consent obtained. Risks and benefits: risks, benefits and  alternatives were discussed Consent given by: patient Patient identity confirmed: provided demographic data Prepped and Draped in normal sterile fashion Wound explored  Laceration Location: left hand, palm aspect  Laceration Length: 1cm  No Foreign Bodies seen or palpated  Anesthesia: none  Irrigation method: syringe Amount of cleaning: standard  Skin closure: close, simple  Number: 1  Technique: steri-strip  Patient tolerance: Patient tolerated the procedure well with no immediate complications.   DIAGNOSTIC STUDIES: Oxygen Saturation is 98% on RA, normal by my interpretation.    COORDINATION OF CARE: 4:13 PM- Patient informed of current plan for treatment and evaluation and agrees with plan at this time.  Labs Review Labs Reviewed - No data to display  Imaging Review Dg Hand Complete Left 05/31/2014   CLINICAL DATA:  Laceration across the fourth and fifth metacarpals. Cut with a razor blade 1 day ago. Pain in the fifth phalanx. Initial encounter.  EXAM: LEFT HAND - COMPLETE 3+ VIEW  COMPARISON:  None.  FINDINGS: Soft tissue swelling is present over the dorsum of the hand. There is no underlying fracture. No radiopaque foreign body is evident.  IMPRESSION: Soft tissue swelling over the dorsum of the hand without underlying fracture or radiopaque foreign body.   Electronically Signed   By: Gennette Pachris  Mattern M.D.   On: 05/31/2014 15:45     EKG Interpretation None      MDM   Final diagnoses:  Hand laceration, left, initial encounter    Pt presenting to ED after laceration to left hand, palmar aspect. Reports numbness to left pinky and difficulty moving little finger.  Incident occurred around 4PM yesterday.   Pt does have FROM left hand, hesitant due to pain. No active bleeding. UTD on tetanus. Plain films: significant for soft tissue swelling over dorsum of hand w/o underlying fracture or radiopaque foreign body.  Pt insistent on f/u with hand surgery, states he has  been seen by Dr. Melvyn Novasrtmann before, who is on-call for hand.  Will consult with hand surgery to ensure pt has close f/u in office.   4:50PM consulted with Dr. Melvyn Novasrtmann, hand surgery, who agreed to f/u with pt in the office on Friday, 10/16.   Home care instructions provided. Rx: norco. Return precautions provided. Pt verbalized understanding and agreement with tx plan.   I personally performed the services described in this documentation, which was scribed in my presence. The recorded information has been reviewed and is accurate.    Junius FinnerErin O'Malley, PA-C 05/31/14 [phone]

## 2014-05-31 NOTE — ED Notes (Signed)
PT states that he got a laceration to his lt palm yesterday.  Cut it on a razor blade.  Straight lac across palm underneath pinky.  States lac was very deep however edges are well approximated at this time.

## 2014-06-01 NOTE — ED Provider Notes (Signed)
Medical screening examination/treatment/procedure(s) were performed by non-physician practitioner and as supervising physician I was immediately available for consultation/collaboration.    Nora Sabey, MD 06/01/14 0003 

## 2014-09-03 ENCOUNTER — Encounter (HOSPITAL_COMMUNITY): Payer: Self-pay | Admitting: Emergency Medicine

## 2014-09-03 ENCOUNTER — Emergency Department (HOSPITAL_COMMUNITY)
Admission: EM | Admit: 2014-09-03 | Discharge: 2014-09-05 | Disposition: A | Discharge status: Home / Self Care | Payer: Medicaid Other | Attending: Emergency Medicine | Admitting: Emergency Medicine

## 2014-09-03 DIAGNOSIS — F39 Unspecified mood [affective] disorder: Secondary | ICD-10-CM | POA: Diagnosis not present

## 2014-09-03 DIAGNOSIS — Z720 Tobacco use: Secondary | ICD-10-CM | POA: Diagnosis not present

## 2014-09-03 DIAGNOSIS — F1994 Other psychoactive substance use, unspecified with psychoactive substance-induced mood disorder: Secondary | ICD-10-CM | POA: Diagnosis present

## 2014-09-03 DIAGNOSIS — R45851 Suicidal ideations: Secondary | ICD-10-CM

## 2014-09-03 DIAGNOSIS — Z8719 Personal history of other diseases of the digestive system: Secondary | ICD-10-CM | POA: Diagnosis not present

## 2014-09-03 DIAGNOSIS — F1310 Sedative, hypnotic or anxiolytic abuse, uncomplicated: Secondary | ICD-10-CM | POA: Diagnosis not present

## 2014-09-03 DIAGNOSIS — Z79899 Other long term (current) drug therapy: Secondary | ICD-10-CM | POA: Diagnosis not present

## 2014-09-03 DIAGNOSIS — R251 Tremor, unspecified: Secondary | ICD-10-CM | POA: Diagnosis not present

## 2014-09-03 DIAGNOSIS — F419 Anxiety disorder, unspecified: Secondary | ICD-10-CM | POA: Diagnosis not present

## 2014-09-03 DIAGNOSIS — G40909 Epilepsy, unspecified, not intractable, without status epilepticus: Secondary | ICD-10-CM | POA: Insufficient documentation

## 2014-09-03 DIAGNOSIS — F319 Bipolar disorder, unspecified: Secondary | ICD-10-CM | POA: Diagnosis not present

## 2014-09-03 LAB — COMPREHENSIVE METABOLIC PANEL
ALBUMIN: 4.8 g/dL (ref 3.5–5.2)
ALK PHOS: 83 U/L (ref 39–117)
ALT: 28 U/L (ref 0–53)
AST: 21 U/L (ref 0–37)
Anion gap: 6 (ref 5–15)
BUN: 8 mg/dL (ref 6–23)
CALCIUM: 8.7 mg/dL (ref 8.4–10.5)
CO2: 26 mmol/L (ref 19–32)
CREATININE: 0.87 mg/dL (ref 0.50–1.35)
Chloride: 108 mEq/L (ref 96–112)
GFR calc Af Amer: >90 mL/min (ref >90)
GLUCOSE: 111 mg/dL — AB (ref 70–99)
POTASSIUM: 3.4 mmol/L — AB (ref 3.5–5.1)
Sodium: 140 mmol/L (ref 135–145)
Total Bilirubin: 0.5 mg/dL (ref 0.3–1.2)
Total Protein: 8 g/dL (ref 6.0–8.3)

## 2014-09-03 LAB — VALPROIC ACID LEVEL: Valproic Acid Lvl: 26.6 ug/mL — ABNORMAL LOW (ref 50.0–100.0)

## 2014-09-03 LAB — SALICYLATE LEVEL

## 2014-09-03 LAB — RAPID URINE DRUG SCREEN, HOSP PERFORMED
Amphetamines: NOT DETECTED
BENZODIAZEPINES: POSITIVE — AB
Barbiturates: NOT DETECTED
Cocaine: NOT DETECTED
OPIATES: NOT DETECTED
Tetrahydrocannabinol: NOT DETECTED

## 2014-09-03 LAB — ACETAMINOPHEN LEVEL

## 2014-09-03 LAB — CBC
HEMATOCRIT: 42.5 % (ref 39.0–52.0)
HEMOGLOBIN: 15 g/dL (ref 13.0–17.0)
MCH: 32.7 pg (ref 26.0–34.0)
MCHC: 35.3 g/dL (ref 30.0–36.0)
MCV: 92.6 fL (ref 78.0–100.0)
PLATELETS: 263 10*3/uL (ref 150–400)
RBC: 4.59 MIL/uL (ref 4.22–5.81)
RDW: 12.9 % (ref 11.5–15.5)
WBC: 8 10*3/uL (ref 4.0–10.5)

## 2014-09-03 LAB — ETHANOL: Alcohol, Ethyl (B): 149 mg/dL — ABNORMAL HIGH (ref 0–9)

## 2014-09-03 MED ORDER — HYDROCODONE-ACETAMINOPHEN 5-325 MG PO TABS: 1.0000 | ORAL_TABLET | ORAL | Status: DC | PRN

## 2014-09-03 MED ORDER — OXYCODONE-ACETAMINOPHEN 10-325 MG PO TABS: 1.0000 | ORAL_TABLET | Freq: Four times a day (QID) | ORAL | Status: DC | PRN

## 2014-09-03 MED ORDER — OXYCODONE HCL 5 MG PO TABS: 5.0000 mg | ORAL_TABLET | Freq: Four times a day (QID) | ORAL | Status: DC | PRN

## 2014-09-03 MED ORDER — OXYCODONE-ACETAMINOPHEN 5-325 MG PO TABS
1.0000 | ORAL_TABLET | Freq: Four times a day (QID) | ORAL | Status: DC | PRN
  Administered 2014-09-03: 1 via ORAL
  Filled 2014-09-03: qty 1

## 2014-09-03 MED ORDER — IBUPROFEN 200 MG PO TABS: 400.0000 mg | ORAL_TABLET | Freq: Every day | ORAL | Status: DC | PRN

## 2014-09-03 MED ORDER — MELOXICAM 7.5 MG PO TABS
7.5000 mg | ORAL_TABLET | Freq: Every day | ORAL | Status: DC | PRN
  Filled 2014-09-03: qty 1

## 2014-09-03 MED ORDER — GUANFACINE HCL 2 MG PO TABS
2.0000 mg | ORAL_TABLET | Freq: Two times a day (BID) | ORAL | Status: DC
  Administered 2014-09-03 – 2014-09-05 (×4): 2 mg via ORAL
  Filled 2014-09-03 (×6): qty 1

## 2014-09-03 MED ORDER — PANTOPRAZOLE SODIUM 40 MG PO TBEC
40.0000 mg | DELAYED_RELEASE_TABLET | Freq: Every day | ORAL | Status: DC
  Administered 2014-09-04 – 2014-09-05 (×2): 40 mg via ORAL
  Filled 2014-09-03 (×2): qty 1

## 2014-09-03 MED ORDER — ALPRAZOLAM 1 MG PO TABS
2.0000 mg | ORAL_TABLET | Freq: Three times a day (TID) | ORAL | Status: DC | PRN
  Administered 2014-09-03: 2 mg via ORAL
  Filled 2014-09-03: qty 4

## 2014-09-03 MED ORDER — DIVALPROEX SODIUM 500 MG PO DR TAB
500.0000 mg | DELAYED_RELEASE_TABLET | Freq: Two times a day (BID) | ORAL | Status: DC
  Administered 2014-09-04 – 2014-09-05 (×3): 500 mg via ORAL
  Filled 2014-09-03 (×3): qty 1

## 2014-09-03 MED ORDER — DIVALPROEX SODIUM 500 MG PO DR TAB
500.0000 mg | DELAYED_RELEASE_TABLET | Freq: Once | ORAL | Status: AC
  Administered 2014-09-03: 500 mg via ORAL
  Filled 2014-09-03: qty 1
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
## 2014-09-03 NOTE — ED Provider Notes (Signed)
CSN: [id number]     Arrival date & time 09/03/14  2055 History   First MD Initiated Contact with Patient 09/03/14 2115     Chief Complaint  Patient presents with  . Suicidal      HPI  [facility] w ongoing SI.  No new complaints of physical pain, nor other changes from baseline. He has Hx of seizures, denies recent activity. No plan, but patient has been thinking of suicide frequently. He was sent here due to his seizure Hx, and his Tourette's Syndrome, which manifests with frequent tics, uncomfortable spasms.  In terms of the patient's suicidal ideation, he will not acknowledge a specific plan, but states essentially that he is frustrated with life, ready to be dead.  Past Medical History  Diagnosis Date  . Hernia   . Bipolar 1 disorder   . Epilepsy   . Schizophrenia    Past Surgical History  Procedure Laterality Date  . Ankle fracture surgery  1994 - approximate  . Ligament repair  1998    right arm   No family history on file. History  Substance Use Topics  . Smoking status: Current Some Day Smoker    Types: Cigarettes  . Smokeless tobacco: Never Used  . Alcohol Use: Yes     Comment: socially    Review of Systems  Neurological: Positive for tremors and seizures.  Psychiatric/Behavioral: Positive for suicidal ideas.      Allergies  Ultram  Home Medications   Prior to Admission medications   Medication Sig Start Date End Date Taking? Authorizing Provider  alprazolam Prudy Feeler(XANAX) 2 MG tablet Take 2 mg by mouth 3 (three) times daily as needed for anxiety.   Yes Historical Provider, MD  divalproex (DEPAKOTE) 500 MG DR tablet Take 1 tablet (500 mg total) by mouth 2 (two) times daily with a meal. For Seizure 04/25/14  Yes Sanjuana KavaAgnes I Nwoko, NP  guanFACINE (TENEX) 2 MG tablet Take 1 tablet by mouth 2 (two) times daily. 06/14/14  Yes Historical Provider, MD  HYDROcodone-acetaminophen (NORCO/VICODIN) 5-325 MG per tablet Take 1-2 tablets by mouth  every 4 (four) hours as needed for moderate pain or severe pain. 05/31/14  Yes Junius FinnerErin O'Malley, PA-C  ibuprofen (ADVIL,MOTRIN) 200 MG tablet Take 400 mg by mouth daily as needed for moderate pain (pain).   Yes Historical Provider, MD  meloxicam (MOBIC) 7.5 MG tablet Take 1 tablet (7.5 mg total) by mouth daily. Patient taking differently: Take 7.5 mg by mouth daily as needed for pain.  05/31/14  Yes Junius FinnerErin O'Malley, PA-C  omeprazole (PRILOSEC) 40 MG capsule Take 2 capsules (80 mg total) by mouth daily. For acid reflux 04/25/14  Yes Sanjuana KavaAgnes I Nwoko, NP  oxyCODONE-acetaminophen (PERCOCET) 10-325 MG per tablet Take 1 tablet by mouth every 6 (six) hours as needed. pain 06/12/14  Yes Historical Provider, MD   BP 143/97 mmHg  Pulse 96  Temp(Src) 98.7 F (37.1 C) (Oral)  Resp 16  SpO2 98% Physical Exam  Constitutional: He is oriented to person, place, and time. He appears well-developed. No distress.  HENT:  Head: Normocephalic and atraumatic.  Eyes: Conjunctivae and EOM are normal.  Cardiovascular: Normal rate and regular rhythm.   Pulmonary/Chest: Effort normal. No stridor. No respiratory distress.  Abdominal: He exhibits no distension.  Musculoskeletal: He exhibits no edema.  Neurological: He is alert and oriented to person, place, and time.  Skin: Skin is warm and dry.  Psychiatric: His affect is blunt. Cognition and memory  are not impaired. He expresses suicidal ideation. He expresses no suicidal plans.  Nursing note and vitals reviewed.   ED Course  Procedures (including critical care time) Labs Review Labs Reviewed  ACETAMINOPHEN LEVEL  CBC  COMPREHENSIVE METABOLIC PANEL  ETHANOL  SALICYLATE LEVEL  URINE RAPID DRUG SCREEN (HOSP PERFORMED)  VALPROIC ACID LEVEL      MDM  Patient with Tourette's syndrome, anxiety, depression presents with suicidal ideation.  Here he is awake, alert, appropriately interactive, but clearly having suicidal thoughts. He is medically clear for further  evaluation by psychiatry.   Gerhard Munch, MD 09/03/14 2229

## 2014-09-03 NOTE — ED Notes (Signed)
Awake. Verbally responsive. A/O x4. Resp even and unlabored. No audible adventitious breath sounds noted. ABC's intact. No behavior problems noted. NAD noted.

## 2014-09-03 NOTE — ED Notes (Signed)
Pt states hx of tourette's.  Pt states that he is having abd pain but it is from his abdominal tics that cause muscle cramping.  States this is normal for him.  Pt takes depakote for seizures but has not had a seizure since March 2015.

## 2014-09-03 NOTE — ED Notes (Signed)
BH at bedside

## 2014-09-03 NOTE — ED Notes (Signed)
[facility] with suicidal ideations.  States "I don't want to live any more".  Denies any specific plan.  States that he has thought of "several" but doesn't have a specific plan.  Pt also states he is homicidal.  Not against anyone in particular but states he just feels angry.  Pt is very calm, cooperative, and is agreeable to med clearance plan.  Pt has hx of seizures and [facility] because of this.  Pt is voluntary at this time.

## 2014-09-03 NOTE — ED Notes (Signed)
Pt and belongings wand via security without any weapons noted. Pt belongings include: red coat, 2 belts, hat, lighter, cigarettes, 2 cell phones, jacket, underwear, socks, shoes, sandals, wallet with $4.75 (counted in front of pt) and credit card x1, 1 pair of pants, shirts, shorts, and shirts. Items placed at NS.

## 2014-09-03 NOTE — BH Assessment (Addendum)
Per Dr. Leona SingletonLockwoood's note: Patient with Tourette's syndrome, anxiety, depression presents with suicidal ideation. Here he is awake, alert, appropriately interactive, but clearly having suicidal thoughts. He is medically clear for further evaluation by psychiatry.  UDS and BAL are pending. [facility] for Opiod Dependence, Benzo Dependence, and Mood Disorder, per discharge summary hx of schizophrenia.  Assessment to commence shortly.    Clista BernhardtNancy Maxmillian Carsey, [facility] Triage Specialist 09/03/2014 10:33 PM

## 2014-09-04 DIAGNOSIS — F1320 Sedative, hypnotic or anxiolytic dependence, uncomplicated: Secondary | ICD-10-CM

## 2014-09-04 DIAGNOSIS — F332 Major depressive disorder, recurrent severe without psychotic features: Secondary | ICD-10-CM

## 2014-09-04 DIAGNOSIS — F1099 Alcohol use, unspecified with unspecified alcohol-induced disorder: Secondary | ICD-10-CM

## 2014-09-04 DIAGNOSIS — R45851 Suicidal ideations: Secondary | ICD-10-CM

## 2014-09-04 MED ORDER — SERTRALINE HCL 50 MG PO TABS
50.0000 mg | ORAL_TABLET | Freq: Every day | ORAL | Status: DC
  Administered 2014-09-04 – 2014-09-05 (×2): 50 mg via ORAL
  Filled 2014-09-04 (×2): qty 1

## 2014-09-04 MED ORDER — LORAZEPAM 1 MG PO TABS: 1.0000 mg | ORAL_TABLET | Freq: Four times a day (QID) | ORAL | Status: DC | PRN

## 2014-09-04 NOTE — BH Assessment (Signed)
Inpt recommended. No [facility] beds currently available. Per TTS staff doing dispositions [place] only facility with adult beds currently. Sent referral for consideration.   Clista BernhardtNancy Mikaele Stecher, [facility] Triage Specialist 09/04/2014 6:13 AM
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
## 2014-09-04 NOTE — Progress Notes (Signed)
Pt referral fax[facility] to the following facilities:  [facility]  [facility]acity: [facility]   [facility]  No Answer: [facility]  Will continue to seek placement.  Chad CordialLauren Carter, LCSWA 09/04/2014 3:11 PM

## 2014-09-04 NOTE — ED Notes (Signed)
Awake. Verbally responsive. A/O x4. Resp even and unlabored. No audible adventitious breath sounds noted. ABC's intact. Sitter at bedside.

## 2014-09-04 NOTE — ED Notes (Signed)
Resting quietly with eye closed. Easily arousable. Verbally responsive. Resp even and unlabored. ABC's intact. No behavior problems noted. NAD noted. Sitter at bedside.  

## 2014-09-04 NOTE — ED Notes (Signed)
Pt taking shower at present, no distress noted, remains SI, HI, will continue to monitor for safety.

## 2014-09-04 NOTE — BH Assessment (Addendum)
[facility] as [facility] continues to be [facility].   Declined [facility], per Drenda FreezeFran they will not consider voluntary pt's/   Clista BernhardtNancy Lonney Revak, [facility] Triage Specialist 09/04/2014 7:57 PM

## 2014-09-04 NOTE — ED Notes (Signed)
Bed: [id number] Expected date:  Expected time:  Means of arrival:  Comments: Hall B 

## 2014-09-04 NOTE — ED Notes (Signed)
Psychiatry at bedside.

## 2014-09-04 NOTE — ED Notes (Signed)
Awake. Verbally responsive. A/O x4. Resp even and unlabored. No audible adventitious breath sounds noted. ABC's intact. Sitter at bedside. 

## 2014-09-04 NOTE — ED Notes (Signed)
Patient pleasant and cooperative since admission to unit.  Started on medications as ordered.

## 2014-09-04 NOTE — ED Notes (Signed)
Psych Team visiting patient .

## 2014-09-04 NOTE — ED Notes (Addendum)
Awake. Verbally responsive. A/O x4. Resp even and unlabored. No audible adventitious breath sounds noted. ABC's intact. Pt given meal to eat.

## 2014-09-05 ENCOUNTER — Inpatient Hospital Stay (HOSPITAL_COMMUNITY)
Admission: AD | Admit: 2014-09-05 | Discharge: 2014-09-13 | DRG: 885 | Disposition: A | Discharge status: Home / Self Care | Payer: Medicaid Other | Source: Intra-hospital | Attending: Psychiatry | Admitting: Psychiatry

## 2014-09-05 ENCOUNTER — Encounter (HOSPITAL_COMMUNITY): Payer: Self-pay | Admitting: Intensive Care

## 2014-09-05 DIAGNOSIS — F909 Attention-deficit hyperactivity disorder, unspecified type: Secondary | ICD-10-CM | POA: Diagnosis present

## 2014-09-05 DIAGNOSIS — F39 Unspecified mood [affective] disorder: Secondary | ICD-10-CM | POA: Diagnosis not present

## 2014-09-05 DIAGNOSIS — F952 Tourette's disorder: Secondary | ICD-10-CM | POA: Diagnosis present

## 2014-09-05 DIAGNOSIS — F200 Paranoid schizophrenia: Secondary | ICD-10-CM | POA: Diagnosis present

## 2014-09-05 DIAGNOSIS — G4700 Insomnia, unspecified: Secondary | ICD-10-CM | POA: Diagnosis present

## 2014-09-05 DIAGNOSIS — Z590 Homelessness: Secondary | ICD-10-CM

## 2014-09-05 DIAGNOSIS — F17210 Nicotine dependence, cigarettes, uncomplicated: Secondary | ICD-10-CM | POA: Diagnosis present

## 2014-09-05 DIAGNOSIS — F332 Major depressive disorder, recurrent severe without psychotic features: Principal | ICD-10-CM | POA: Diagnosis present

## 2014-09-05 DIAGNOSIS — R45851 Suicidal ideations: Secondary | ICD-10-CM | POA: Diagnosis present

## 2014-09-05 DIAGNOSIS — F1120 Opioid dependence, uncomplicated: Secondary | ICD-10-CM | POA: Diagnosis present

## 2014-09-05 DIAGNOSIS — G40909 Epilepsy, unspecified, not intractable, without status epilepticus: Secondary | ICD-10-CM | POA: Diagnosis present

## 2014-09-05 DIAGNOSIS — F250 Schizoaffective disorder, bipolar type: Secondary | ICD-10-CM | POA: Diagnosis present

## 2014-09-05 DIAGNOSIS — K219 Gastro-esophageal reflux disease without esophagitis: Secondary | ICD-10-CM | POA: Diagnosis present

## 2014-09-05 MED ORDER — PANTOPRAZOLE SODIUM 40 MG PO TBEC
40.0000 mg | DELAYED_RELEASE_TABLET | Freq: Every day | ORAL | Status: DC
  Administered 2014-09-06 – 2014-09-13 (×8): 40 mg via ORAL
  Filled 2014-09-05 (×11): qty 1

## 2014-09-05 MED ORDER — LORAZEPAM 1 MG PO TABS
1.0000 mg | ORAL_TABLET | Freq: Four times a day (QID) | ORAL | Status: DC | PRN
  Administered 2014-09-06 – 2014-09-12 (×5): 1 mg via ORAL
  Filled 2014-09-05 (×5): qty 1

## 2014-09-05 MED ORDER — MELOXICAM 7.5 MG PO TABS: 7.5000 mg | ORAL_TABLET | Freq: Every day | ORAL | Status: DC | PRN

## 2014-09-05 MED ORDER — IBUPROFEN 400 MG PO TABS
400.0000 mg | ORAL_TABLET | Freq: Every day | ORAL | Status: DC | PRN
Start: 2014-09-05 — End: 2014-09-13

## 2014-09-05 MED ORDER — GUANFACINE HCL 2 MG PO TABS
2.0000 mg | ORAL_TABLET | Freq: Two times a day (BID) | ORAL | Status: DC
  Administered 2014-09-05 – 2014-09-07 (×5): 2 mg via ORAL
  Filled 2014-09-05 (×8): qty 1

## 2014-09-05 MED ORDER — SERTRALINE HCL 50 MG PO TABS
50.0000 mg | ORAL_TABLET | Freq: Every day | ORAL | Status: DC
  Administered 2014-09-06 – 2014-09-09 (×4): 50 mg via ORAL
  Filled 2014-09-05 (×6): qty 1

## 2014-09-05 MED ORDER — DIVALPROEX SODIUM 500 MG PO DR TAB
500.0000 mg | DELAYED_RELEASE_TABLET | Freq: Two times a day (BID) | ORAL | Status: DC
  Administered 2014-09-05 – 2014-09-13 (×16): 500 mg via ORAL
  Filled 2014-09-05: qty 14
  Filled 2014-09-05 (×18): qty 1
  Filled 2014-09-05: qty 14
  Filled 2014-09-05: qty 1

## 2014-09-05 NOTE — Tx Team (Addendum)
Initial Interdisciplinary Treatment Plan   PATIENT STRESSORS: Financial difficulties Marital or family conflict Medication change or noncompliance Occupational concerns Substance abuse   PATIENT STRENGTHS: Ability for insight Active sense of humor Average or above average intelligence Capable of independent living Communication skills General fund of knowledge Motivation for treatment/growth Physical Health   PROBLEM LIST: Problem List/Patient Goals Date to be addressed Date deferred Reason deferred Estimated date of resolution  "Depression" 09-05-14     Risk for suicide                                                 DISCHARGE CRITERIA:  Ability to meet basic life and health needs Adequate post-discharge living arrangements Improved stabilization in mood, thinking, and/or behavior Medical problems require only outpatient monitoring  PRELIMINARY DISCHARGE PLAN: Attend aftercare/continuing care group Outpatient therapy  PATIENT/FAMIILY INVOLVEMENT: This treatment plan has been presented to and reviewed with the patient, Russell [name].  The patient has been given the opportunity to ask questions and make suggestions.  Nestor RampDUNCAN, TROY MCCOLLUM 09/05/2014, 1:23 PM

## 2014-09-05 NOTE — BH Assessment (Signed)
[facility] per [place] Signal[place]onda due to chronic cognitive impairment, she believes this was referring to his neurological conditions.   Clista BernhardtNancy Naylee Frankowski, [facility] Triage Specialist 09/05/2014 2:54 AM

## 2014-09-05 NOTE — BH Assessment (Signed)
[facility] Assessment Progress Note  [facility] by Thedore MinsMojeed Akintayo, MD.  Per Thurman CoyerEric Kaplan, RN, [facility], pt has been assigned to Rm [id number].  Pt has signed Voluntary Admission and Consent for Treatment, as well as Consent to Release Information to his probation officer and to Ellis SavageLisa Poulos at [facility].  A notification call to [facility] has been placed.  Signed [facility], and pt's nurse has been notified.  She agrees to sent original [facility], and to call report to [phone].  Doylene Canninghomas Gelila Well, MA Triage Specialist 09/05/2014 @ 09:22
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
## 2014-09-05 NOTE — Progress Notes (Signed)
Pt d[facility] from [facility], [facility]. Report called to Arden-Arc[place]oy, RN. D/C instructions reviewed with pt and understanding verbalized. Pt denied SI/HI at present, verbally contracted for safety. Reported AH when assessed this shift, stated he hears "echos". However, pt denied V / H at the time. All belongings in locker 35 given to pt and belonging sheet signed in agreement with items received. Pt was picked up and transported by [facility] transportation services [facility]. Safety maintained on Q 15 minutes checks as per order till time of d/c. No physical distress evident at time of departure from this facility.

## 2014-09-05 NOTE — [facility] Group Notes (Signed)
[facility] LCSW Group Therapy  09/05/2014 3:56 PM  Type of Therapy:  Group Therapy  Participation Level:  Did Not Attend-pt invited-stated he had not eaten lunch yet and was waiting for food. Shared that he would attend group afterward if he was finished.   Summary of Progress/Problems:  Finding Balance in Life. Today's group focused on defining balance in one's own words, identifying things that can knock one off balance, and exploring healthy ways to maintain balance in life. Group members were asked to provide an example of a time when they felt off balance, describe how they handled that situation,and process healthier ways to regain balance in the future. Group members were asked to share the most important tool for maintaining balance that they learned [facility] and how they plan to apply this method after discharge.    Smart, Haani Bakula LCSWA  09/05/2014, 3:56 PM

## 2014-09-05 NOTE — BHH Counselor (Signed)
Adult Comprehensive Assessment  Patient ID: Russell [name], male DOB: 1971/05/17, 44 y.o. MRN: [id number]  Information Source: Information source: Patient  Current Stressors:  Educational / Learning stressors: N/A Employment / Job issues: N/A Family Relationships: N/A Surveyor, quantity / Lack of resources (include bankruptcy): N/A Housing / Lack of housing: N/A Physical health (include injuries & life threatening diseases): blackout and seizures; Diagnosed epilepsy 2 years ago Social relationships: Isolates self, lacks strong social support system Substance abuse: Pain medications 5 mg Hydrocodone 3 times a day, on and off pain medications for 20 years Bereavement / Loss: N/A  Living/Environment/Situation:  Living Arrangements: Spouse/significant other;Children How long has patient lived in current situation?: 11 years What is atmosphere in current home: Temporary/chaotic-pt states that his wife asked him to leave prior to admission to the hospital.   Family History:  Marital status: Married Number of Years Married: 11 What types of issues is patient dealing with in the relationship?: wife is asking pt to leave due to behavior.  Does patient have children?: Yes How many children?: 2 How is patient's relationship with their children?: reports fair relationship with both children.   Childhood History:  By whom was/is the patient raised?: Both parents Description of patient's relationship with caregiver when they were a child: Not close with parents, spent most of his days in athletic sports Patient's description of current relationship with people who raised him/her: Good relationship with parents Does patient have siblings?: Yes Number of Siblings: 2 Description of patient's current relationship with siblings: gets along with both but distant relationship Did patient suffer any verbal/emotional/physical/sexual abuse as a child?: No Did patient suffer from severe childhood  neglect?: No Has patient ever been sexually abused/assaulted/raped as an adolescent or adult?: No Was the patient ever a victim of a crime or a disaster?: No Witnessed domestic violence?: No Has patient been effected by domestic violence as an adult?: No  Education:  Highest grade of school patient has completed: 8th grade Currently a student?: No Learning disability?: Yes What learning problems does patient have?: ADHD  Employment/Work Situation:  Employment situation: Employed 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 How long has patient been employed?: 1 year Patient's job has been impacted by current illness: Yes Describe how patient's job has been impacted: Lost previous job due to seizures What is the longest time patient has a held a job?: 8 years 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 Has patient ever been in the [employer]?: No Has patient ever served in combat?: No  Financial Resources:  Financial resources: Income from employment;Income from spouse; [facility] Medicaid Does patient have a representative payee or guardian?: No  Alcohol/Substance Abuse:  What has been your use of drugs/alcohol within the last 12 months?: Issues with pain medications and suboxone  If attempted suicide, did drugs/alcohol play a role in this?: (Unknown, patient reports that he does not remember the day that he was hospitalized) Alcohol/Substance Abuse Treatment Hx: Denies past history Has alcohol/substance abuse ever caused legal problems?: Yes (DUI when 26)  Social Support System:  Patient's Community Support System: Fair Museum/gallery exhibitions officer System: Mom, dad Type of faith/religion: Uknown How does patient's faith help to cope with current illness?: Unknown  Leisure/Recreation:  Leisure and Hobbies: Systems developer, run, exercise  Strengths/Needs:  What things does the patient do well?: Sports, athletic  In what areas does  patient struggle / problems for patient: being overmedicated; epilepsy  Discharge Plan:  Does patient have access to transportation?:  Yes Will patient be returning to same living situation after discharge?: Yes Currently receiving community mental health services: Yes (From Whom) (Psychiatrist Dr. Gerome SamPoulus and Counselor Misty StanleyLisa at [facility]) If no, would patient like referral for services when discharged?: No Does patient have financial barriers related to discharge medications?: No  Summary/Recommendations:   Patient is a 44 year old Caucasian Male with a diagnosis of Schizoaffective Disorder, Benzodiazepine dependence, Hx paranoid schizophrenia. Patient lives in [place] with his wife and child. Pt presents with SI/HI/AH, (no identified victim), anxiety/depression/mood instability, xanax abuse and alcohol intoxication, and for medication stabilization. Currently pt denies HI/AVH, but continues to endorse SI-passive and able to contract for safety on the unit. Pt has significant hx of suicide attempts/self harming behaviors. Recommendations for pt include:  crisis stabilization, medication evaluation, group therapy, and psycho education in addition to case management for discharge planning. Pt currently goes to [facility] for services. Pt minimally engaging with CSW/Staff at this time and is not able to discuss goals in depth at this time. "I just want to get myself straight." Pt reports that he does not know where he will live at d/c. CSW assessing.   Smart, ArmaHeather LCSWA 09/05/2014

## 2014-09-06 ENCOUNTER — Encounter (HOSPITAL_COMMUNITY): Payer: Self-pay | Admitting: Registered Nurse

## 2014-09-06 DIAGNOSIS — R45851 Suicidal ideations: Secondary | ICD-10-CM

## 2014-09-06 DIAGNOSIS — F332 Major depressive disorder, recurrent severe without psychotic features: Principal | ICD-10-CM
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
## 2014-09-06 NOTE — Progress Notes (Signed)
D: Patient denies HI and A/V hallucinations; patient reports on and off thoughts of SI but contracts for safety; patient reports a lack of sleep and states " I was tossing and turning most of the night and was unable to get to sleep until the morning hours due to anxiety"; patient states " I just need a nap this morning"  A: Monitored q 15 minutes; patient encouraged to attend groups; patient educated about medications; patient given medications per physician orders; patient encouraged to express feelings and/or concerns  R: Patient is flat and sad; patient has been in the bed all morning; patient is isolative towards staff; patient brightens upon approach; ; patient was able to set goal to talk with staff 1:1 when having feelings of SI; patient is taking medications as prescribed and tolerating medications; patient is attending all groups

## 2014-09-06 NOTE — [facility] Suicide Risk Assessment (Signed)
[facility]   Nursing information obtained from:    Demographic factors:   44 year old male, currently unemployed  Current Mental Status:    See below Loss Factors:   unemployed, unable to drive due to loss of driver's license, poor support network Historical Factors:   history of Bipolar Disorder, history of depression, a previous admission last year Risk Reduction Factors:   resilience  Total Time spent with patient: 45 minutes Principal Problem: worsening depression in the context of severe psychosocial stressors Diagnosis:   Patient Active Problem List   Diagnosis Date Noted  . Schizoaffective disorder, bipolar type [F25.0] 09/05/2014  . Opiate dependence [F11.20] 09/04/2014  . Substance induced mood disorder [F19.94] 04/25/2014    Class: Chronic  . Benzodiazepine dependence [F13.20] 04/21/2014  . Suicidal ideation [R45.851] 04/19/2014     Continued Clinical Symptoms:  Alcohol Use Disorder Identification Test Final Score (AUDIT): 10 The "Alcohol Use Disorders Identification Test", Guidelines for Use in Primary Care, Second Edition.  World Science writerHealth Organization [facility](WHO). Score between 0-7:  no or low risk or alcohol related problems. Score between 8-15:  moderate risk of alcohol related problems. Score between 16-19:  high risk of alcohol related problems. Score 20 or above:  warrants further diagnostic evaluation for alcohol dependence and treatment.   CLINICAL FACTORS:  Worsening depression, some suicidal ideations, reports auditory hallucinations, in the context of severe stressors.   Musculoskeletal: Strength & Muscle Tone: within normal limits Gait & Station: normal Patient leans: N/A  Psychiatric Specialty Exam: Physical Exam  ROS  Blood pressure 113/61, pulse 60, temperature 98 F (36.7 C), temperature source Oral, resp. rate 16, height 6\' 1"  (1.854 m), weight 210 lb (95.255 kg), SpO2 98 %.Body mass index is 27.71 kg/(m^2).  General  Appearance: Fairly Groomed  Patent attorneyye Contact::  Fair  Speech:  Normal Rate  Volume:  Normal  Mood:  Depressed  Affect:  Constricted  Thought Process:  Goal Directed and Linear  Orientation:  Full (Time, Place, and Person)  Thought Content:  describes recent auditory hallucinations but at this time does not present internally preoccupied or paranoid, no delusions expressed, ruminative about losses, such as not being able to drive due to history of epilepsy  Suicidal Thoughts:  Yes.  without intent/plan at this time denies any thoughts of hurting self and  contracts for safety on unit   Homicidal Thoughts:  No  Memory:Recent and Remote grossly intact  Judgement:  Fair  Insight:  Fair  Psychomotor Activity:  Decreased  Concentration:  Fair  Recall:  Good  Fund of Knowledge:Good  Language: Good  Akathisia:  Negative  Handed:  Right  AIMS (if indicated):     Assets:  Desire for Improvement Resilience  Sleep:  Number of Hours: 6.5  Cognition: WNL  ADL's:  Impaired     COGNITIVE FEATURES THAT CONTRIBUTE TO RISK:  Closed-mindedness    SUICIDE RISK:   Moderate:  Frequent suicidal ideation with limited intensity, and duration, some specificity in terms of plans, no associated intent, good self-control, limited dysphoria/symptomatology, some risk factors present, and identifiable protective factors, including available and accessible social support.  PLAN OF CARE: Patient will be admitted to inpatient psychiatric unit for stabilization and safety. Will provide and encourage milieu participation. Provide medication management and maked adjustments as needed.  Will follow daily.    Medical Decision Making:  Review of Psycho-Social Stressors (1), Review or order clinical lab tests (1), Established Problem, Worsening (2) and Review of  Medication Regimen & Side Effects (2)  I certify that inpatient services furnished can reasonably be expected to improve the patient's condition.   COBOS,  FERNANDO 09/06/2014, 5:44 PM

## 2014-09-06 NOTE — [facility] Group Notes (Signed)
[facility]   09/06/2014 10:43 AM  Participation Quality:  Invited-DID NOT ATTEND. Pt requested to sleep. "I got no sleep last night."   Smart, Cortnie Ringel LCSWA

## 2014-09-06 NOTE — Progress Notes (Signed)
D:  Pt passive SI-contracts. Pt denies HI/AVH. Pt remained in room majority of the evening.   A: Pt was offered support and encouragement. Pt was given scheduled medications. Pt was encourage to attend groups. Q 15 minute checks were done for safety.   R: Pt is taking medication. Pt has no complaints at this time .Pt receptive to treatment and safety maintained on unit.

## 2014-09-06 NOTE — H&P (Signed)
Psychiatric Admission Assessment Child/Adolescent  Patient Identification: Russell [name] MRN:  [id number] Date of Evaluation:  09/06/2014 Chief Complaint:  MAJOR DEPRESSIVE DISORDER Principal Diagnosis: <principal problem not specified> Diagnosis:   Patient Active Problem List   Diagnosis Date Noted  . Schizoaffective disorder, bipolar type [F25.0] 09/05/2014  . Opiate dependence [F11.20] 09/04/2014  . Substance induced mood disorder [F19.94] 04/25/2014    Class: Chronic  . Benzodiazepine dependence [F13.20] 04/21/2014  . Suicidal ideation [R45.851] 04/19/2014   History of Present Illness:: Patient states he is depressed and having suicidal thoughts.  Stressor for depression is "I have a lot of medical and personal problems."  Patient would not elaborate on what his personal issues were but stated "I have epilepsy, tourette, psychosis, ADHD and Bipolar.  I can't drive cause of my epilepsy and I've lost my license; now I can't work and I don have disability.  Nobody wants to deal with or be around anybody that can't do nothing for their self."  Patient states that he continues to have thoughts of killing himself.  States that he hears voices off and on but denies at this moment.  States that the voices are not command; they sound like a lot of people talking at one time and can't make out what they are saying. States that he also sees shadows sometime but denies at this time.  Patient also endorses paranoia stating that he is afraid that someone is trying to hurt him "I stay afraid all the time; I keep my doors locked and I just feel scared like somebody is trying to get me."  Patient denies homicidal ideation.  Patient has a history of inpatient/outpatient services, and psychotropic.  Out patient services with Noemi Chapel, NP at [facility].  Patient states that he has not seen provider in 2 months.   Patient denies daily alcohol consumption and illicit drug use.    Elements:  Location:   Worsening depression. Quality:  suicidal thoughts. Severity:  sever. Timing:  several days. Associated Signs/Symptoms: Depression Symptoms:  depressed mood, insomnia, hopelessness, suicidal thoughts without plan, anxiety, (Hypo) Manic Symptoms:  Irritable Mood, Anxiety Symptoms:  Excessive Worry, Psychotic Symptoms:  Paranoia, PTSD Symptoms: NA Total Time spent with patient: 1 hour  Past Medical History:  Past Medical History  Diagnosis Date  . Hernia   . Bipolar 1 disorder   . Epilepsy   . Schizophrenia     Past Surgical History  Procedure Laterality Date  . Ankle fracture surgery  1994 - approximate  . Ligament repair  1998    right arm   Family History: History reviewed. No pertinent family history. Social History:  History  Alcohol Use  . Yes    Comment: socially     History  Drug Use No    History   Social History  . Marital Status: Single    Spouse Name: N/A    Number of Children: N/A  . Years of Education: N/A   Social History Main Topics  . Smoking status: Current Some Day Smoker    Types: Cigarettes  . Smokeless tobacco: Never Used  . Alcohol Use: Yes     Comment: socially  . Drug Use: No  . Sexual Activity: None   Other Topics Concern  . None   Social History Narrative   Additional Social History:      Developmental History: Prenatal History: Birth History: Postnatal Infancy: Developmental History: Milestones:  Sit-Up:  Crawl:  Walk:  Speech: School History:  Legal History: Hobbies/Interests:     Musculoskeletal: Strength & Muscle Tone: within normal limits Gait & Station: normal Patient leans: N/A  Psychiatric Specialty Exam: Physical Exam  Psychiatric: His speech is normal and behavior is normal. Cognition and memory are normal. He expresses impulsivity. He exhibits a depressed mood. He expresses suicidal ideation. He expresses no homicidal ideation. He expresses suicidal plans. He expresses no homicidal  plans.    Review of Systems  Psychiatric/Behavioral: Positive for depression and suicidal ideas.    Blood pressure 116/62, pulse 90, temperature 98 F (36.7 C), temperature source Oral, resp. rate 16, height 6' 1"  (1.854 m), weight 95.255 kg (210 lb), SpO2 98 %.Body mass index is 27.71 kg/(m^2).  General Appearance: Casual  Eye Contact::  Good  Speech:  Clear and Coherent and Normal Rate  Volume:  Normal  Mood:  Depressed and Hopeless  Affect:  Depressed and Flat  Thought Process:  Circumstantial and Goal Directed  Orientation:  Full (Time, Place, and Person)  Thought Content:  Hallucinations: Auditory Visual, Paranoid Ideation and Rumination  Patient denies auditory/visual hallucinations at this time  Suicidal Thoughts:  Yes.  with intent/plan  Homicidal Thoughts:  No  Memory:  Immediate;   Good Recent;   Good Remote;   Good  Judgement:  Poor  Insight:  Lacking  Psychomotor Activity:  Decreased  Concentration:  Fair  Recall:  Good  Fund of Knowledge:Good  Language: Good  Akathisia:  No  Handed:  Right  AIMS (if indicated):     Assets:  Communication Skills Desire for Improvement Social Support  ADL's:  Intact  Cognition: WNL  Sleep:  Number of Hours: 6.5     Risk to Self:   Risk to Others:   Prior Inpatient Therapy:   Prior Outpatient Therapy:    Alcohol Screening: 1. How often do you have a drink containing alcohol?: 2 to 4 times a month 2. How many drinks containing alcohol do you have on a typical day when you are drinking?: 3 or 4 3. How often do you have six or more drinks on one occasion?: Monthly Preliminary Score: 3 4. How often during the last year have you found that you were not able to stop drinking once you had started?: Less than monthly 5. How often during the last year have you failed to do what was normally expected from you becasue of drinking?: Less than monthly 6. How often during the last year have you needed a first drink in the morning to  get yourself going after a heavy drinking session?: Less than monthly 7. How often during the last year have you had a feeling of guilt of remorse after drinking?: Less than monthly 8. How often during the last year have you been unable to remember what happened the night before because you had been drinking?: Less than monthly 9. Have you or someone else been injured as a result of your drinking?: No 10. Has a relative or friend or a doctor or another health worker been concerned about your drinking or suggested you cut down?: No Alcohol Use Disorder Identification Test Final Score (AUDIT): 10 Brief Intervention: Patient declined brief intervention  Allergies:   Allergies  Allergen Reactions  . Ultram [Tramadol Hcl] Hives   Lab Results: No results found for this or any previous visit (from the past 48 hour(s)). Current Medications: Current Facility-Administered Medications  Medication Dose Route Frequency Provider Last Rate Last Dose  . divalproex (DEPAKOTE) DR tablet 500 mg  500 mg Oral BID WC Delfin Gant, NP   500 mg at 09/06/14 0747  . guanFACINE (TENEX) tablet 2 mg  2 mg Oral BID Delfin Gant, NP   2 mg at 09/06/14 0747  . ibuprofen (ADVIL,MOTRIN) tablet 400 mg  400 mg Oral Daily PRN Delfin Gant, NP      . LORazepam (ATIVAN) tablet 1 mg  1 mg Oral Q6H PRN Delfin Gant, NP      . meloxicam (MOBIC) tablet 7.5 mg  7.5 mg Oral Daily PRN Delfin Gant, NP      . pantoprazole (PROTONIX) EC tablet 40 mg  40 mg Oral Daily Delfin Gant, NP   40 mg at 09/06/14 0747  . sertraline (ZOLOFT) tablet 50 mg  50 mg Oral Daily Delfin Gant, NP   50 mg at 09/06/14 0747   PTA Medications: Prescriptions prior to admission  Medication Sig Dispense Refill Last Dose  . alprazolam (XANAX) 2 MG tablet Take 2 mg by mouth 3 (three) times daily as needed for anxiety.   09/02/2014 at Unknown time  . divalproex (DEPAKOTE) 500 MG DR tablet Take 1 tablet (500 mg total) by  mouth 2 (two) times daily with a meal. For Seizure 60 tablet 0 09/03/2014 at Unknown time  . guanFACINE (TENEX) 2 MG tablet Take 1 tablet by mouth 2 (two) times daily.  2 09/02/2014 at Unknown time  . HYDROcodone-acetaminophen (NORCO/VICODIN) 5-325 MG per tablet Take 1-2 tablets by mouth every 4 (four) hours as needed for moderate pain or severe pain. 15 tablet 0 unknown  . ibuprofen (ADVIL,MOTRIN) 200 MG tablet Take 400 mg by mouth daily as needed for moderate pain (pain).   unknown  . meloxicam (MOBIC) 7.5 MG tablet Take 1 tablet (7.5 mg total) by mouth daily. (Patient taking differently: Take 7.5 mg by mouth daily as needed for pain. ) 30 tablet 0 unknown  . omeprazole (PRILOSEC) 40 MG capsule Take 2 capsules (80 mg total) by mouth daily. For acid reflux   09/03/2014 at Unknown time  . oxyCODONE-acetaminophen (PERCOCET) 10-325 MG per tablet Take 1 tablet by mouth every 6 (six) hours as needed. pain  0 unknown    Previous Psychotropic Medications: Yes   Substance Abuse History in the last 12 months:  No.  Consequences of Substance Abuse: NA  Results for orders placed or performed during the hospital encounter of 09/03/14 (from the past 72 hour(s))  Acetaminophen level     Status: Abnormal   Collection Time: 09/03/14  9:12 PM  Result Value Ref Range   Acetaminophen (Tylenol), Serum <10.0 (L) 10 - 30 ug/mL    Comment:        THERAPEUTIC CONCENTRATIONS VARY SIGNIFICANTLY. A RANGE OF 10-30 ug/mL MAY BE AN EFFECTIVE CONCENTRATION FOR MANY PATIENTS. HOWEVER, SOME ARE BEST TREATED AT CONCENTRATIONS OUTSIDE THIS RANGE. ACETAMINOPHEN CONCENTRATIONS >150 ug/mL AT 4 HOURS AFTER INGESTION AND >50 ug/mL AT 12 HOURS AFTER INGESTION ARE OFTEN ASSOCIATED WITH TOXIC REACTIONS.   CBC     Status: None   Collection Time: 09/03/14  9:12 PM  Result Value Ref Range   WBC 8.0 4.0 - 10.5 K/uL   RBC 4.59 4.22 - 5.81 MIL/uL   Hemoglobin 15.0 13.0 - 17.0 g/dL   HCT 42.5 39.0 - 52.0 %   MCV 92.6 78.0 -  100.0 fL   MCH 32.7 26.0 - 34.0 pg   MCHC 35.3 30.0 - 36.0 g/dL   RDW 12.9 11.5 -  15.5 %   Platelets 263 150 - 400 K/uL  Comprehensive metabolic panel     Status: Abnormal   Collection Time: 09/03/14  9:12 PM  Result Value Ref Range   Sodium 140 135 - 145 mmol/L    Comment: Please note change in reference range.   Potassium 3.4 (L) 3.5 - 5.1 mmol/L    Comment: Please note change in reference range.   Chloride 108 96 - 112 mEq/L   CO2 26 19 - 32 mmol/L   Glucose, Bld 111 (H) 70 - 99 mg/dL   BUN 8 6 - 23 mg/dL   Creatinine, Ser 0.87 0.50 - 1.35 mg/dL   Calcium 8.7 8.4 - 10.5 mg/dL   Total Protein 8.0 6.0 - 8.3 g/dL   Albumin 4.8 3.5 - 5.2 g/dL   AST 21 0 - 37 U/L   ALT 28 0 - 53 U/L   Alkaline Phosphatase 83 39 - 117 U/L   Total Bilirubin 0.5 0.3 - 1.2 mg/dL   GFR calc non Af Amer >90 >90 mL/min   GFR calc Af Amer >90 >90 mL/min    Comment: (NOTE) The eGFR has been calculated using the CKD EPI equation. This calculation has not been validated in all clinical situations. eGFR's persistently <90 mL/min signify possible Chronic Kidney Disease.    Anion gap 6 5 - 15  Ethanol (ETOH)     Status: Abnormal   Collection Time: 09/03/14  9:12 PM  Result Value Ref Range   Alcohol, Ethyl (B) 149 (H) 0 - 9 mg/dL    Comment:        LOWEST DETECTABLE LIMIT FOR SERUM ALCOHOL IS 11 mg/dL FOR MEDICAL PURPOSES ONLY   Salicylate level     Status: None   Collection Time: 09/03/14  9:12 PM  Result Value Ref Range   Salicylate Lvl <6.0 2.8 - 20.0 mg/dL  Valproic acid level     Status: Abnormal   Collection Time: 09/03/14  9:19 PM  Result Value Ref Range   Valproic Acid Lvl 26.6 (L) 50.0 - 100.0 ug/mL    Comment: Performed at [facility]  Urine Drug Screen     Status: Abnormal   Collection Time: 09/03/14  9:27 PM  Result Value Ref Range   Opiates NONE DETECTED NONE DETECTED   Cocaine NONE DETECTED NONE DETECTED   Benzodiazepines POSITIVE (A) NONE DETECTED   Amphetamines NONE  DETECTED NONE DETECTED   Tetrahydrocannabinol NONE DETECTED NONE DETECTED   Barbiturates NONE DETECTED NONE DETECTED    Comment:        DRUG SCREEN FOR MEDICAL PURPOSES ONLY.  IF CONFIRMATION IS NEEDED FOR ANY PURPOSE, NOTIFY LAB WITHIN 5 DAYS.        LOWEST DETECTABLE LIMITS FOR URINE DRUG SCREEN Drug Class       Cutoff (ng/mL) Amphetamine      1000 Barbiturate      200 Benzodiazepine   737 Tricyclics       106 Opiates          300 Cocaine          300 THC              50     Observation Level/Precautions:  15 minute checks  Laboratory:  CBC Chemistry Profile UDS UA  Psychotherapy:  Psychiatry   Medications:    Consultations:    Discharge Concerns:    Estimated LOS:  Other:     Psychological Evaluations: Yes   Treatment  Plan Summary: Daily contact with patient to assess and evaluate symptoms and progress in treatment and Medication management 1. Admit for crisis management and stabilization.  2. Medication management to reduce current symptoms to base line and improve the      patient's overall level of functioning:  3. Treat health problems as indicated.  4. Develop treatment plan to decrease risk of relapse upon discharge and the need for      readmission.  5. Psycho-social education regarding relapse prevention and self- care.  6. Health care follow up as needed for medical problems.  7. Restart home medications where appropriate.  Medical Decision Making:  Review of Psycho-Social Stressors (1), Review of Last Therapy Session (1), Independent Review of image, tracing or specimen (2) and Review of Medication Regimen & Side Effects (2)  I certify that inpatient services furnished can reasonably be expected to improve the patient's condition.    Rankin, [place], FNP-BC 1/20/20164:16 PM  I have reviewed patient case with NP and have met with patient. I agree with NP note, assessment and plan. Patient is a 44 year old man, who presents for depression , suicidal  ideations in the context of worsening stressors. States he has epilepsy and due to this lost his driver's license and job. Describes poor support network. Describes history of Bipolar Disorder and Tourettes Disorder. Presents sad and depressed,  Describes recent auditory hallucinations. At this time not internally preoccupied and currently denying plan or intention of hurting self on unit and able to contract for safety on unit at present.

## 2014-09-06 NOTE — [facility] Group Notes (Signed)
[facility] LCSW Group Therapy  09/06/2014 2:37 PM  Type of Therapy:  Group Therapy  Participation Level:  Did Not Attend-pt in bed/refusing to attend group at this time.   Summary of Progress/Problems: [facility] Speaker came to talk about his personal journey with substance abuse and addiction. The pt processed ways by which to relate to the speaker. MHA speaker provided handouts and educational information pertaining to groups and services offered by [facility].   Smart, Arleene Settle LCSWA  09/06/2014, 2:37 PM

## 2014-09-07 DIAGNOSIS — F329 Major depressive disorder, single episode, unspecified: Secondary | ICD-10-CM

## 2014-09-07 MED ORDER — TRAZODONE HCL 50 MG PO TABS
50.0000 mg | ORAL_TABLET | Freq: Every day | ORAL | Status: DC
  Administered 2014-09-07 – 2014-09-10 (×4): 50 mg via ORAL
  Filled 2014-09-07 (×7): qty 1

## 2014-09-07 NOTE — Progress Notes (Signed)
[facility] MD Progress Note  09/07/2014 7:09 PM Russell [name]  MRN:  [id number] Subjective:   Patient states he feels " about the same"  as when he was admitted. Reports depression, sadness, which he attributes to psycho-social stressors. Objective: I have discussed case with treatment team and have met with patient. Patient remains depressed, sad, constricted in affect, but denying any current suicidal plan or intention and contracting for safety on unit. He has had limited engagement in milieu. No disruptive  Behaviors on unit. Ruminative about stressors, to include marital discord, homelessness. Reports history of epilepsy, although has had no seizures in several months. Has been treated with Depakote. States he currently does not have a neurologist. Did seem somewhat future oriented as he expressed interest in being referred to a neurologist upon discharge so  He can get an updated EEG and possibly permission to drive again. Also states that he had a job interview he missed due to being admitted and is hoping staff/  SW can call and report he is in the hospital so he does not lose job and can interview when he is discharged. At this time denies medication side effects.  Principal Problem:  Depression  Diagnosis:   Patient Active Problem List   Diagnosis Date Noted  . Schizoaffective disorder, bipolar type [F25.0] 09/05/2014  . Opiate dependence [F11.20] 09/04/2014  . Substance induced mood disorder [F19.94] 04/25/2014    Class: Chronic  . Benzodiazepine dependence [F13.20] 04/21/2014  . Suicidal ideation [R45.851] 04/19/2014   Total Time spent with patient: 25 minutes    Past Medical History:  Past Medical History  Diagnosis Date  . Hernia   . Bipolar 1 disorder   . Epilepsy   . Schizophrenia     Past Surgical History  Procedure Laterality Date  . Ankle fracture surgery  1994 - approximate  . Ligament repair  1998    right arm   Family History: History reviewed. No  pertinent family history. Social History:  History  Alcohol Use  . Yes    Comment: socially     History  Drug Use No    History   Social History  . Marital Status: Single    Spouse Name: N/A    Number of Children: N/A  . Years of Education: N/A   Social History Main Topics  . Smoking status: Current Some Day Smoker    Types: Cigarettes  . Smokeless tobacco: Never Used  . Alcohol Use: Yes     Comment: socially  . Drug Use: No  . Sexual Activity: None   Other Topics Concern  . None   Social History Narrative   Additional History:    Sleep: Good  Appetite:  Fair   Assessment:   Musculoskeletal: Strength & Muscle Tone: within normal limits Gait & Station: normal Patient leans: N/A   Psychiatric Specialty Exam: Physical Exam  ROS  Blood pressure 116/99, pulse 79, temperature 97.9 F (36.6 C), temperature source Oral, resp. rate 20, height 6' 1"  (1.854 m), weight 210 lb (95.255 kg), SpO2 98 %.Body mass index is 27.71 kg/(m^2).  General Appearance: Fairly Groomed  Engineer, water::  Fair  Speech:  Normal Rate  Volume:  Normal  Mood:  Depressed  Affect:  Constricted  Thought Process:  Goal Directed and Linear  Orientation:  Full (Time, Place, and Person)  Thought Content:  Rumination and no hallucinations , no delusions  Suicidal Thoughts:  Yes.  without intent/plan  at this time denies any thoughts of  hurting self and  contracts for safety on unit     Homicidal Thoughts:  No  Memory: Recent and Remote grossly intact  Judgement:  Fair  Insight:  Fair  Psychomotor Activity:  Decreased  Concentration:  Fair  Recall:  Good  Fund of Knowledge:Good  Language: Good  Akathisia:  Negative  Handed:  Right  AIMS (if indicated):     Assets:  Desire for Improvement Resilience  ADL's:fair   Cognition: intact   Sleep:  Number of Hours: 6.75     Current Medications: Current Facility-Administered Medications  Medication Dose Route Frequency Provider Last Rate  Last Dose  . divalproex (DEPAKOTE) DR tablet 500 mg  500 mg Oral BID WC Delfin Gant, NP   500 mg at 09/07/14 1655  . guanFACINE (TENEX) tablet 2 mg  2 mg Oral BID Delfin Gant, NP   2 mg at 09/07/14 1655  . ibuprofen (ADVIL,MOTRIN) tablet 400 mg  400 mg Oral Daily PRN Delfin Gant, NP      . LORazepam (ATIVAN) tablet 1 mg  1 mg Oral Q6H PRN Delfin Gant, NP   1 mg at 09/06/14 2129  . meloxicam (MOBIC) tablet 7.5 mg  7.5 mg Oral Daily PRN Delfin Gant, NP      . pantoprazole (PROTONIX) EC tablet 40 mg  40 mg Oral Daily Delfin Gant, NP   40 mg at 09/07/14 0851  . sertraline (ZOLOFT) tablet 50 mg  50 mg Oral Daily Delfin Gant, NP   50 mg at 09/07/14 7628    Lab Results: No results found for this or any previous visit (from the past 48 hour(s)).  Physical Findings: AIMS: Facial and Oral Movements Muscles of Facial Expression: None, normal Lips and Perioral Area: None, normal Jaw: None, normal Tongue: None, normal,Extremity Movements Upper (arms, wrists, hands, fingers): None, normal Lower (legs, knees, ankles, toes): None, normal, Trunk Movements Neck, shoulders, hips: None, normal, Overall Severity Severity of abnormal movements (highest score from questions above): None, normal Incapacitation due to abnormal movements: None, normal Patient's awareness of abnormal movements (rate only patient's report): No Awareness, Dental Status Current problems with teeth and/or dentures?: No Does patient usually wear dentures?: No  CIWA:    COWS:      Assessment- at this time patient remains depressed, sad, with ruminations about psychosocial stressors, and passive ( but denies any active and contracts for safety) thoughts of death. He is tolerating Depakote and Zoloft trials well and at this time has no side effects.  Treatment Plan Summary: Daily contact with patient to assess and evaluate symptoms and progress in treatment, Medication management, Plan  Continue inpatient treatment and continue medications as below Depakote ER 500 mgrs BID Zoloft 50 mgrs QDAY   Medical Decision Making:  Established Problem, Stable/Improving (1), Review of Psycho-Social Stressors (1), Review of Last Therapy Session (1) and Review of Medication Regimen & Side Effects (2) Problem Points:  Established problem, stable/improving (1), Review of last therapy session (1) and Review of psycho-social stressors (1) Data Points:  Review of medication regiment & side effects (2)    Shadow Schedler 09/07/2014, 7:09 PM

## 2014-09-07 NOTE — Progress Notes (Signed)
D. Pt had been up and visible in milieu this evening, participating in various activities. Pt spoke briefly about why he was here and spoke of some on-going issues with anxiety today and spoke some of his medical issues and spoke of having some mild abdominal discomfort this evening but chose not to receive any medication for it.  A. Support and encouragement provided. R. Safety maintained, will continue to monitor.

## 2014-09-07 NOTE — Progress Notes (Signed)
Recreation Therapy Notes  Animal-Assisted Activity/Therapy (AAA/T) Program Checklist/Progress Notes Patient Eligibility Criteria Checklist & Daily Group note for Rec Tx Intervention  Date: 01.21.2016 Time: 2:45pm Location: [address] Dayroom   AAA/T Program Assumption of Risk Form signed by Patient/ or Parent Legal Guardian yes  Patient is free of allergies or sever asthma yes  Patient reports no fear of animals yes  Patient reports no history of cruelty to animals yes  Patient understands his/her participation is voluntary yes  Patient washes hands before animal contact yes  Patient washes hands after animal contact yes  Behavioral Response: Did not attend.   Hong Timm L Jayr Lupercio, LRT/CTRS  Mahi Zabriskie L 09/07/2014 5:06 PM 

## 2014-09-07 NOTE — Progress Notes (Signed)
D: client visible on the unit, reports "I didn't get no sleep last night, I mean I slept but I didn't rest" client reports depression as "8" of 10.  A: Writer introduced self to client , reviewed medications, [facility], received order for Trazodone 50 mg for sleep. Staff encouraged group.R:cleint is safe on the unit, did not attend group.

## 2014-09-07 NOTE — Progress Notes (Signed)
Patient ID: Russell SessionsChristopher [name], male   DOB: 1971/04/03, 44 y.o.   MRN: [id number] D-awakened for am med pass. States he is homeless and wife indicated she was not interested in reunification and he agrees. He has no plans for housing once discharged from here. He has a number of health problems as well as depression and substance abuse issues. He is pleasant but isolative and offers only what is asked of him.  A-Support offered Monitored for safety medications as ordered. R-Waiting to see Dr and Child psychotherapistsocial worker today to work on a plan, Has some interest in a rehab program. No complaints voiced.

## 2014-09-07 NOTE — [facility] Group Notes (Signed)
[facility] LCSW Group Therapy 07/19/2015  1:15 PM   Type of Therapy: Group Therapy  Participation Level: Did Not Attend. Patient invited to participate but declined.   Loyda Costin, MSW, LCSWA Clinical Social Worker [facility] [phone]   

## 2014-09-07 NOTE — Clinical Social Work Note (Signed)
CSW met with pt individually to check in. Pt asked that CSW attempt to get in touch with his public defender-Beth Toones to find out when his next court date is, his probation officer, Mali Williams to notify him of pt's hospitalization, and with Ms. Turner from [employer] ([employer] systems [employer]) to explain that he was hospitalized and missed job interview at [employer] and to see if this can be rescheduled. CSW contacted [phone], spoke with Beth's office-no updated court date yet-she recommended that pt call weekly to check for update. CSW contacted [facility]) who is working to get in touch with Ms.Turner and give her message. CSW attempted Probation office in [place] [phone] Mali Williams-was told this was not correct number; and [phone]-[place] probation office-number out of order. Pt does not have this number. CSW assessing. Pt have to contact wife/family member to get this number if he wants his PO notified of hospitalization. In terms of follow-up, pt plans to continue with [facility] and with [facility] for med management and therapy. He reports that his wife kicked him out and he is currently homeless. Pt not willing to discuss options at this time and stated that he has no family that can help him. Pt reports feeling hopeless but brightens upon approach and when interacting with staff.   [place], LCSWA 09/07/2014 11:59 AM

## 2014-09-07 NOTE — [facility] Group Notes (Signed)
[facility] Group Notes:  (Nursing/MHT/Case Management/Adjunct)  Date:  09/07/2014  Time:  9:34 AM  Type of Therapy:  Nurse Education  Participation Level:  Did Not Attend  Participation Quality:  N/A  Affect:  N/A  Cognitive:  N/A  Insight:  None  Engagement in Group:  N/A  Modes of Intervention:  Discussion, Education and Support  Summary of Progress/Problems:  Wynona LunaBeck, Kohler Pellerito K 09/07/2014, 9:34 AM

## 2014-09-08 MED ORDER — GUANFACINE HCL 1 MG PO TABS
1.0000 mg | ORAL_TABLET | Freq: Two times a day (BID) | ORAL | Status: DC
  Administered 2014-09-08 – 2014-09-13 (×9): 1 mg via ORAL
  Filled 2014-09-08 (×2): qty 1
  Filled 2014-09-08: qty 14
  Filled 2014-09-08 (×6): qty 1
  Filled 2014-09-08: qty 14
  Filled 2014-09-08 (×4): qty 1

## 2014-09-08 NOTE — Plan of Care (Signed)
Problem: Alteration in mood & ability to function due to Goal: LTG-Pt reports reduction in suicidal thoughts (Patient reports reduction in suicidal thoughts and is able to verbalize a safety plan for whenever patient is feeling suicidal)  Outcome: Progressing Patient denies having any suicidal thoughts today. Pt encouraged to come to RN if thoughts occur. Goal: STG-Patient will attend groups Outcome: Progressing Patient is attending some unit groups today. Goal: STG-Patient will comply with prescribed medication regimen (Patient will comply with prescribed medication regimen)  Outcome: Progressing Patient has adhered with medication regimen today with ease.

## 2014-09-08 NOTE — Progress Notes (Signed)
D: Client is more visible on the unit, interacting with peers, playing cards, and watching TV in dayroom. Client reports days has been better rates depression decreased at "5" of 10. Client also reports he went down for meals today. Client smiling, affect brighter. A: Writer provided emotional support, encouraged groups. Staff will monitor q8515min for safety. R: client is safe on the unit, attended group.

## 2014-09-08 NOTE — Progress Notes (Signed)
D: Patient is alert and oriented. Pt's mood and affect is "depressed" and blunted. Pt's eye contact is fair. Pt states "I'm just really depressed, I wish I felt better." Pt's BP low this morning (See docflowsheet-vitals). When RN asked if pt has had any suicidal thoughts and/or AVH this morning pt states "no, not yet." Pt denies HI. Pt remains in bed resting the majority of the morning. Pt becomes active in the milieu this afternoon and is observed playing cards, interacting, and smiling at times. A: Pt encouraged to come to RN if suicidal thoughts or AVH occur throughout the day. Encouragement/Support provided to pt. 0800 dose of Quanfacine held d/t BP concerns, per MD Eappen, pt made aware, medication education reviewed with pt, will reassess BP frequently, fluids given and pt encouraged to increase fluids throughout the day, pt encouraged to get out of bed. MD, Cobos made aware of pt's BP concerns as well during treatment team meeting. Scheduled medications administered per providers orders (See MAR). 15 minute checks continued per protocol for patient safety.  R: Patient cooperative and receptive to nursing interventions. Pt remains safe.

## 2014-09-08 NOTE — Progress Notes (Addendum)
Patient ID: Russell [name], male   DOB: [birth year], 44 y.o.   MRN: [id number] [facility] MD Progress Note  09/08/2014 10:53 AM Russell [name]  MRN:  [id number] Subjective:   Patient reports ongoing depression. He states he does feel " a little more positive than I did when I was admitted", which he attributes to being in hospital and getting staff /peer support. Objective: I have discussed case with treatment team and have met with patient. Remains depressed, constricted in affect, tends to be isolative in room. Denies any suicidal ideations at this time and contracts for safety on unit. No medication side effects reported. Nursing staff did express concern that patient's BP was low ( at this time 101/63, no tachycardia). Patient reports it could be related to guanfacine, which he takes for Tourette's Disorder. ( Of note, has no noticeable symptoms or manifestations of Tourette's at this time- no tics, no verbalizations noted). Has had no seizure activity on unit. At this time more future oriented, focusing on " getting out of this negative vicious cycle". He is hoping his Significant Other will " let me come  Back", and to get an outpatient neurologist who can " see me and maybe clear me to drive again". Of note, patient's ETOH level has been elevated upon admission- we discussed this. At this time patient seems to minimize history of substance or alcohol abuse- states " I just had a couple of beers that day, I was not drunk" " I don't have a problem with alcohol". Responds well to support and empathy. Affect is more reactive today.  Principal Problem:  Depression  Diagnosis:   Patient Active Problem List   Diagnosis Date Noted  . Schizoaffective disorder, bipolar type [F25.0] 09/05/2014  . Opiate dependence [F11.20] 09/04/2014  . Substance induced mood disorder [F19.94] 04/25/2014    Class: Chronic  . Benzodiazepine dependence [F13.20] 04/21/2014  . Suicidal ideation [R45.851] 04/19/2014    Total Time spent with patient: 25 minutes    Past Medical History:  Past Medical History  Diagnosis Date  . Hernia   . Bipolar 1 disorder   . Epilepsy   . Schizophrenia     Past Surgical History  Procedure Laterality Date  . Ankle fracture surgery  1994 - approximate  . Ligament repair  1998    right arm   Family History: History reviewed. No pertinent family history. Social History:  History  Alcohol Use  . Yes    Comment: socially     History  Drug Use No    History   Social History  . Marital Status: Single    Spouse Name: N/A    Number of Children: N/A  . Years of Education: N/A   Social History Main Topics  . Smoking status: Current Some Day Smoker    Types: Cigarettes  . Smokeless tobacco: Never Used  . Alcohol Use: Yes     Comment: socially  . Drug Use: No  . Sexual Activity: None   Other Topics Concern  . None   Social History Narrative   Additional History:    Sleep: Good  Appetite:  Fair   Assessment:   Musculoskeletal: Strength & Muscle Tone: within normal limits Gait & Station: normal Patient leans: N/A   Psychiatric Specialty Exam: Physical Exam  ROS  Blood pressure 101/63, pulse 79, temperature 97.9 F (36.6 C), temperature source Oral, resp. rate 20, height _0  (1.854 m), weight 210 lb (95.255 kg), SpO2 98 %.Body mass index is 27.[name]  kg/(m^2).  General Appearance: Fairly Groomed  Engineer, water::  Fair  Speech:  Normal Rate  Volume:  Normal  Mood:  Depressed  Affect:  Constricted, but affect somewhat more reactive today than yesterday  Thought Process:  Goal Directed and Linear  Orientation:  Full (Time, Place, and Person)  Thought Content:  Rumination and no hallucinations , no delusions  Suicidal Thoughts:  No  at this time denies any thoughts of hurting self and  contracts for safety on unit     Homicidal Thoughts:  No  Memory: Recent and Remote grossly intact  Judgement:  Fair  Insight:  Fair  Psychomotor  Activity:  Decreased  Concentration:  Fair  Recall:  Good  Fund of Knowledge:Good  Language: Good  Akathisia:  Negative  Handed:  Right  AIMS (if indicated):     Assets:  Desire for Improvement Resilience  ADL's:fair   Cognition: intact   Sleep:  Number of Hours: 6.5     Current Medications: Current Facility-Administered Medications  Medication Dose Route Frequency Provider Last Rate Last Dose  . divalproex (DEPAKOTE) DR tablet 500 mg  500 mg Oral BID WC Delfin Gant, NP   500 mg at 09/08/14 0740  . guanFACINE (TENEX) tablet 2 mg  2 mg Oral BID Delfin Gant, NP   Stopped at 09/08/14 0825  . ibuprofen (ADVIL,MOTRIN) tablet 400 mg  400 mg Oral Daily PRN Delfin Gant, NP      . LORazepam (ATIVAN) tablet 1 mg  1 mg Oral Q6H PRN Delfin Gant, NP   1 mg at 09/06/14 2129  . meloxicam (MOBIC) tablet 7.5 mg  7.5 mg Oral Daily PRN Delfin Gant, NP      . pantoprazole (PROTONIX) EC tablet 40 mg  40 mg Oral Daily Delfin Gant, NP   40 mg at 09/08/14 0741  . sertraline (ZOLOFT) tablet 50 mg  50 mg Oral Daily Delfin Gant, NP   50 mg at 09/08/14 0741  . traZODone (DESYREL) tablet 50 mg  50 mg Oral QHS Nena Polio, PA-C   50 mg at 09/07/14 2134    Lab Results: No results found for this or any previous visit (from the past 48 hour(s)).  Physical Findings: AIMS: Facial and Oral Movements Muscles of Facial Expression: None, normal Lips and Perioral Area: None, normal Jaw: None, normal Tongue: None, normal,Extremity Movements Upper (arms, wrists, hands, fingers): None, normal Lower (legs, knees, ankles, toes): None, normal, Trunk Movements Neck, shoulders, hips: None, normal, Overall Severity Severity of abnormal movements (highest score from questions above): None, normal Incapacitation due to abnormal movements: None, normal Patient's awareness of abnormal movements (rate only patient's report): No Awareness, Dental Status Current problems with  teeth and/or dentures?: No Does patient usually wear dentures?: No  CIWA:    COWS:      Assessment- Remains depressed, sad, constricted in affect, but some improvement noted compared to admission. He is not suicidal . Seems more future oriented.  Tolerating medications well thus far and has had no seizures on unit. Concern about Guanfacine contributing to low blood pressure.  Minimizes alcohol abuse issues   Treatment Plan Summary: Daily contact with patient to assess and evaluate symptoms and progress in treatment, Medication management, Plan Continue inpatient treatment and continue medications as below Depakote ER 500 mgrs BID Zoloft 50 mgrs QDAY  Decrease Guanfacine ( Tenex) to 1 mgr BID   Medical Decision Making:  Established Problem, Stable/Improving (1), Review of Psycho-Social  Stressors (1), Review of Last Therapy Session (1) and Review of Medication Regimen & Side Effects (2) Problem Points:  Established problem, stable/improving (1), Review of last therapy session (1) and Review of psycho-social stressors (1) Data Points:  Review of medication regiment & side effects (2) Review of new medications or change in dosage (2)    COBOS, FERNANDO 09/08/2014, 10:53 AM

## 2014-09-09 DIAGNOSIS — F339 Major depressive disorder, recurrent, unspecified: Secondary | ICD-10-CM

## 2014-09-09 LAB — VALPROIC ACID LEVEL: VALPROIC ACID LVL: 84.9 ug/mL (ref 50.0–100.0)

## 2014-09-09 LAB — GLUCOSE, CAPILLARY: Glucose-Capillary: 98 mg/dL (ref 70–99)

## 2014-09-09 MED ORDER — SERTRALINE HCL 25 MG PO TABS
75.0000 mg | ORAL_TABLET | Freq: Every day | ORAL | Status: DC
  Administered 2014-09-10 – 2014-09-11 (×2): 75 mg via ORAL
  Filled 2014-09-09 (×5): qty 3

## 2014-09-09 NOTE — [facility] Group Notes (Signed)
[facility] LCSW Group Therapy Note  09/09/2014 / 11:15 AM  Type of Therapy and Topic:  Group Therapy: Avoiding Self-Sabotaging and Enabling Behaviors  Participation Level:  Did Not Attend. Patient choose not to attend although he was informed of group time and location by MHT shortly before group began.    Carney Bernatherine C Edelyn Heidel, LCSW

## 2014-09-09 NOTE — Progress Notes (Addendum)
D)  Has been pleasant and cooperative this evening, interacting appropriately with staff and peers.  Was on the 400 hall playing cards for part of the evening and watching tv.  Attended group on the 400 hall and stayed there for most of the evening.  Has voiced no c/o's, and has been compliant with meds and program.  Went to bed and stated feeling somewhat anxious tonight, requested and was given hs trazodone and ativan.  A)  Will continue to monitor for safety, continue POC R)  Safety maintained.

## 2014-09-09 NOTE — Progress Notes (Signed)
Patient ID: Russell SessionsChristopher Raynor, male   DOB: 1970-12-26, 44 y.o.   MRN: [id number]   D: Pt has been very flat and depressed on the unit today, he was in the bed most of the morning he did not attend any groups nor did he engage in treatment. Pt reported that his depression was a 7, and that his hopelessness was a 7. Pt reported being negative HI, no AH/VH noted. Pt did report being positive for SI, however was able to contract for safety. A: 15 min checks continued for patient safety. R: Pt safety maintained.

## 2014-09-09 NOTE — Progress Notes (Addendum)
[facility]  09/09/2014 12:56 PM Russell SessionsChristopher [name]  MRN:  [id number]   Subjective:   I still have depression.  I'm sleeping on and off.  Objective: Patient seen and chart reviewed.  Patient remains depressed sad and easily tearful.  He is taking his medication and reported no side effects.  He has a lot of guilty and rumination about his past.  Though he denies any suicidal thoughts but remains very hopeless and worthless.  Patient is homeless and ruminate about his past that how he lost his job, home and family because of having seizures and then using alcohol.  Patient is going to the groups and sometime he is isolated and withdrawn.  His last seizure was a few months ago.  He is taking Depakote for that.  His last Depakote was 6 dose ago and it was 26.6.  He has no tremors or shakes.    Principal Problem: Major depressive disorder, recurrent  Diagnosis:   Patient Active Problem List   Diagnosis Date Noted  . Schizoaffective disorder, bipolar type [F25.0] 09/05/2014  . Opiate dependence [F11.20] 09/04/2014  . Substance induced mood disorder [F19.94] 04/25/2014    Class: Chronic  . Benzodiazepine dependence [F13.20] 04/21/2014  . Suicidal ideation [R45.851] 04/19/2014   Total Time spent with patient: 25 minutes    Past Medical History:  Past Medical History  Diagnosis Date  . Hernia   . Bipolar 1 disorder   . Epilepsy   . Schizophrenia     Past Surgical History  Procedure Laterality Date  . Ankle fracture surgery  1994 - approximate  . Ligament repair  1998    right arm   Family History: History reviewed. No pertinent family history. Social History:  History  Alcohol Use  . Yes    Comment: socially     History  Drug Use No    History   Social History  . Marital Status: Single    Spouse Name: N/A    Number of Children: N/A  . Years of Education: N/A   Social History Main Topics  . Smoking status: Current Some Day Smoker    Types: Cigarettes  .  Smokeless tobacco: Never Used  . Alcohol Use: Yes     Comment: socially  . Drug Use: No  . Sexual Activity: None   Other Topics Concern  . None   Social History Narrative   Additional History:    Sleep: Good  Appetite:  Fair   Assessment:   Musculoskeletal: Strength & Muscle Tone: within normal limits Gait & Station: normal Patient leans: N/A   Psychiatric Specialty Exam: Physical Exam  Review of Systems  Psychiatric/Behavioral: Positive for depression. The patient is nervous/anxious and has insomnia.     Blood pressure 127/84, pulse 55, temperature 97.4 F (36.3 C), temperature source Oral, resp. rate 18, height 6\' 1"  (1.854 m), weight 95.255 kg (210 lb), SpO2 98 %.Body mass index is 27.71 kg/(m^2).  General Appearance: Fairly Groomed  Patent attorneyye Contact::  Fair  Speech:  Normal Rate  Volume:  Normal  Mood:  Depressed  Affect:  Constricted,   Thought Process:  Goal Directed and Linear  Orientation:  Full (Time, Place, and Person)  Thought Content:  Rumination and no hallucinations , no delusions  Suicidal Thoughts:  No       Homicidal Thoughts:  No  Memory: Recent and Remote grossly intact  Judgement:  Fair  Insight:  Fair  Psychomotor Activity:  Decreased  Concentration:  Fair  Recall:  [place] Major of Knowledge:Good  Language: Good  Akathisia:  Negative  Handed:  Right  AIMS (if indicated):     Assets:  Desire for Improvement Resilience  ADL's:fair   Cognition: intact   Sleep:  Number of Hours: 6.5     Current Medications: Current Facility-Administered Medications  Medication Dose Route Frequency Provider Last Rate Last Dose  . divalproex (DEPAKOTE) DR tablet 500 mg  500 mg Oral BID WC Earney Navy, NP   500 mg at 09/09/14 1115  . guanFACINE (TENEX) tablet 1 mg  1 mg Oral BID Craige Cotta, MD   1 mg at 09/09/14 1115  . ibuprofen (ADVIL,MOTRIN) tablet 400 mg  400 mg Oral Daily PRN Earney Navy, NP      . LORazepam (ATIVAN) tablet 1 mg  1  mg Oral Q6H PRN Earney Navy, NP   1 mg at 09/06/14 2129  . meloxicam (MOBIC) tablet 7.5 mg  7.5 mg Oral Daily PRN Earney Navy, NP      . pantoprazole (PROTONIX) EC tablet 40 mg  40 mg Oral Daily Earney Navy, NP   40 mg at 09/09/14 1115  . sertraline (ZOLOFT) tablet 50 mg  50 mg Oral Daily Earney Navy, NP   50 mg at 09/09/14 1115  . traZODone (DESYREL) tablet 50 mg  50 mg Oral QHS Verne Spurr, PA-C   50 mg at 09/08/14 2143    Lab Results: No results found for this or any previous visit (from the past 48 hour(s)).  Physical Findings: AIMS: Facial and Oral Movements Muscles of Facial Expression: None, normal Lips and Perioral Area: None, normal Jaw: None, normal Tongue: None, normal,Extremity Movements Upper (arms, wrists, hands, fingers): None, normal Lower (legs, knees, ankles, toes): None, normal, Trunk Movements Neck, shoulders, hips: None, normal, Overall Severity Severity of abnormal movements (highest score from questions above): None, normal Incapacitation due to abnormal movements: None, normal Patient's awareness of abnormal movements (rate only patient's report): No Awareness, Dental Status Current problems with teeth and/or dentures?: No Does patient usually wear dentures?: No  CIWA:    COWS:      Assessment-  Patient is 44 year old homeless Caucasian man who was admitted because of severe depression .  He continues to have symptoms of residual depression.  However he is tolerating his medication and denies any side effects.   Treatment Plan Summary: Daily contact with patient to assess and evaluate symptoms and progress in treatment, Medication management, Plan Continue inpatient treatment and continue medications as below Depakote ER 500 mgrs BID, we will get Depakote level since last level was low.  Increase Zoloft 75 mg daily.  Continue Guanfacine to 1 mgr BID   Medical Decision Making:  Established Problem, Stable/Improving (1), Review of  Psycho-Social Stressors (1), Review of Last Therapy Session (1) and Review of Medication Regimen & Side Effects (2) Problem Points:  Established problem, stable/improving (1), Review of last therapy session (1) and Review of psycho-social stressors (1) Data Points:  Review of medication regiment & side effects (2) Review of new medications or change in dosage (2)    Rodert Hinch T. 09/09/2014, 12:56 PM

## 2014-09-09 NOTE — Progress Notes (Signed)
Adult Psychoeducational Group Note  Date:  09/09/2014 Time:  9:08 PM  Group Topic/Focus:  Wrap-Up Group:   The focus of this group is to help patients review their daily goal of treatment and discuss progress on daily workbooks.  Participation Level:  Active  Participation Quality:  Appropriate  Affect:  Appropriate  Cognitive:  Appropriate  Insight: Appropriate  Engagement in Group:  Engaged  Modes of Intervention:  Discussion  Additional Comments:The patient expressed that he had a good day.The patient also said that socializing and a up beat day made his day good.  Octavio Mannshigpen, Jury Caserta Lee 09/09/2014, 9:08 PM

## 2014-09-09 NOTE — Progress Notes (Signed)
[facility] Group Notes:  (Nursing/MHT/Case Management/Adjunct)  Date:  09/09/2014  Time:  12:10 AM  Type of Therapy:  Psychoeducational Skills  Participation Level:  Minimal  Participation Quality:  Inattentive and Resistant  Affect:  Appropriate  Cognitive:  Lacking  Insight:  None  Engagement in Group:  Resistant  Modes of Intervention:  Education  Summary of Progress/Problems: Patient states that he didn't accomplish anything today since he slept quite a bit. He was unable to state a coping skill (theme of the day).   Keylin Podolsky S 09/09/2014, 12:10 AM

## 2014-09-10 NOTE — Progress Notes (Signed)
[facility]  09/10/2014 12:18 PM Russell [name]  MRN:  [id number]   Subjective:  I am sleeping better.   Objective: Patient seen and chart reviewed.  Patient is feeling better from yesterday.  He's been going to groups .  He still has moment of depression and anxiety but he is coping better.  He continues to have a lot of guilty, negative thinking and rumination about his past.  His taking his medication as prescribed.  His Depakote level was 84.9 which was drawn last night.  Patient denies any shakes or tremors.    Principal Problem: Major depressive disorder, recurrent  Diagnosis:   Patient Active Problem List   Diagnosis Date Noted  . MDD (major depressive disorder), recurrent episode, severe [F33.2] 09/09/2014  . Schizoaffective disorder, bipolar type [F25.0] 09/05/2014  . Opiate dependence [F11.20] 09/04/2014  . Substance induced mood disorder [F19.94] 04/25/2014    Class: Chronic  . Benzodiazepine dependence [F13.20] 04/21/2014  . Suicidal ideation [R45.851] 04/19/2014   Total Time spent with patient: 25 minutes    Past Medical History:  Past Medical History  Diagnosis Date  . Hernia   . Bipolar 1 disorder   . Epilepsy   . Schizophrenia     Past Surgical History  Procedure Laterality Date  . Ankle fracture surgery  1994 - approximate  . Ligament repair  1998    right arm   Family History: History reviewed. No pertinent family history. Social History:  History  Alcohol Use  . Yes    Comment: socially     History  Drug Use No    History   Social History  . Marital Status: Single    Spouse Name: N/A    Number of Children: N/A  . Years of Education: N/A   Social History Main Topics  . Smoking status: Current Some Day Smoker    Types: Cigarettes  . Smokeless tobacco: Never Used  . Alcohol Use: Yes     Comment: socially  . Drug Use: No  . Sexual Activity: None   Other Topics Concern  . None   Social History Narrative   Additional  History:    Sleep: Good  Appetite:  Fair   Assessment:   Musculoskeletal: Strength & Muscle Tone: within normal limits Gait & Station: normal Patient leans: N/A   Psychiatric Specialty Exam: Physical Exam  ROS  Blood pressure 119/88, pulse 75, temperature 97.7 F (36.5 C), temperature source Oral, resp. rate 20, height 6\' 1"  (1.854 m), weight 95.255 kg (210 lb), SpO2 98 %.Body mass index is 27.71 kg/(m^2).  General Appearance: Fairly Groomed  Patent attorneyye Contact::  Fair  Speech:  Normal Rate  Volume:  Normal  Mood:  Depressed  Affect:  Constricted,   Thought Process:  Goal Directed and Linear  Orientation:  Full (Time, Place, and Person)  Thought Content:  Rumination  Suicidal Thoughts:  No       Homicidal Thoughts:  No  Memory: Recent and Remote grossly intact  Judgement:  Fair  Insight:  Fair  Psychomotor Activity:  Decreased  Concentration:  Fair  Recall:  Good  Fund of Knowledge:Good  Language: Good  Akathisia:  Negative  Handed:  Right  AIMS (if indicated):     Assets:  Desire for Improvement Resilience  ADL's:fair   Cognition: intact   Sleep:  Number of Hours: 4     Current Medications: Current Facility-Administered Medications  Medication Dose Route Frequency Provider Last Rate Last Dose  .  divalproex (DEPAKOTE) DR tablet 500 mg  500 mg Oral BID WC Earney Navy, NP   500 mg at 09/10/14 0952  . guanFACINE (TENEX) tablet 1 mg  1 mg Oral BID Craige Cotta, MD   1 mg at 09/10/14 4098  . ibuprofen (ADVIL,MOTRIN) tablet 400 mg  400 mg Oral Daily PRN Earney Navy, NP      . LORazepam (ATIVAN) tablet 1 mg  1 mg Oral Q6H PRN Earney Navy, NP   1 mg at 09/09/14 2321  . meloxicam (MOBIC) tablet 7.5 mg  7.5 mg Oral Daily PRN Earney Navy, NP      . pantoprazole (PROTONIX) EC tablet 40 mg  40 mg Oral Daily Earney Navy, NP   40 mg at 09/10/14 0952  . sertraline (ZOLOFT) tablet 75 mg  75 mg Oral Daily Cleotis Nipper, MD   75 mg at 09/10/14  1191  . traZODone (DESYREL) tablet 50 mg  50 mg Oral QHS Verne Spurr, PA-C   50 mg at 09/09/14 2320    Lab Results:  Results for orders placed or performed during the hospital encounter of 09/05/14 (from the past 48 hour(s))  Glucose, capillary     Status: None   Collection Time: 09/09/14  5:22 PM  Result Value Ref Range   Glucose-Capillary 98 70 - 99 mg/dL  Valproic acid level     Status: None   Collection Time: 09/09/14  7:40 PM  Result Value Ref Range   Valproic Acid Lvl 84.9 50.0 - 100.0 ug/mL    Comment: Performed at [facility]    Physical Findings: AIMS: Facial and Oral Movements Muscles of Facial Expression: None, normal Lips and Perioral Area: None, normal Jaw: None, normal Tongue: None, normal,Extremity Movements Upper (arms, wrists, hands, fingers): None, normal Lower (legs, knees, ankles, toes): None, normal, Trunk Movements Neck, shoulders, hips: None, normal, Overall Severity Severity of abnormal movements (highest score from questions above): None, normal Incapacitation due to abnormal movements: None, normal Patient's awareness of abnormal movements (rate only patient's report): No Awareness, Dental Status Current problems with teeth and/or dentures?: No Does patient usually wear dentures?: No  CIWA:    COWS:      Assessment-  Patient is 44 year old homeless Caucasian man who was admitted because of severe depression .  He continues to have symptoms of residual depression.  However he is tolerating his medication and denies any side effects.   Treatment Plan Summary: Daily contact with patient to assess and evaluate symptoms and progress in treatment, Medication management, Plan Continue inpatient treatment and continue medications as below Depakote ER 500 mgrs BID, Depakote level 84.9.  Yesterday his Zoloft was increased and he is tolerating well.  Continue Zoloft 75 mg daily.  Continue Guanfacine to 1 mgr BID   Medical Decision Making:   Established Problem, Stable/Improving (1), Review of Psycho-Social Stressors (1), Review of Last Therapy Session (1) and Review of Medication Regimen & Side Effects (2) Problem Points:  Established problem, stable/improving (1), Review of last therapy session (1) and Review of psycho-social stressors (1) Data Points:  Review of medication regiment & side effects (2) Review of new medications or change in dosage (2)    Deberah Adolf T. 09/10/2014, 12:18 PM

## 2014-09-10 NOTE — Progress Notes (Signed)
Patient ID: Russell SessionsChristopher [name], male   DOB: [birth year], 44 y.o.   MRN: [id number]   D: Pt continues to be very flat and depressed on the unit. Pt was in the bed most of the morning, he did not attend groups nor did he engage in treatment. Pt reported that his depression was a 7, and that his helplessness was a 7. Pt reported being negative HI, no AH/VH noted. Pt reported that he continues to be positive SI, however is able to contract for safety. A: 15 min checks continued for patient safety. R: Pt safety maintained.

## 2014-09-10 NOTE — [facility] Group Notes (Signed)
[facility]  09/10/2014 1:15 PM   Type of Therapy:  Group Therapy  Participation Level:  Did Not Attend  Reyes IvanChelsea Horton, LCSW 09/10/2014 3:20 PM

## 2014-09-10 NOTE — Progress Notes (Signed)
Attended group 

## 2014-09-11 MED ORDER — SERTRALINE HCL 100 MG PO TABS
100.0000 mg | ORAL_TABLET | Freq: Every day | ORAL | Status: DC
  Administered 2014-09-12 – 2014-09-13 (×2): 100 mg via ORAL
  Filled 2014-09-11 (×3): qty 1
  Filled 2014-09-11: qty 7

## 2014-09-11 MED ORDER — TRAZODONE HCL 100 MG PO TABS
100.0000 mg | ORAL_TABLET | Freq: Every day | ORAL | Status: DC
  Administered 2014-09-11 – 2014-09-12 (×2): 100 mg via ORAL
  Filled 2014-09-11: qty 1
  Filled 2014-09-11: qty 7
  Filled 2014-09-11 (×2): qty 1

## 2014-09-11 NOTE — Progress Notes (Addendum)
Patient ID: Russell [name], male   DOB: 06/29/1971, 44 y.o.   MRN: [id number] [facility] MD Progress Note  09/11/2014 12:00 PM Russell [name]  MRN:  [id number]   Subjective:   Patient states he feels better. He states he feels more like his " normal self". Still ruminative about marital tension, states he is hoping that his wife ( common law) will " let me come back home ".    Objective:  Patient case reviewed and discussed in treatment team. Patient has remained calm , behavior on unit in good control. At this time reports significant improvement compared to admission, although still describes symptoms of some depression and anxiety, mostly revolving around marital /family issues. States he still tends to ruminate about " all the things I need to do to straighten my life out, and even though I am feeling a little better, it still tends to bum me out". Describes a desire to get his driver's license reinstated , states " I would have to see a Neurologist, get an EEG, then go to [facility], so it is a lot of hurdles to overcome". ( [place] seizure was almost a year ago)  Responds well to support, encouragement, empathy. Denies any medication side effects. Sense of self esteem is improved, appetite improved, sleep is fair, but improving. No suicide thoughts .  No disruptive behaviors on unit. Attending groups. Interacting appropriately with peers.  Valproic Acid Serum Level 84.9    Principal Problem: Major depressive disorder, recurrent  Diagnosis:   Patient Active Problem List   Diagnosis Date Noted  . MDD (major depressive disorder), recurrent episode, severe [F33.2] 09/09/2014  . Schizoaffective disorder, bipolar type [F25.0] 09/05/2014  . Opiate dependence [F11.20] 09/04/2014  . Substance induced mood disorder [F19.94] 04/25/2014    Class: Chronic  . Benzodiazepine dependence [F13.20] 04/21/2014  . Suicidal ideation [R45.851] 04/19/2014   Total Time spent with patient: 25 minutes    Past  Medical History:  Past Medical History  Diagnosis Date  . Hernia   . Bipolar 1 disorder   . Epilepsy   . Schizophrenia     Past Surgical History  Procedure Laterality Date  . Ankle fracture surgery  1994 - approximate  . Ligament repair  1998    right arm   Family History: History reviewed. No pertinent family history. Social History:  History  Alcohol Use  . Yes    Comment: socially     History  Drug Use No    History   Social History  . Marital Status: Single    Spouse Name: N/A    Number of Children: N/A  . Years of Education: N/A   Social History Main Topics  . Smoking status: Current Some Day Smoker    Types: Cigarettes  . Smokeless tobacco: Never Used  . Alcohol Use: Yes     Comment: socially  . Drug Use: No  . Sexual Activity: None   Other Topics Concern  . None   Social History Narrative   Additional History:    Sleep: fair  Appetite:  Fair   Assessment:   Musculoskeletal: Strength & Muscle Tone: within normal limits Gait & Station: normal Patient leans: N/A   Psychiatric Specialty Exam: Physical Exam  ROS  Blood pressure 113/63, pulse 67, temperature 97.7 F (36.5 C), temperature source Oral, resp. rate 20, height  (1.854 m), weight 210 lb (95.255 kg), SpO2 98 %.Body mass index is 27.71 kg/(m^2).  General Appearance: improved grooming  Eye Contact::  Good  Speech:  Normal Rate  Volume:  Normal  Mood:  less depressed, but still tends to feel sad, affect more reactive, less constricted   Affect:  improved, less constricted,   Thought Process:  Goal Directed and Linear  Orientation:  Full (Time, Place, and Person)  Thought Content:  Rumination and no hallucinations , no delusions  Suicidal Thoughts:  No   - at this time denies any plan or intention of hurting self or anyone else     Homicidal Thoughts:  No  Memory: Recent and Remote grossly intact  Judgement:  Other:  improving  Insight:  Fair  Psychomotor Activity:  Normal   Concentration:  Fair  Recall:  Good  Fund of Knowledge:Good  Language: Good  Akathisia:  Negative  Handed:  Right  AIMS (if indicated):     Assets:  Desire for Improvement Resilience  ADL's:fair   Cognition: intact   Sleep:  Number of Hours: 4.5     Current Medications: Current Facility-Administered Medications  Medication Dose Route Frequency Provider Last Rate Last Dose  . divalproex (DEPAKOTE) DR tablet 500 mg  500 mg Oral BID WC Earney NavyJosephine C Onuoha, NP   500 mg at 09/11/14 0826  . guanFACINE (TENEX) tablet 1 mg  1 mg Oral BID Craige CottaFernando A Evey Mcmahan, MD   1 mg at 09/11/14 [id number]  . ibuprofen (ADVIL,MOTRIN) tablet 400 mg  400 mg Oral Daily PRN Earney NavyJosephine C Onuoha, NP      . LORazepam (ATIVAN) tablet 1 mg  1 mg Oral Q6H PRN Earney NavyJosephine C Onuoha, NP   1 mg at 09/10/14 2227  . meloxicam (MOBIC) tablet 7.5 mg  7.5 mg Oral Daily PRN Earney NavyJosephine C Onuoha, NP      . pantoprazole (PROTONIX) EC tablet 40 mg  40 mg Oral Daily Earney NavyJosephine C Onuoha, NP   40 mg at 09/11/14 0826  . sertraline (ZOLOFT) tablet 75 mg  75 mg Oral Daily Cleotis NipperSyed T Arfeen, MD   75 mg at 09/11/14 0827  . traZODone (DESYREL) tablet 50 mg  50 mg Oral QHS Verne SpurrNeil Mashburn, PA-C   50 mg at 09/10/14 2227    Lab Results:  Results for orders placed or performed during the hospital encounter of 09/05/14 (from the past 48 hour(s))  Glucose, capillary     Status: None   Collection Time: 09/09/14  5:22 PM  Result Value Ref Range   Glucose-Capillary 98 70 - 99 mg/dL  Valproic acid level     Status: None   Collection Time: 09/09/14  7:40 PM  Result Value Ref Range   Valproic Acid Lvl 84.9 50.0 - 100.0 ug/mL    Comment: [facility]    Physical Findings: AIMS: Facial and Oral Movements Muscles of Facial Expression: None, normal Lips and Perioral Area: None, normal Jaw: None, normal Tongue: None, normal,Extremity Movements Upper (arms, wrists, hands, fingers): None, normal Lower (legs, knees, ankles, toes): None,  normal, Trunk Movements Neck, shoulders, hips: None, normal, Overall Severity Severity of abnormal movements (highest score from questions above): None, normal Incapacitation due to abnormal movements: None, normal Patient's awareness of abnormal movements (rate only patient's report): No Awareness, Dental Status Current problems with teeth and/or dentures?: No Does patient usually wear dentures?: No  CIWA:    COWS:      Assessment-  At this time patient is partially but significantly improved, compared to admission. Mood and affect improved. Tolerating medications well. Behavior in good control. Still ruminative about stressors. No  seizures noted on unit. Sleep still fair.   Treatment Plan Summary: Daily contact with patient to assess and evaluate symptoms and progress in treatment, Medication management, Plan Continue inpatient treatment and continue medications as below Depakote ER 500 mgrs BID Increase Zoloft  To 100  mg daily.   Continue Guanfacine to 1 mgr BID Increase Trazodone to 100 mgrs QHS PRN Insomnia.   Medical Decision Making:  Established Problem, Stable/Improving (1), Review of Psycho-Social Stressors (1), Review of Last Therapy Session (1) and Review of Medication Regimen & Side Effects (2) Problem Points:  Established problem, stable/improving (1), Review of last therapy session (1) and Review of psycho-social stressors (1) Data Points:  Review of medication regiment & side effects (2) Review of new medications or change in dosage (2)    Nichele Slawson 09/11/2014, 12:00 PM

## 2014-09-11 NOTE — Clinical Social Work Note (Signed)
CSW left message for pt's Probation officer, [place] Williams per pt request [phone]). Pt stated that he also was able to speak with his PO who is aware of pt's hospitalization.  The Sherwin-WilliamsHeather Smart, LCSWA 09/11/2014 11:03 AM

## 2014-09-11 NOTE — Progress Notes (Signed)
D)  Has been up and about this evening, spent most of the evening in the 400 dayroom watching tv.  Attended group and participated, has been compliant with meds.  Affect has been bright, laughing and joking with peers, enjoying the football game, loud at times, talked to the mhts about sports afterward.  Had a snack and came back to 500 hall when the dayroom was closing down.  Has voiced no c/o's this evening, other than stating his anxiety had been building this evening and requested ativan to help him sleep.   A)  Will continue to monitor for safety, continue POC R)  Safety maintained.

## 2014-09-11 NOTE — Progress Notes (Signed)
D: Pt reports decreasing depression. Pt rates depression 6/10. Anxiety 6/10. Hopeless 6/10. Pt stated that his medications are starting to help decrease his depression some. Pt reported difficulty sleeping last night. Pt compliant with attending groups and taking meds. Pt denies feeling suicidal today and reported that he lasted felt suicidal yesterday. A: Medications administered as ordered per MD. Verbal support given. Pt encouraged to attend groups. 15 minute checks performed for safety.  R: Pt stated goal is to work on his relationship. Pt receptive to treatment.

## 2014-09-11 NOTE — Progress Notes (Signed)
Patient ID: Russell SessionsChristopher [name], male   DOB: 26-Oct-1970, 44 y.o.   MRN: [id number] D: Client pleasant, animated, visible on the unit on the phone and interacting with peers. Client report he's been on the phone trying to see if they were going to let him come home or not. Client also notes if that didn't work he would go to plan B. Client indicated that he was looking at a male client for shelter. A: Writer reviewed medications, explained to client that Ativan was not ordered for sleep. Staff will monitor q6915min for safety. R: Client reported "I'm not going through withdrawals, but I am anxious" Client attended group.
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
## 2014-09-11 NOTE — [facility] Group Notes (Signed)
[facility]   09/11/2014 9:49 AM  Participation Quality:  Appropriate   Mood/Affect:  Depressed  Depression Rating:  6  Anxiety Rating:  6  Thoughts of Suicide:  No Will you contract for safety?   NA  Current AVH:  No  Plan for Discharge/Comments:  Pt reports that he is working on some things to make sure he has a place to go at d/c but is resistant to discussing at this time. Pt [facility]. CSW assessing.   Transportation Means: bus  Supports: none identified. Pt requesting that CSW contact PO Italychad williams today at [phone]  Smart, Herbert SetaHeather LCSWA

## 2014-09-11 NOTE — [facility] Suicide Risk Assessment (Signed)
[facility] INPATIENT:  Family/Significant Other Suicide Prevention Education  Suicide Prevention Education:  Patient Refusal for Family/Significant Other Suicide Prevention Education: The patient Russell SessionsChristopher Eakle has refused to provide written consent for family/significant other to be provided Family/Significant Other Suicide Prevention Education during admission and/or prior to discharge.  Physician notified.  SPE completed with pt, as he refused to consent to family contact. SPI pamphlet provided to pt and he was encouraged to share information with support network, ask questions, and talk about any concerns relating to SPE.  Smart, Ugochi Henzler LCSWA  09/11/2014, 11:32 AM

## 2014-09-12 NOTE — Plan of Care (Signed)
Problem: Alteration in mood & ability to function due to Goal: LTG-Patient demonstrates decreased signs of withdrawal Pt reporting moderate withdrawals (muscle cramps and stomach pain). Pt also reports anxiety this morning. No CIWA/COWS and pt is not on detox protocol. Low sitting BP. By d/c, pt will report no signs of withdrawal with stable vitals. Goal not met. [place], LCSWA 09/06/2014 11:00 AM   Pt reporting no symptoms of withdrawal with stable vitals. No COWS/CIWA score taken. Goal met. [place], [place] 09/11/2014 10:29 AM  Outcome: Completed/Met Date Met:  09/12/14 Pt reports no symptoms of withdrawal. CIWA/COWS both at 0.

## 2014-09-12 NOTE — Progress Notes (Signed)
Patient ID: Russell SessionsChristopher [name], male   DOB: 1971/08/04, 44 y.o.   MRN: [id number]  Pt currently presents with a flat affect and anxious behavior. Per self inventory, pt rates depression at a 6, hopelessness 3 and anxiety 5. Pt's daily goal is to "discharge plan" and they intend to do so by "talk to family." Pt reports fair sleep, good concentration and a fair appetite. Pt has been attending and participating in groups today. Pt reports some stomach pain. Pt wants to reach out to his ex and daughter to see if he can live with them when he is discharged.   Pt provided with medications per providers orders. Pt's labs and vitals were monitored throughout the day. Pt supported emotionally and encouraged to express concerns and questions. Pt educated on medications and reoriented to unit rules.   Pt's safety ensured with 15 minute and environmental checks. Pt currently denies SI/HI and A/V hallucinations. Pt verbally agrees to seek staff if SI/HI or A/VH occurs and to consult with staff before acting on these thoughts.

## 2014-09-12 NOTE — Clinical Social Work Note (Signed)
CSW met with pt and MD to discuss impending d/c. Pt agreeable to d/c tomorrow and stated that he is working with his girlfriend to be able to return to her home at d/c. Pt to follow-up with CSW after lunch. Pt also provided with [facility] per his request. Pt open to getting appts rescheduled at [facility] for med management and therapy. He was given info about how to apply for disability and number for medical records to request records at d/c. Pt reports feeling better today and is active on milieu and attending groups.  [place], [place] 09/12/2014 11:24 AM

## 2014-09-12 NOTE — Progress Notes (Signed)
Adult Psychoeducational Group Note  Date:  09/12/2014 Time:  9:12 PM  Group Topic/Focus:  Wrap-Up Group:   The focus of this group is to help patients review their daily goal of treatment and discuss progress on daily workbooks.  Participation Level:  Active  Participation Quality:  Appropriate  Affect:  Appropriate  Cognitive:  Appropriate  Insight: Appropriate  Engagement in Group:  Engaged  Modes of Intervention:  Discussion  Additional Comments:  Pt goal was to stay positive and to work on his d/c planning. Pt accomplished his goal by staying positive today along with working on his d/c plans.  Casilda CarlsKELLY, Tayona Sarnowski H 09/12/2014, 9:12 PM
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
## 2014-09-12 NOTE — Progress Notes (Signed)
Patient ID: Russell SessionsChristopher [name], male   DOB: Apr 14, 1971, 44 y.o.   MRN: [id number] D: Client visible on the unit, seen several time on the phone, and in dayroom watching TV, interacting with peers. Client smiling, animated, reports depression at "5"  Of 10. "talking to my girlfriend to see if she will let me come back home" "I think she going to take me back, she keep calling" A: Writer provided emotional support, reviewed medications, administered as ordered. Staff will monitor q6315min for safety. R: client is safe on the unit, attended group.

## 2014-09-12 NOTE — Progress Notes (Signed)
Patient ID: Russell [name], male   DOB: August 25, 1970, 44 y.o.   MRN: [id number] Adult Psychoeducational Group Note  Date:  09/12/2014 Time: 0910  Group Topic/Focus:  Orientation:   The focus of this group is to educate the patient on the purpose and policies of crisis stabilization and provide a format to answer questions about their admission.  The group details unit policies and expectations of patients while admitted.  Participation Level:  Active  Participation Quality:  Intrusive, Monopolizing and Redirectable  Affect:  Anxious and Resistant  Cognitive:  Alert and Oriented  Insight: Lacking  Engagement in Group:  Distracting, Monopolizing and Resistant  Modes of Intervention:  Discussion, Education and Orientation  Additional Comments:  Pt participated in group. Pt was monopolizing but redirectable. Pt was verbally aggressive towards staff.   Aurora Maskwyman, Aubri Gathright E 09/12/2014, 1:57 PM

## 2014-09-12 NOTE — Progress Notes (Signed)
Patient ID: Russell SessionsChristopher Burgo, male   DOB: 1970-08-31, 44 y.o.   MRN: [id number] [facility] Progress Note  09/12/2014 4:20 PM Russell SessionsChristopher Sorenson  MRN:  [id number]   Subjective:   Patient reports some improvement. Does report ongoing depression and anxiety regarding marital/ family issues   Objective:  Patient case reviewed and discussed in treatment team. As per staff, patient has been going to groups, is interactive with peers. He has been noted by staff  to be somewhat flirtatious  at times. His mood/affect in groups and interactions is much improved. Patient does report some ongoing depression / anxiety, particularly regarding his relationship with ( common law) wife, with whom he has been living for approx. 13 years. He states they have been having some increased discord , trust issues, and she asked him to leave. He is hoping he will be able to return, and has spoken with her on phone and states she is considering this option. We have offered a couples /family session, but patient states he does not think she will attend. He is future oriented, and states " If I can't go back , I may be able to go to an [facility]". He is also interested in establishing ongoing outpatient management with Neurologist regarding history of seizure disorder. Patient has gradually become more insightful and better able to identify issues that may be affecting his relationships/ quality of life, with less tendency towards externalizing/blaming others. He is tolerating medications well, denies side effects.     Principal Problem: Major depressive disorder, recurrent  Diagnosis:   Patient Active Problem List   Diagnosis Date Noted  . MDD (major depressive disorder), recurrent episode, severe [F33.2] 09/09/2014  . Schizoaffective disorder, bipolar type [F25.0] 09/05/2014  . Opiate dependence [F11.20] 09/04/2014  . Substance induced mood disorder [F19.94] 04/25/2014    Class: Chronic  . Benzodiazepine  dependence [F13.20] 04/21/2014  . Suicidal ideation [R45.851] 04/19/2014   Total Time spent with patient: 25 minutes    Past Medical History:  Past Medical History  Diagnosis Date  . Hernia   . Bipolar 1 disorder   . Epilepsy   . Schizophrenia     Past Surgical History  Procedure Laterality Date  . Ankle fracture surgery  1994 - approximate  . Ligament repair  1998    right arm   Family History: History reviewed. No pertinent family history. Social History:  History  Alcohol Use  . Yes    Comment: socially     History  Drug Use No    History   Social History  . Marital Status: Single    Spouse Name: N/A    Number of Children: N/A  . Years of Education: N/A   Social History Main Topics  . Smoking status: Current Some Day Smoker    Types: Cigarettes  . Smokeless tobacco: Never Used  . Alcohol Use: Yes     Comment: socially  . Drug Use: No  . Sexual Activity: None   Other Topics Concern  . None   Social History Narrative   Additional History:    Sleep: fair  Appetite:  Fair   Assessment:   Musculoskeletal: Strength & Muscle Tone: within normal limits Gait & Station: normal Patient leans: N/A   Psychiatric Specialty Exam: Physical Exam  ROS  Blood pressure 145/87, pulse 95, temperature 98.1 F (36.7 C), temperature source Oral, resp. rate 18, height 6\' 1"  (1.854 m), weight 210 lb (95.255 kg), SpO2 98 %.Body mass index is 27.71  kg/(m^2).  General Appearance: Well Groomed  Patent attorney::  Good  Speech:  Normal Rate  Volume:  Normal  Mood:  improving gradually  Affect:  Appropriate and reactive,   Thought Process:  Goal Directed and Linear  Orientation:  Full (Time, Place, and Person)  Thought Content:  Rumination and no hallucinations , no delusions  Suicidal Thoughts:  No   - at this time denies any plan or intention of hurting self or anyone else     Homicidal Thoughts:  No  Memory: Recent and Remote grossly intact  Judgement:  Other:   improving  Insight:  Fair  Psychomotor Activity:  Normal  Concentration:  Fair  Recall:  Good  Fund of Knowledge:Good  Language: Good  Akathisia:  Negative  Handed:  Right  AIMS (if indicated):     Assets:  Desire for Improvement Resilience  ADL's:fair   Cognition: intact   Sleep:  Number of Hours: 6     Current Medications: Current Facility-Administered Medications  Medication Dose Route Frequency Provider Last Rate Last Dose  . divalproex (DEPAKOTE) DR tablet 500 mg  500 mg Oral BID WC Earney Navy, NP   500 mg at 09/12/14 0836  . guanFACINE (TENEX) tablet 1 mg  1 mg Oral BID Craige Cotta, MD   1 mg at 09/12/14 0836  . ibuprofen (ADVIL,MOTRIN) tablet 400 mg  400 mg Oral Daily PRN Earney Navy, NP      . LORazepam (ATIVAN) tablet 1 mg  1 mg Oral Q6H PRN Earney Navy, NP   1 mg at 09/11/14 2306  . meloxicam (MOBIC) tablet 7.5 mg  7.5 mg Oral Daily PRN Earney Navy, NP      . pantoprazole (PROTONIX) EC tablet 40 mg  40 mg Oral Daily Earney Navy, NP   40 mg at 09/12/14 0836  . sertraline (ZOLOFT) tablet 100 mg  100 mg Oral Daily Craige Cotta, MD   100 mg at 09/12/14 0836  . traZODone (DESYREL) tablet 100 mg  100 mg Oral QHS Craige Cotta, MD   100 mg at 09/11/14 2304    Lab Results:  No results found for this or any previous visit (from the past 48 hour(s)).  Physical Findings: AIMS: Facial and Oral Movements Muscles of Facial Expression: None, normal Lips and Perioral Area: None, normal Jaw: None, normal Tongue: None, normal,Extremity Movements Upper (arms, wrists, hands, fingers): None, normal Lower (legs, knees, ankles, toes): None, normal, Trunk Movements Neck, shoulders, hips: None, normal, Overall Severity Severity of abnormal movements (highest score from questions above): None, normal Incapacitation due to abnormal movements: None, normal Patient's awareness of abnormal movements (rate only patient's report): No Awareness,  Dental Status Current problems with teeth and/or dentures?: No Does patient usually wear dentures?: No  CIWA:    COWS:      Assessment-  Patient is improving. Affect improved, and fuller in range. More sociable/interactive, and more future oriented, looking into post discharge options. Tolerating medications well at this time. No seizures on unit. No SI.    Treatment Plan Summary: Daily contact with patient to assess and evaluate symptoms and progress in treatment, Medication management, Plan Continue inpatient treatment and continue medications as below  Treatment team working on disposition options Depakote ER 500 mgrs BID Zoloft  100  mg daily.   Guanfacine 1 mgr BID Trazodone  100 mgrs QHS PRN Insomnia.   Medical Decision Making:  Established Problem, Stable/Improving (1), Review of Psycho-Social  Stressors (1), Review of Last Therapy Session (1) and Review of Medication Regimen & Side Effects (2) Problem Points:  Established problem, stable/improving (1), Review of last therapy session (1) and Review of psycho-social stressors (1) Data Points:  Review of medication regiment & side effects (2)    COBOS, FERNANDO 09/12/2014, 4:20 PM

## 2014-09-12 NOTE — Progress Notes (Signed)
Recreation Therapy Notes  Animal-Assisted Activity/Therapy (AAA/T) Program Checklist/Progress Notes Patient Eligibility Criteria Checklist & Daily Group note for Rec Tx Intervention  Date: 01.26.2016 Time: 2:45pm Location: [address] Dayroom   AAA/T Program Assumption of Risk Form signed by Patient/ or Parent Legal Guardian yes  Patient is free of allergies or sever asthma yes  Patient reports no fear of animals yes  Patient reports no history of cruelty to animals yes  Patient understands his/her participation is voluntary yes  Behavioral Response: Did not attend.   [name] [name], LRT/CTRS  [name] L 09/12/2014 4:39 PM 

## 2014-09-12 NOTE — [facility] Group Notes (Signed)
[facility] LCSW Group Therapy  09/12/2014 1:23 PM  Type of Therapy:  Group Therapy  Participation Level:  Active  Participation Quality:  Attentive  Affect:  Appropriate  Cognitive:  Alert and Oriented  Insight:  Improving  Engagement in Therapy:  Engaged  Modes of Intervention:  Discussion, Education, Exploration, Problem-solving, Rapport Building, Socialization and Support  Summary of Progress/Problems: [facility]HA Speaker came to talk about his personal journey with substance abuse and addiction. The pt processed ways by which to relate to the speaker. MHA speaker provided handouts and educational information pertaining to groups and services offered by the [facility].    Smart, Johnda Billiot LCSWA 09/12/2014, 1:23 PM

## 2014-09-13 MED ORDER — GUANFACINE HCL 1 MG PO TABS: 1.0000 mg | ORAL_TABLET | Freq: Two times a day (BID) | ORAL | Status: DC

## 2014-09-13 MED ORDER — TRAZODONE HCL 100 MG PO TABS: 100.0000 mg | ORAL_TABLET | Freq: Every day | ORAL | Status: DC

## 2014-09-13 MED ORDER — SERTRALINE HCL 100 MG PO TABS: 100.0000 mg | ORAL_TABLET | Freq: Every day | ORAL | Status: DC

## 2014-09-13 MED ORDER — DIVALPROEX SODIUM 500 MG PO DR TAB: 500.0000 mg | DELAYED_RELEASE_TABLET | Freq: Two times a day (BID) | ORAL | Status: DC

## 2014-09-13 MED ORDER — MELOXICAM 7.5 MG PO TABS: 7.5000 mg | ORAL_TABLET | Freq: Every day | ORAL | Status: DC | PRN

## 2014-09-13 NOTE — Progress Notes (Signed)
  [facility] :  Will you be returning to the same living situation after discharge:  Yes,  home with wife At discharge, do you have transportation home?: Yes,  CSW working with [facility] in attempt to get him transportation home. if not, possibly cab voucher. Do you have the ability to pay for your medications: Yes,  [facility]  Release of information consent forms completed and submitted to Medical Records by CSW.  Patient to Follow up at: Follow-up Information    Follow up with [facility]  On 09/19/2014.   Why:  Appt with Ellis SavageLisa Poulos NP at 2:00PM on this date.    Contact information:   [address] [place], [place] [address] Phone: [phone] Fax: [phone]      Follow up with [facility] On 10/24/2014.   Why:  Appt for therapy with Almond LintLynn Norris on this date at 10:00AM.    Contact information:   [id number] [place], [place] [address] Phone: [phone] Fax: [phone]      Patient denies SI/HI: Yes,  during group/self report.     Safety Planning and Suicide Prevention discussed: Yes,  SPE completed with pt, as he refused to consent to family contact. SPI pamphlet provided to pt and he was encouraged to share information with support network, ask questions, and talk about any concerns relating to SPE.  Has patient been referred to the Quitline?: Patient refused referral  Smart, Lebron QuamHeather LCSWA  09/13/2014, 9:46 AM

## 2014-09-13 NOTE — Plan of Care (Signed)
Problem: Diagnosis: Increased Risk For Suicide Attempt Goal: LTG-Patient Will Show Positive Response to Medication LTG (by discharge) : Patient will show positive response to medication and will participate in the development of the discharge plan.  Outcome: Progressing Client affect brighter, reports sleeping well slept over 5 hours last pm. "yea I slept last night, medicine put me right out"

## 2014-09-13 NOTE — Progress Notes (Signed)
Patient ID: Russell SessionsChristopher [name], male   DOB: [birth year], 44 y.o.   MRN: [id number]  Pt currently presents with an appropriate affect and behavior. Per self inventory, pt rates depression at a 5, hopelessness 5 and anxiety 5. Pt's daily goal is to "getting home" and they intend to do so by "meet with dr/cw." Pt reports fair sleep, good concentration and a good appetite.   Pt provided with medications per providers orders. Pt's labs and vitals were monitored throughout the day. Pt supported emotionally and encouraged to express concerns and questions. Pt educated on medications and coping skills .  Pt's safety ensured with 15 minute and environmental checks. Pt currently denies SI/HI and A/V hallucinations. Pt verbally agrees to seek staff if SI/HI or A/VH occurs and to consult with staff before acting on these thoughts. Pt to be discharged today.

## 2014-09-13 NOTE — Discharge Summary (Signed)
Physician Discharge Summary Note  Patient:  Russell SessionsChristopher [name] is an 44 y.o., male MRN:  [id number] DOB:  Jan 06, 1971 Patient phone:  [phone] (home)  Patient address:   [address] [place] [address],  Total Time spent with patient: 30 minutes  Date of Admission:  09/05/2014 Date of Discharge: 09/13/2014  Reason for Admission:  Depression, suicidal thoughts  Principal Problem: MDD (major depressive disorder), recurrent episode, severe Discharge Diagnoses: Patient Active Problem List   Diagnosis Date Noted  . MDD (major depressive disorder), recurrent episode, severe [F33.2] 09/09/2014  . Schizoaffective disorder, bipolar type [F25.0] 09/05/2014  . Opiate dependence [F11.20] 09/04/2014  . Substance induced mood disorder [F19.94] 04/25/2014    Class: Chronic  . Benzodiazepine dependence [F13.20] 04/21/2014  . Suicidal ideation [R45.851] 04/19/2014    Musculoskeletal: Strength & Muscle Tone: within normal limits Gait & Station: normal Patient leans: N/A  Psychiatric Specialty Exam: Physical Exam  Vitals reviewed.   Review of Systems  Constitutional: Negative.   HENT: Negative.   Eyes: Negative.   Respiratory: Negative.   Cardiovascular: Negative.   Gastrointestinal: Negative.   Genitourinary: Negative.   Musculoskeletal: Negative.   Skin: Negative.   Neurological: Negative.   Endo/Heme/Allergies: Negative.   Psychiatric/Behavioral: Positive for depression and hallucinations. Negative for suicidal ideas and substance abuse. The patient is not nervous/anxious and does not have insomnia.     Blood pressure 162/67, pulse 57, temperature 98 F (36.7 C), temperature source Oral, resp. rate 18, height 6\' 1"  (1.854 m), weight 95.255 kg (210 lb), SpO2 98 %.Body mass index is 27.71 kg/(m^2).   Past Medical History:  Past Medical History  Diagnosis Date  . Hernia   . Bipolar 1 disorder   . Epilepsy   . Schizophrenia     Past Surgical History  Procedure  Laterality Date  . Ankle fracture surgery  1994 - approximate  . Ligament repair  1998    right arm   Family History: History reviewed. No pertinent family history. Social History:  History  Alcohol Use  . Yes    Comment: socially     History  Drug Use No    History   Social History  . Marital Status: Single    Spouse Name: N/A    Number of Children: N/A  . Years of Education: N/A   Social History Main Topics  . Smoking status: Current Some Day Smoker    Types: Cigarettes  . Smokeless tobacco: Never Used  . Alcohol Use: Yes     Comment: socially  . Drug Use: No  . Sexual Activity: None   Other Topics Concern  . None   Social History Narrative   Past Psychiatric History: Diagnosis: Opioid dependence, Benzodiazepine dependence, Hx paranoid schizophrenia  [facility]  Outpatient Care: With dr, Angelina Pihhandos  Substance Abuse Care: None reported  Self-Mutilation: NA  Suicidal Attempts: Denies attempts,admits thoughts  Violent Behaviors: NA   Risk to Self:   Risk to Others:   Prior Inpatient Therapy:   Prior Outpatient Therapy:    Level of Care:  OP  Hospital Course:  Per admission HPI:   Patient states he is depressed and having suicidal thoughts. 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 I can't drive cause of my epilepsy and I've lost my license; now I can't work and I don have disability.  Nobody wants to deal with or be around anybody that can't do nothing for their self." Patient states that he continues to have thoughts of killing himself. States that he hears voices off and on but denies at this moment. States that the voices are not command; they sound like a lot of people talking at one time and can't make out what they are saying. States that he also sees shadows  sometime but denies at this time. Patient also endorses paranoia stating that he is afraid that someone is trying to hurt him "I stay afraid all the time; I keep my doors locked and I just feel scared like somebody is trying to get me." Patient denies homicidal ideation. Patient has a history of inpatient/outpatient services, and psychotropic. Out patient services with Ellis Savage, NP at [facility]. Patient states that he has not seen provider in 2 months.  Patient denies daily alcohol consumption and illicit drug use.   Jerred was admitted for further treatment and stabilization at [facility].  Patient was monitored daily for treatment effect and medication tolerance.  There were no reports of adverse side effects on Depakote 500 mg for mood stabilization and Sertraline 10 mg for depression.  Patient reports some improvement and minimizing ongoing depression and anxiety regarding marital/ family issues.  He interacted well with his peers.  He also verbalized establishing ongoing outpatient management with Neurologist regarding history of seizure disorder.  Patient has gradually become more insightful and better able to identify issues that may be affecting his relationships/ quality of life, with less tendency towards externalizing/blaming others. He is tolerating medications well, denies side effects.  At time of discharge, he rated both depression and anxiety levels to be manageable and minimal. He verbalized that he may work out relationship issues  Denies physiological concerns/SI/HI/AVH at time of discharge.  He was encouraged to continue to follow medication regimen as prescribed and to follow up with outpatient treatment at [facility].  Consults:  psychiatry  Significant Diagnostic Studies:  labs: per ED  Discharge Vitals:   Blood pressure 162/67, pulse 57, temperature 98 F (36.7 C), temperature source Oral, resp. rate 18, height  (1.854 m), weight 95.255 kg (210 lb), SpO2 98  %. Body mass index is 27.71 kg/(m^2). Lab Results:   No results found for this or any previous visit (from the past 72 hour(s)).  Physical Findings: AIMS: Facial and Oral Movements Muscles of Facial Expression: None, normal Lips and Perioral Area: None, normal Jaw: None, normal Tongue: None, normal,Extremity Movements Upper (arms, wrists, hands, fingers): None, normal Lower (legs, knees, ankles, toes): None, normal, Trunk Movements Neck, shoulders, hips: None, normal, Overall Severity Severity of abnormal movements (highest score from questions above): None, normal Incapacitation due to abnormal movements: None, normal Patient's awareness of abnormal movements (rate only patient's report): No Awareness, Dental Status Current problems with teeth and/or dentures?: No Does patient usually wear dentures?: No  CIWA:    COWS:     See Psychiatric Specialty Exam and Suicide Risk Assessment completed by Attending Physician prior to discharge.  Discharge destination:  Home  Is patient on multiple antipsychotic therapies at discharge:  No   Has Patient had three or more failed trials of antipsychotic monotherapy by history:  No   Recommended Plan for Multiple Antipsychotic Therapies: NA    Medication List    STOP taking these medications        alprazolam 2 MG tablet  Commonly known as:  Prudy Feeler  HYDROcodone-acetaminophen 5-325 MG per tablet  Commonly known as:  NORCO/VICODIN     ibuprofen 200 MG tablet  Commonly known as:  ADVIL,MOTRIN     oxyCODONE-acetaminophen 10-325 MG per tablet  Commonly known as:  PERCOCET      TAKE these medications      Indication   divalproex 500 MG DR tablet  Commonly known as:  DEPAKOTE  Take 1 tablet (500 mg total) by mouth 2 (two) times daily with a meal.   Indication:  Seizure Disorder     guanFACINE 1 MG tablet  Commonly known as:  TENEX  Take 1 tablet (1 mg total) by mouth 2 (two) times daily.   Indication:  Episodic Migraine  Headache Prophylaxis     meloxicam 7.5 MG tablet  Commonly known as:  MOBIC  Take 1 tablet (7.5 mg total) by mouth daily as needed (pain).   Indication:  pain     omeprazole 40 MG capsule  Commonly known as:  PRILOSEC  Take 2 capsules (80 mg total) by mouth daily. For acid reflux   Indication:  Gastroesophageal Reflux Disease     sertraline 100 MG tablet  Commonly known as:  ZOLOFT  Take 1 tablet (100 mg total) by mouth daily.   Indication:  Major Depressive Disorder     traZODone 100 MG tablet  Commonly known as:  DESYREL  Take 1 tablet (100 mg total) by mouth at bedtime.   Indication:  Trouble Sleeping       Follow-up Information    Follow up with [facility]  On 09/19/2014.   Why:  Appt with Ellis Savage NP at 2:00PM on this date.    Contact information:   [address] [place], [place] [address] Phone: [phone] Fax: [phone]      Follow up with [facility] On 10/24/2014.   Why:  Appt for therapy with Almond Lint on this date at 10:00AM.    Contact information:   [address] [place], [place] [address] Phone: [phone] Fax: [phone]      Follow-up recommendations:  Activity:   as tol, diet as tol  Comments:  1.  Take all your medications as prescribed.              2.  Report any adverse side effects to outpatient provider.                       3.  Patient instructed to not use alcohol or illegal drugs while on prescription medicines.            4.  In the event of worsening symptoms, instructed patient to call 911, the crisis hotline or go to nearest emergency room for evaluation of symptoms.  Total Discharge Time:  30 min  Signed: Adonis Brook MAY, AGNP-BC 09/13/2014, 5:48 PM   Patient seen, Suicide Assessment Completed.  Disposition Plan Reviewed

## 2014-09-13 NOTE — Progress Notes (Signed)
Patient ID: Russell SessionsChristopher [name], male   DOB: Apr 25, 1971, 44 y.o.   MRN: [id number]  Pt discharged home with Kathlene NovemberMike from his TCT team. Pt was stable and appreciative . All papers and prescriptions were given and valuables returned. Verbal understanding expressed. Denies SI/HI and A/VH. Pt given opportunity to express concerns and ask questions. Pt able to verbalize coping skills for cravings and anger management to utilize outside of the facility.

## 2014-09-13 NOTE — [facility] Group Notes (Signed)
[facility]   09/13/2014 9:26 AM  Participation Quality:  Appropriate   Mood/Affect:  Appropriate  Depression Rating:  0  Anxiety Rating:  5-"nervous about going back home to my wife."   Thoughts of Suicide:  No Will you contract for safety?   NA  Current AVH:  No  Plan for Discharge/Comments:  Pt reports that his wife will be taking him back. Pt has [facility] and [facility].   Transportation Means: CSW assessing-[facility]?   Supports: wife; some family supports.   Smart, Avery DennisonHeather

## 2014-09-13 NOTE — Clinical Social Work Note (Signed)
[facility] will transport pt home today between 1pm-4pm. Pt's RN Huntley Dec(Sara) notified.  The Sherwin-WilliamsHeather Smart, LCSWA 09/13/2014 10:46 AM

## 2014-09-13 NOTE — [facility] Suicide Risk Assessment (Signed)
[facility] Discharge Suicide Risk Assessment   Demographic Factors:  44 year old male, lives with wife, currently unemployed   Total Time spent with patient: 30 minutes  Musculoskeletal: Strength & Muscle Tone: within normal limits Gait & Station: normal Patient leans: N/A  Psychiatric Specialty Exam: Physical Exam  ROS  Blood pressure 169/76, pulse 57, temperature 98 F (36.7 C), temperature source Oral, resp. rate 18, height  (1.854 m), weight 210 lb (95.255 kg), SpO2 98 %.Body mass index is 27.71 kg/(m^2).  General Appearance: improved grooming  Eye Contact::  Good  Speech:  Normal Rate  Volume:  Normal  Mood:  improved, and today seems euthymic  Affect:  Appropriate  Thought Process:  Goal Directed and Linear  Orientation:  Full (Time, Place, and Person)  Thought Content:  no hallucinations, no delusions  Suicidal Thoughts:  No  Homicidal Thoughts:  No  Memory:  recent and remote grossly intact   Judgement:  Other:  improved  Insight:  improved  Psychomotor Activity:  Normal  Concentration:  Good  Recall:  Good  Fund of Knowledge:Good  Language: Good  Akathisia:  Negative  Handed:  Right  AIMS (if indicated):     Assets:  Desire for Improvement Social Support  Sleep:  Number of Hours: 6  Cognition: WNL  ADL's:  Intact   Have you used any form of tobacco in the last 30 days? (Cigarettes, Smokeless Tobacco, Cigars, and/or Pipes): Yes  Has this patient used any form of tobacco in the last 30 days? (Cigarettes, Smokeless Tobacco, Cigars, and/or Pipes) Yes, A prescription for an FDA-approved tobacco cessation medication was offered at discharge and the patient refused  Mental Status Per Nursing Assessment::   On Admission:     Current Mental Status by Physician: At this time patient is improved, with improved mood , improved range of affect, no thought disorder, no SI or HI, no psychotic symptoms, future oriented.  Loss Factors: Marital tension, unemployment    Historical Factors: History of depression, history of opiate dependence , prior psychiatric admissions  Risk Reduction Factors:   Responsible for children under 66 years of age, Sense of responsibility to family, Living with another person, especially a relative and Positive coping skills or problem solving skills  Continued Clinical Symptoms:  At this time patient improved compared to admission. Not suicidal or homicidal, not psychotic, mood improved, affect brighter, future oriented. Looking forward to returning home.  Cognitive Features That Contribute To Risk:  No gross cognitive deficits noted upon discharge. Is alert , attentive, and oriented x 3   Suicide Risk:  Mild:  Suicidal ideation of limited frequency, intensity, duration, and specificity.  There are no identifiable plans, no associated intent, mild dysphoria and related symptoms, good self-control (both objective and subjective assessment), few other risk factors, and identifiable protective factors, including available and accessible social support.  Principal Problem:  Depression Discharge Diagnoses:  Patient Active Problem List   Diagnosis Date Noted  . MDD (major depressive disorder), recurrent episode, severe [F33.2] 09/09/2014  . Schizoaffective disorder, bipolar type [F25.0] 09/05/2014  . Opiate dependence [F11.20] 09/04/2014  . Substance induced mood disorder [F19.94] 04/25/2014    Class: Chronic  . Benzodiazepine dependence [F13.20] 04/21/2014  . Suicidal ideation [R45.851] 04/19/2014    Follow-up Information    Follow up with [facility]  On 09/19/2014.   Why:  Appt with Ellis Savage NP at 2:00PM on this date.    Contact information:   [address]  [address] [place], [place] [address] Phone: [phone] Fax: [phone]      Follow up with [facility] On 10/24/2014.   Why:  Appt for therapy with Almond LintLynn Norris on this date at 10:00AM.    Contact information:   [address] [place], [place] [address] Phone: [phone] Fax: [phone]      Plan Of Care/Follow-up recommendations:  Activity:  as tolerated Diet:  Regular Tests:  NA Other:  See below  Is patient on multiple antipsychotic therapies at discharge:  No   Has Patient had three or more failed trials of antipsychotic monotherapy by history:  No  Recommended Plan for Multiple Antipsychotic Therapies: NA  Patient is leaving in good spirits. Plans to return home, and states his wife is allowing him to return home .  Follow up as above. Follow up with Neurologist for ongoing treatment of seizure disorder.    COBOS, FERNANDO 09/13/2014, 8:40 AM

## 2014-09-18 NOTE — Progress Notes (Signed)
Patient Discharge Instructions:  After Visit Summary (AVS):   Faxed to:  09/18/14 Discharge Summary Note:   Faxed to:  09/18/14 Psychiatric Admission Assessment Note:   Faxed to:  09/18/14 Suicide Risk Assessment - Discharge Assessment:   Faxed to:  09/18/14 Faxed/Sent to the Next Level Care provider:  09/18/14  Faxed to [facility] @ [phone]  Jerelene ReddenSheena E Combine, 09/18/2014, 11:43 AM

## 2014-10-17 ENCOUNTER — Institutional Professional Consult (permissible substitution): Payer: Medicaid Other | Admitting: Neurology

## 2014-10-20 ENCOUNTER — Emergency Department (HOSPITAL_COMMUNITY)
Admission: EM | Admit: 2014-10-20 | Discharge: 2014-10-20 | Disposition: A | Discharge status: Home / Self Care | Payer: Medicaid Other | Attending: Emergency Medicine | Admitting: Emergency Medicine

## 2014-10-20 ENCOUNTER — Encounter (HOSPITAL_COMMUNITY): Payer: Self-pay | Admitting: Emergency Medicine

## 2014-10-20 DIAGNOSIS — T7840XA Allergy, unspecified, initial encounter: Secondary | ICD-10-CM | POA: Diagnosis not present

## 2014-10-20 DIAGNOSIS — S90522A Blister (nonthermal), left ankle, initial encounter: Secondary | ICD-10-CM

## 2014-10-20 DIAGNOSIS — Z79899 Other long term (current) drug therapy: Secondary | ICD-10-CM | POA: Insufficient documentation

## 2014-10-20 DIAGNOSIS — R21 Rash and other nonspecific skin eruption: Secondary | ICD-10-CM | POA: Diagnosis present

## 2014-10-20 DIAGNOSIS — L500 Allergic urticaria: Secondary | ICD-10-CM | POA: Insufficient documentation

## 2014-10-20 DIAGNOSIS — G40909 Epilepsy, unspecified, not intractable, without status epilepticus: Secondary | ICD-10-CM | POA: Insufficient documentation

## 2014-10-20 DIAGNOSIS — Z720 Tobacco use: Secondary | ICD-10-CM | POA: Insufficient documentation

## 2014-10-20 DIAGNOSIS — F209 Schizophrenia, unspecified: Secondary | ICD-10-CM | POA: Insufficient documentation

## 2014-10-20 DIAGNOSIS — M7989 Other specified soft tissue disorders: Secondary | ICD-10-CM | POA: Diagnosis not present

## 2014-10-20 DIAGNOSIS — M25571 Pain in right ankle and joints of right foot: Secondary | ICD-10-CM

## 2014-10-20 DIAGNOSIS — Z8719 Personal history of other diseases of the digestive system: Secondary | ICD-10-CM | POA: Insufficient documentation

## 2014-10-20 DIAGNOSIS — F319 Bipolar disorder, unspecified: Secondary | ICD-10-CM | POA: Insufficient documentation

## 2014-10-20 DIAGNOSIS — Z9889 Other specified postprocedural states: Secondary | ICD-10-CM | POA: Diagnosis not present

## 2014-10-20 DIAGNOSIS — R0602 Shortness of breath: Secondary | ICD-10-CM | POA: Diagnosis not present

## 2014-10-20 MED ORDER — OXYCODONE-ACETAMINOPHEN 5-325 MG PO TABS
2.0000 | ORAL_TABLET | Freq: Once | ORAL | Status: AC
  Administered 2014-10-20: 2 via ORAL
  Filled 2014-10-20: qty 2

## 2014-10-20 MED ORDER — OXYCODONE-ACETAMINOPHEN 5-325 MG PO TABS: 1.0000 | ORAL_TABLET | Freq: Once | ORAL | Status: DC

## 2014-10-20 MED ORDER — PREDNISONE 20 MG PO TABS: ORAL_TABLET | ORAL | Status: DC

## 2014-10-20 MED ORDER — DIPHENHYDRAMINE HCL 25 MG PO TABS: 25.0000 mg | ORAL_TABLET | Freq: Four times a day (QID) | ORAL | Status: DC

## 2014-10-20 MED ORDER — EPINEPHRINE 0.3 MG/0.3ML IJ SOAJ: 0.3000 mg | Freq: Once | INTRAMUSCULAR | Status: DC

## 2014-10-20 MED ORDER — OXYCODONE-ACETAMINOPHEN 5-325 MG PO TABS: 1.0000 | ORAL_TABLET | Freq: Four times a day (QID) | ORAL | Status: DC | PRN

## 2014-10-20 NOTE — ED Provider Notes (Signed)
CSN: [id number]     Arrival date & time 10/20/14  1143 History   First MD Initiated Contact with Patient 10/20/14 1201     Chief Complaint  Patient presents with  . Urticaria     (Consider location/radiation/quality/duration/timing/severity/associated sxs/prior Treatment) HPI  Pt is a 44yo male with hx of bipolar 1 disorder, epilepsy, schizophrenia, presenting to ED via EMS with reports of waking of SOB with hives on his back, unknown reason. Pt states he is allergic to ultram but has not had that in several years. Pt states he called EMS as he was sleeping on the couch but was awakened because he could not breath, felt itching in his back and his lips started to become numb.  Denies previous hx of anaphylaxis.  Denies chest pain, nausea or vomiting.  Denies any new medications, clothes, lotions, soaps. EMS gave pt solumedrol , benadryl 30m, and  Zantac in route.  Upon arrival to ED, lungs clear, much improved from initial encounter.    Pt also c/o exacerbation of chronic right ankle pain due to being on his feet more frequently due to a new job.  Reports having ankle surgery on his right ankle about 15-20 years ago but was advised a few years ago by Dr. Eulah Pont that he has bone spurs and was given cortisone injects at that time with little relief due to significant scar tissue.  States he is still on probation for his work so he has not had time to go see his PCP but is requesting a referral to [facility] as well pain medication as pain is gradually worsening over last 1 week. Pain is aching, sore, throbbing, 6/10, worse when on his feet at the end of the day.  He has tried ibuprofen with minimal relief. Denies new falls or injuries.  Pt dose not a blister on the back of his left ankle due to the amount of walking he has been doing recently.   Past Medical History  Diagnosis Date  . Hernia   . Bipolar 1 disorder   . Epilepsy   . Schizophrenia    Past Surgical History   Procedure Laterality Date  . Ankle fracture surgery  1994 - approximate  . Ligament repair  1998    right arm   History reviewed. No pertinent family history. History  Substance Use Topics  . Smoking status: Current Some Day Smoker    Types: Cigarettes  . Smokeless tobacco: Never Used  . Alcohol Use: Yes     Comment: socially    Review of Systems  Constitutional: Negative for fever and chills.  Respiratory: Positive for shortness of breath. Negative for cough.   Cardiovascular: Negative for chest pain, palpitations and leg swelling.  Gastrointestinal: Negative for nausea, vomiting, abdominal pain and diarrhea.  Musculoskeletal: Positive for arthralgias. Negative for joint swelling.  Skin: Positive for rash ( hives on back) and wound. Negative for color change.  Neurological: Positive for numbness ( lips). Negative for weakness.  All other systems reviewed and are negative.     Allergies  Ultram  Home Medications   Prior to Admission medications   Medication Sig Start Date End Date Taking? Authorizing Provider  alprazolam Prudy Feeler) 2 MG tablet Take 2-6 mg by mouth at bedtime as needed for sleep.   Yes Historical Provider, MD  divalproex (DEPAKOTE) 500 MG DR tablet Take 1 tablet (500 mg total) by mouth 2 (two) times daily with a meal. 09/13/14  Yes Lindwood Qua, NP  guanFACINE (TENEX) 1 MG tablet Take 1 tablet (1 mg total) by mouth 2 (two) times daily. Patient taking differently: Take 1 mg by mouth at bedtime.  09/13/14  Yes Velna Hatchet May Agustin, NP  ibuprofen (ADVIL,MOTRIN) 200 MG tablet Take 400 mg by mouth every 4 (four) hours as needed for moderate pain.   Yes Historical Provider, MD  omeprazole (PRILOSEC) 40 MG capsule Take 2 capsules (80 mg total) by mouth daily. For acid reflux 04/25/14  Yes Sanjuana Kava, NP  diphenhydrAMINE (BENADRYL) 25 MG tablet Take 1 tablet (25 mg total) by mouth every 6 (six) hours. 10/20/14   Junius Finner, PA-C  EPINEPHrine 0.3 mg/0.3 mL IJ SOAJ  injection Inject 0.3 mLs (0.3 mg total) into the muscle once. 10/20/14   Junius Finner, PA-C  meloxicam (MOBIC) 7.5 MG tablet Take 1 tablet (7.5 mg total) by mouth daily as needed (pain). Patient not taking: Reported on 10/20/2014 09/13/14   Lindwood Qua, NP  oxyCODONE-acetaminophen (PERCOCET/ROXICET) 5-325 MG per tablet Take 1-2 tablets by mouth every 6 (six) hours as needed for severe pain. 10/20/14   Junius Finner, PA-C  predniSONE (DELTASONE) 20 MG tablet 2 tabs po daily x 3 days 10/20/14   Junius Finner, PA-C  sertraline (ZOLOFT) 100 MG tablet Take 1 tablet (100 mg total) by mouth daily. Patient not taking: Reported on 10/20/2014 09/13/14   Lindwood Qua, NP  traZODone (DESYREL) 100 MG tablet Take 1 tablet (100 mg total) by mouth at bedtime. Patient not taking: Reported on 10/20/2014 09/13/14   Velna Hatchet May Agustin, NP   BP 133/81 mmHg  Pulse 74  Temp(Src) 98.3 F (36.8 C) (Oral)  Resp 14  SpO2 97% Physical Exam  Constitutional: He appears well-developed and well-nourished.  HENT:  Head: Normocephalic and atraumatic.  Eyes: Conjunctivae are normal. No scleral icterus.  Neck: Normal range of motion.  Cardiovascular: Normal rate, regular rhythm and normal heart sounds.   Pulmonary/Chest: Effort normal and breath sounds normal. No respiratory distress. He has no wheezes. He has no rales. He exhibits no tenderness.  Abdominal: Soft. Bowel sounds are normal. He exhibits no distension and no mass. There is no tenderness. There is no rebound and no guarding.  Musculoskeletal: He exhibits tenderness. He exhibits no edema.  Right ankle: well healed surgical scar on lateral aspect.  Mild tenderness to lateral aspect, moderate pain over dorsum of foot. Limited dorsiflexion due to pain, 4/5 plantarflexion.   Neurological: He is alert.  Skin: Skin is warm and dry. Rash noted. No erythema.  Urticaria on back Left ankle: posterior aspect-2cm bullae full of clear fluid. No drainage or bleeding. Right  ankle: skin in tact, no erythema, ecchymosis, or warmth.    Nursing note and vitals reviewed.   ED Course  Procedures (including critical care time) Labs Review Labs Reviewed - No data to display  Imaging Review No results found.   EKG Interpretation None      MDM   Final diagnoses:  Allergic reaction, initial encounter  Right ankle pain  Blister of left ankle, initial encounter    Pt presented to ED with reports of waking up with SOB, numbness in his lips and urticaria of the back. Symptoms improved upon arrival to ED as pt was given solumedrol, benadryl, and zantac PTA.   Pt monitored in ED for 3.5hrs. Pt stated he was ready to be discharged home as his ride only had a "narrow window of time" to pick him up.   Pt also c/o  exacerbation of right ankle pain. No evidence of underlying infection. No hx of recent trauma to ankle. Blister present on posterior left ankle w/o evidence of infection.    Filed Vitals:   10/20/14 1440  BP: 133/81  Pulse: 74  Temp:   Resp: 14   Urticaria improved. Pt appears stable for discharge. Will discharge home with Rx: Epi Pen, benadryl, prednisone for 3 days, as well as small amount of percocet for his exacerbation of chronic right ankle pain. [facility]. Advised to f/u with his PCP for recheck of symptoms.  Strict return precautions provided. Pt verbalized understanding and agreement with tx plan.      Junius Finnerrin O'Malley, PA-C 10/20/14 1645  [address]rin O'Malley, PA-C 10/20/14 1645  Doug SouSam Jacubowitz, MD 10/23/14 [phone]

## 2014-10-20 NOTE — ED Notes (Signed)
Patient ambulated to the restroom without assistance. PA aware and stated no urine sample needed.

## 2014-10-20 NOTE — ED Notes (Signed)
Per EMS, patient woke up with hives on back-no reason why, only allergy is Ultram-was given solumedrol 125 mg, benadryl 50 mg, 50 mg of Zantac in route, lungs clear, no respiratory distress-much improved since initial encounter

## 2014-12-16 ENCOUNTER — Emergency Department (HOSPITAL_COMMUNITY)
Admission: EM | Admit: 2014-12-16 | Discharge: 2014-12-16 | Discharge status: Against Medical Advice | Payer: Medicaid Other | Attending: Emergency Medicine | Admitting: Emergency Medicine

## 2014-12-16 ENCOUNTER — Encounter (HOSPITAL_COMMUNITY): Payer: Self-pay | Admitting: Emergency Medicine

## 2014-12-16 DIAGNOSIS — S61512A Laceration without foreign body of left wrist, initial encounter: Secondary | ICD-10-CM | POA: Insufficient documentation

## 2014-12-16 DIAGNOSIS — Y9389 Activity, other specified: Secondary | ICD-10-CM | POA: Diagnosis not present

## 2014-12-16 DIAGNOSIS — Y998 Other external cause status: Secondary | ICD-10-CM | POA: Diagnosis not present

## 2014-12-16 DIAGNOSIS — X781XXA Intentional self-harm by knife, initial encounter: Secondary | ICD-10-CM | POA: Insufficient documentation

## 2014-12-16 DIAGNOSIS — Z720 Tobacco use: Secondary | ICD-10-CM | POA: Insufficient documentation

## 2014-12-16 DIAGNOSIS — Y9289 Other specified places as the place of occurrence of the external cause: Secondary | ICD-10-CM | POA: Diagnosis not present

## 2014-12-16 NOTE — ED Notes (Signed)
Bed: [facility] Expected date:  Expected time:  Means of arrival:  Comments: EMS SI/Lac to wrist

## 2014-12-16 NOTE — ED Notes (Signed)
Pt not in room when security came to wand patient. Pt was reported leaving EMS bay. Charge RN made aware.

## 2014-12-16 NOTE — ED Notes (Signed)
Pt BIB [facility] from fire station after patient went there for help after cutting left wrist with pocket knife.

## 2014-12-20 ENCOUNTER — Encounter (HOSPITAL_COMMUNITY): Payer: Self-pay | Admitting: *Deleted

## 2014-12-20 ENCOUNTER — Emergency Department (HOSPITAL_COMMUNITY)
Admission: EM | Admit: 2014-12-20 | Discharge: 2014-12-22 | Disposition: A | Discharge status: Other Acute Inpatient Hospital | Payer: Medicaid Other | Attending: Emergency Medicine | Admitting: Emergency Medicine

## 2014-12-20 DIAGNOSIS — S61512A Laceration without foreign body of left wrist, initial encounter: Secondary | ICD-10-CM | POA: Diagnosis not present

## 2014-12-20 DIAGNOSIS — G40909 Epilepsy, unspecified, not intractable, without status epilepticus: Secondary | ICD-10-CM | POA: Diagnosis not present

## 2014-12-20 DIAGNOSIS — Z720 Tobacco use: Secondary | ICD-10-CM | POA: Diagnosis not present

## 2014-12-20 DIAGNOSIS — Y9389 Activity, other specified: Secondary | ICD-10-CM | POA: Insufficient documentation

## 2014-12-20 DIAGNOSIS — Y998 Other external cause status: Secondary | ICD-10-CM | POA: Insufficient documentation

## 2014-12-20 DIAGNOSIS — F1910 Other psychoactive substance abuse, uncomplicated: Secondary | ICD-10-CM | POA: Diagnosis not present

## 2014-12-20 DIAGNOSIS — X788XXA Intentional self-harm by other sharp object, initial encounter: Secondary | ICD-10-CM | POA: Diagnosis not present

## 2014-12-20 DIAGNOSIS — F332 Major depressive disorder, recurrent severe without psychotic features: Secondary | ICD-10-CM

## 2014-12-20 DIAGNOSIS — Z8719 Personal history of other diseases of the digestive system: Secondary | ICD-10-CM | POA: Diagnosis not present

## 2014-12-20 DIAGNOSIS — Y9289 Other specified places as the place of occurrence of the external cause: Secondary | ICD-10-CM | POA: Diagnosis not present

## 2014-12-20 DIAGNOSIS — Z79899 Other long term (current) drug therapy: Secondary | ICD-10-CM | POA: Insufficient documentation

## 2014-12-20 DIAGNOSIS — Z23 Encounter for immunization: Secondary | ICD-10-CM | POA: Insufficient documentation

## 2014-12-20 DIAGNOSIS — R45851 Suicidal ideations: Secondary | ICD-10-CM | POA: Insufficient documentation

## 2014-12-20 DIAGNOSIS — F1320 Sedative, hypnotic or anxiolytic dependence, uncomplicated: Secondary | ICD-10-CM

## 2014-12-20 HISTORY — DX: Major depressive disorder, single episode, unspecified: F32.9

## 2014-12-20 HISTORY — DX: Depression, unspecified: F32.A

## 2014-12-20 LAB — URINALYSIS, ROUTINE W REFLEX MICROSCOPIC
Bilirubin Urine: NEGATIVE
Glucose, UA: NEGATIVE mg/dL
Hgb urine dipstick: NEGATIVE
Ketones, ur: NEGATIVE mg/dL
LEUKOCYTES UA: NEGATIVE
Nitrite: NEGATIVE
PH: 6.5 (ref 5.0–8.0)
PROTEIN: NEGATIVE mg/dL
Specific Gravity, Urine: 1.005 (ref 1.005–1.030)
Urobilinogen, UA: 0.2 mg/dL (ref 0.0–1.0)

## 2014-12-20 LAB — ETHANOL: Alcohol, Ethyl (B): 229 mg/dL — ABNORMAL HIGH (ref <5)

## 2014-12-20 LAB — CBC WITH DIFFERENTIAL/PLATELET
Basophils Absolute: 0.1 10*3/uL (ref 0.0–0.1)
Basophils Relative: 1 % (ref 0–1)
Eosinophils Absolute: 0.2 10*3/uL (ref 0.0–0.7)
Eosinophils Relative: 3 % (ref 0–5)
HEMATOCRIT: 44.4 % (ref 39.0–52.0)
HEMOGLOBIN: 15 g/dL (ref 13.0–17.0)
LYMPHS ABS: 2.6 10*3/uL (ref 0.7–4.0)
Lymphocytes Relative: 30 % (ref 12–46)
MCH: 31.8 pg (ref 26.0–34.0)
MCHC: 33.8 g/dL (ref 30.0–36.0)
MCV: 94.3 fL (ref 78.0–100.0)
Monocytes Absolute: 0.5 10*3/uL (ref 0.1–1.0)
Monocytes Relative: 6 % (ref 3–12)
NEUTROS PCT: 60 % (ref 43–77)
Neutro Abs: 5.2 10*3/uL (ref 1.7–7.7)
Platelets: 254 10*3/uL (ref 150–400)
RBC: 4.71 MIL/uL (ref 4.22–5.81)
RDW: 13 % (ref 11.5–15.5)
WBC: 8.6 10*3/uL (ref 4.0–10.5)

## 2014-12-20 LAB — RAPID URINE DRUG SCREEN, HOSP PERFORMED
AMPHETAMINES: NOT DETECTED
Barbiturates: NOT DETECTED
Benzodiazepines: POSITIVE — AB
Cocaine: NOT DETECTED
OPIATES: NOT DETECTED
Tetrahydrocannabinol: NOT DETECTED

## 2014-12-20 LAB — BASIC METABOLIC PANEL
Anion gap: 8 (ref 5–15)
BUN: 9 mg/dL (ref 6–20)
CO2: 27 mmol/L (ref 22–32)
Calcium: 8.7 mg/dL — ABNORMAL LOW (ref 8.9–10.3)
Chloride: 106 mmol/L (ref 101–111)
Creatinine, Ser: 1.01 mg/dL (ref 0.61–1.24)
GFR calc Af Amer: >60 mL/min (ref >60)
GLUCOSE: 98 mg/dL (ref 70–99)
POTASSIUM: 3.8 mmol/L (ref 3.5–5.1)
Sodium: 141 mmol/L (ref 135–145)

## 2014-12-20 MED ORDER — ONDANSETRON HCL 4 MG PO TABS: 4.0000 mg | ORAL_TABLET | Freq: Three times a day (TID) | ORAL | Status: DC | PRN

## 2014-12-20 MED ORDER — ALUM & MAG HYDROXIDE-SIMETH 200-200-20 MG/5ML PO SUSP
30.0000 mL | ORAL | Status: DC | PRN
  Administered 2014-12-22: 30 mL via ORAL
  Filled 2014-12-20: qty 30

## 2014-12-20 MED ORDER — IBUPROFEN 800 MG PO TABS
800.0000 mg | ORAL_TABLET | Freq: Once | ORAL | Status: AC
  Administered 2014-12-20: 800 mg via ORAL
  Filled 2014-12-20: qty 1

## 2014-12-20 MED ORDER — LORAZEPAM 1 MG PO TABS
0.0000 mg | ORAL_TABLET | Freq: Four times a day (QID) | ORAL | Status: DC
  Administered 2014-12-20 – 2014-12-22 (×7): 1 mg via ORAL
  Filled 2014-12-20 (×7): qty 1

## 2014-12-20 MED ORDER — TETANUS-DIPHTH-ACELL PERTUSSIS 5-2.5-18.5 LF-MCG/0.5 IM SUSP
0.5000 mL | Freq: Once | INTRAMUSCULAR | Status: AC
  Administered 2014-12-20: 0.5 mL via INTRAMUSCULAR
  Filled 2014-12-20: qty 0.5

## 2014-12-20 MED ORDER — IBUPROFEN 200 MG PO TABS
600.0000 mg | ORAL_TABLET | Freq: Three times a day (TID) | ORAL | Status: DC | PRN
  Administered 2014-12-21: 600 mg via ORAL
  Filled 2014-12-20 (×2): qty 3

## 2014-12-20 MED ORDER — LIDOCAINE-EPINEPHRINE (PF) 2 %-1:200000 IJ SOLN
20.0000 mL | Freq: Once | INTRAMUSCULAR | Status: AC
  Administered 2014-12-20: 20 mL via INTRADERMAL
  Filled 2014-12-20: qty 20

## 2014-12-20 MED ORDER — LORAZEPAM 1 MG PO TABS: 0.0000 mg | ORAL_TABLET | Freq: Two times a day (BID) | ORAL | Status: DC

## 2014-12-20 MED ORDER — NICOTINE 21 MG/24HR TD PT24
21.0000 mg | MEDICATED_PATCH | Freq: Every day | TRANSDERMAL | Status: DC
  Administered 2014-12-21: 21 mg via TRANSDERMAL
  Filled 2014-12-20 (×2): qty 1

## 2014-12-20 MED ORDER — ZOLPIDEM TARTRATE 5 MG PO TABS
5.0000 mg | ORAL_TABLET | Freq: Every evening | ORAL | Status: DC | PRN
  Administered 2014-12-21: 5 mg via ORAL
  Filled 2014-12-20: qty 1

## 2014-12-20 NOTE — ED Notes (Signed)
PA at bedside, undressed pts left wrist. Pt moved into private room, suture cart at bedside. [employer] and dad still at bedside.

## 2014-12-20 NOTE — Progress Notes (Signed)
CSW was notified by Nurse CM that the pt may have possible issues with shelter.  CSW met with pt at bedside. Nurse was present.  Patient stated during interview " If I was dead. Id be better, but im still alive." Once CSW asked if he was feeling suicidal currently the pt stated " I cant answer that." However, he denies being homicidal.   Patient confirms that he is homeless. He states that he lives in the [place]. Patient has a gash on his left arm. He states he cut himself with a razor. CSW gave the pt resources for homelessness and food pantries.  Patient reports that he has been diagnosed in the past with schizoaffective disorder, bi-polar and ADHD.  Willette Brace [phone] ED CSW 12/20/2014 11:35 PM

## 2014-12-20 NOTE — ED Notes (Signed)
Pt states he cut his L wrist w/ a razor blade, states he was in woods bleeding for about an hour before EMS arrived and found him, pt states he is depressed and having SI d/t his hx of epilepsy, states he can't get a job or a G[employer] license d/t history, states he hasn't had a seizure in 2 1/2 years, states he does take his medication (depakote), pt states he also hasn't seen his brother in 3 years d/t his wife not liking him, states he misses his brother, pt states he drank 7 beers before cutting his wrist.

## 2014-12-20 NOTE — ED Notes (Signed)
[facility] Expected date:  Expected time:  Means of arrival:  Comments: EMS 44 yo male with SI/cut left wrist with box cutter
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
## 2014-12-20 NOTE — ED Notes (Signed)
Bed: [facility] Expected date:  Expected time:  Means of arrival:  Comments: 
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
## 2014-12-20 NOTE — ED Notes (Signed)
Pt corporately changed into hospital scrubs, pt belongings placed in one pt belonging bag. Security called to wand pt and belongings.

## 2014-12-20 NOTE — ED Notes (Signed)
Pt again to the restroom refusing to give urine sample. Pt is up in hallways pacing security, [employer] and pts father at bedside.

## 2014-12-20 NOTE — ED Notes (Signed)
Pt changed into scrubs.  

## 2014-12-20 NOTE — ED Notes (Signed)
Pt wanded by security. 

## 2014-12-20 NOTE — ED Notes (Signed)
Ken-[phone]  Patt-Mom- [phone]  Call if need to

## 2014-12-20 NOTE — ED Notes (Signed)
Pt found smoking in the restroom.
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
## 2014-12-20 NOTE — ED Notes (Signed)
Pt ambulated to the restroom on arrival, urinated however refused to provide sample. Process explained to pt, however pt states he needs to make some phone calls prior to changing into scrubs.

## 2014-12-21 ENCOUNTER — Encounter (HOSPITAL_COMMUNITY): Payer: Self-pay | Admitting: *Deleted

## 2014-12-21 MED ORDER — OXYCODONE-ACETAMINOPHEN 7.5-325 MG PO TABS: 1.0000 | ORAL_TABLET | Freq: Four times a day (QID) | ORAL | Status: DC | PRN

## 2014-12-21 MED ORDER — HYDROCODONE-ACETAMINOPHEN 5-325 MG PO TABS
1.0000 | ORAL_TABLET | Freq: Once | ORAL | Status: AC
  Administered 2014-12-21: 1 via ORAL
  Filled 2014-12-21: qty 1

## 2014-12-21 MED ORDER — OXYCODONE-ACETAMINOPHEN 5-325 MG PO TABS
1.0000 | ORAL_TABLET | Freq: Four times a day (QID) | ORAL | Status: DC | PRN
  Administered 2014-12-21 – 2014-12-22 (×4): 1 via ORAL
  Filled 2014-12-21 (×4): qty 1

## 2014-12-21 MED ORDER — DIVALPROEX SODIUM ER 500 MG PO TB24
500.0000 mg | ORAL_TABLET | Freq: Two times a day (BID) | ORAL | Status: DC
  Administered 2014-12-22 (×3): 500 mg via ORAL
  Filled 2014-12-21 (×4): qty 1

## 2014-12-21 MED ORDER — OXYCODONE HCL 5 MG PO TABS
2.5000 mg | ORAL_TABLET | Freq: Four times a day (QID) | ORAL | Status: DC | PRN
  Administered 2014-12-21 – 2014-12-22 (×4): 2.5 mg via ORAL
  Filled 2014-12-21 (×5): qty 1

## 2014-12-21 NOTE — ED Notes (Signed)
On the phone 

## 2014-12-21 NOTE — Consult Note (Signed)
  Attempted a second time to evaluate patient but was asked to leave by patient.  His father was visiting him and patient stated that he will be ready after his father leaves.  This writer left the room. Dahlia ByesJosephine Onuoha, PMHNP-BC Patient seen face-to-face for psychiatric evaluation, chart reviewed and case discussed with the physician extender and developed treatment plan. Reviewed the information documented and agree with the treatment plan. Thedore MinsMojeed Amandeep Nesmith, MD

## 2014-12-21 NOTE — ED Notes (Signed)
Pt's dad into see 

## 2014-12-21 NOTE — Progress Notes (Addendum)
[facility] tonight, [facility].  Patient has been [facility] for IP [facility] to the following [facility]: [facility], [facility], [facility], and [facility].  [facility] for review. Per WilloughbyMarge, referral received. [facility] - per Nicholos JohnsKathleen, can't take adult medicaid. "Will have a little bit of everything tomorrow". [facility] - "We are not a provider for medicaid, per Pat. Will have d/c tomorrow. No beds tonight. [facility] - per Jonny RuizJohn, have adult male beds, fax referral. Also, have beds on all units, and take adult medicaid. Per Jonny RuizJohn, referral received. [facility] - Per April, no adult beds tonight, only 1 male geriatric bed.  At capacity tonight: [facility] - per Sinou, fax it tomorrow in am, will have d/c in afternoon. Tonight at capacity. [facility]  CSW will continue to seek placement.  Melbourne Abtsatia Bram Hottel, LCSWA Disposition staff 12/21/2014 9:16 PM

## 2014-12-21 NOTE — ED Notes (Signed)
Splint reapplied.

## 2014-12-21 NOTE — ED Notes (Signed)
Dr [place] into see 

## 2014-12-21 NOTE — ED Notes (Signed)
Laying on the bed watching tv, appears more comfortable, but reports no relief

## 2014-12-21 NOTE — ED Notes (Signed)
Pr reports that he had not movement/sensation in his lt thumb, decreased sensation and movement 1-3, no movement 5th finger w/ decreased sensation.  Splint/dressing intact.

## 2014-12-21 NOTE — ED Notes (Signed)
NAd, talking calmly w/ his father.

## 2014-12-21 NOTE — ED Notes (Signed)
Up to the bathroom 

## 2014-12-21 NOTE — ED Notes (Signed)
Dr Aida RaiderA and Josephine NP into see.  When Dr A began asking the patient questions the pt began asking questions about  Pain control for his wrist.  Pt became angry at Dr and began cursing at Dr A. "I'm thru talking to you."

## 2014-12-21 NOTE — ED Notes (Signed)
Patient appears pleasant, cooperative. Reports SI. Reports "outbursts that feel real" with voices, noises and VH at times. Denies HI. States anxiety is normally high with racing thoughts and poor sleep. States appetite fluctuates. Reports going 2 - 4 days without wanting to eat or not eating. Rates feelings of depression 8/10. Reports "blackout" approximately 2 weeks.  Encouragement offered. Given meal.   Q 15 safety checks in place.

## 2014-12-21 NOTE — BH Assessment (Signed)
Tele Assessment Note   Russell SessionsChristopher [name] is a 44 y.o. male who presents via IVC petition, initiated by his mother.  Pt currently denies SI/HI to this Clinical research associatewriter, stating that he was SI [facility]  Pt denies stressors or precip events causing his mental status, but told medical staff that he has (1)problems finding employment,(2) financial issues,(3) he hasn't seen his brother in 3 yrs because his brother's wife doesn't like him, (4) increased anxiety and racing thoughts.  Pt was found in the woods after he cut his left arm with a box cutter blade--2" horizontal laceration to his left arm.  Pt has been in woods bleeding for approx 1 hr before EMS located him.   Pt says he is depressed and reported SI thoughts due to his hx of epilepsy and says he can't get a job or drive a car because he has epilepsy.  He states he has not had a seizure on 2.5 yrs.  Pt says prior to cutting his arm he drank 5-7 12oz beers. BAL resulted at [id number].  Pt says he has poor appetite, going 2-4 days w/o eating. Pt told this Clinical research associatewriter, he is homeless. Pt admitted that he planned to cut his wrist, per mother's statement.      Axis I: Schizoaffective disorder, Bipolar type; Alcohol Use D/O  Axis II: Deferred Axis III:  Past Medical History  Diagnosis Date  . Hernia   . Bipolar 1 disorder   . Epilepsy   . Schizophrenia   . Depression    Axis IV: economic problems, housing problems, occupational problems, other psychosocial or environmental problems, problems related to social environment and problems with primary support group Axis V: 31-40 impairment in reality testing  Past Medical History:  Past Medical History  Diagnosis Date  . Hernia   . Bipolar 1 disorder   . Epilepsy   . Schizophrenia   . Depression     Past Surgical History  Procedure Laterality Date  . Ankle fracture surgery  1994 - approximate  . Ligament repair  1998    right arm    Family History: No family history on  file.  Social History:  reports that he has been smoking Cigarettes.  He has never used smokeless tobacco. He reports that he drinks alcohol. He reports that he does not use illicit drugs.  Additional Social History:  Alcohol / Drug Use Pain Medications: See MAR  Prescriptions: See MAR  Over the Counter: See MAR  History of alcohol / drug use?: Yes Longest period of sobriety (when/how long): None  Withdrawal Symptoms: Other (Comment) (No w/d sxs ) Substance #1 Name of Substance 1: Alcohol  1 - Age of First Use: Teens  1 - Amount (size/oz): 5-6 12oz 1 - Frequency: Daily  1 - Duration: On-going  1 - Last Use / Amount: 12/21/14  CIWA: CIWA-Ar BP: 107/56 mmHg Pulse Rate: 66 Nausea and Vomiting: no nausea and no vomiting Tactile Disturbances: very mild itching, pins and needles, burning or numbness Tremor: not visible, but can be felt fingertip to fingertip Auditory Disturbances: not present Paroxysmal Sweats: no sweat visible Visual Disturbances: very mild sensitivity Anxiety: mildly anxious Headache, Fullness in Head: very mild Agitation: normal activity Orientation and Clouding of Sensorium: oriented and can do serial additions CIWA-Ar Total: 5 COWS:    PATIENT STRENGTHS: (choose at least two) Supportive family/friends  Allergies:  Allergies  Allergen Reactions  . Ultram [Tramadol Hcl] Hives    Home Medications:  (  Not in a hospital admission)  OB/GYN Status:  No LMP for male patient.  General Assessment Data Location of Assessment: [facility] Is this a Tele or Face-to-Face Assessment?: Tele Assessment Is this an Initial Assessment or a Re-assessment for this encounter?: Initial Assessment Marital status: Single Maiden name: None  Is patient pregnant?: No Pregnancy Status: No Living Arrangements: Spouse/significant other Can pt return to current living arrangement?: Yes Admission Status: Involuntary Is patient capable of signing voluntary admission?: No Referral  Source: MD Insurance type: MCD  Medical Screening Exam [facility] Walk-in ONLY) Medical Exam completed: No Reason for MSE not completed: Other: (None )  Crisis Care Plan Living Arrangements: Spouse/significant other Name of Psychiatrist: None  Name of Therapist: None   Education Status Is patient currently in school?: No Current Grade: None  Highest grade of school patient has completed: None  Name of school: None  Contact person: None   Risk to self with the past 6 months Suicidal Ideation: No-Not Currently/Within Last 6 Months Has patient been a risk to self within the past 6 months prior to admission? : Yes Suicidal Intent: No-Not Currently/Within Last 6 Months Has patient had any suicidal intent within the past 6 months prior to admission? : Yes Is patient at risk for suicide?: Yes Suicidal Plan?: Yes-Currently Present (Pt cut is arm with a box cutter ) Has patient had any suicidal plan within the past 6 months prior to admission? : Yes Specify Current Suicidal Plan: Pt denies current plan at this time  Access to Means: Yes Specify Access to Suicidal Means: Sharps, Pills  What has been your use of drugs/alcohol within the last 12 months?: Abusing: alcohol  Previous Attempts/Gestures: No How many times?: 0 Other Self Harm Risks: None  Triggers for Past Attempts: None known Intentional Self Injurious Behavior: None Family Suicide History: No Recent stressful life event(s): Other (Comment), Job Loss, Financial Problems (Increased anxiety/racing thoughts; poor relationship w/broth) Persecutory voices/beliefs?: No Depression: Yes Depression Symptoms: Loss of interest in usual pleasures, Feeling worthless/self pity, Insomnia, Isolating Substance abuse history and/or treatment for substance abuse?: Yes Suicide prevention information given to non-admitted patients: Not applicable  Risk to Others within the past 6 months Homicidal Ideation: No Does patient have any lifetime risk of  violence toward others beyond the six months prior to admission? : No Thoughts of Harm to Others: No Current Homicidal Intent: No Current Homicidal Plan: No Access to Homicidal Means: No Identified Victim: None  History of harm to others?: No Assessment of Violence: None Noted Violent Behavior Description: None  Does patient have access to weapons?: No Criminal Charges Pending?: Yes Describe Pending Criminal Charges: Probation violation for larceny charge  Does patient have a court date: Yes Court Date:  (June 2016) Is patient on probation?: Yes  Psychosis Hallucinations: None noted Delusions: None noted  Mental Status Report Appearance/Hygiene: Disheveled, In scrubs Eye Contact: Fair Motor Activity: Unremarkable Speech: Slow, Slurred Level of Consciousness: Drowsy (Pt is intoxicated ) Mood: Other (Comment) (Appropriate ) Affect: Appropriate to circumstance Anxiety Level: None Thought Processes: Coherent, Relevant Judgement: Impaired Orientation: Person, Place, Time, Situation Obsessive Compulsive Thoughts/Behaviors: None  Cognitive Functioning Concentration: Decreased Memory: Recent Intact, Remote Intact IQ: Average Insight: Poor Impulse Control: Poor Appetite: Poor Weight Loss:  (Unk ) Weight Gain: 0 Sleep: Decreased Total Hours of Sleep:  (Unk ) Vegetative Symptoms: None  ADLScreening [facility] Assessment Services) Patient's cognitive ability adequate to safely complete daily activities?: Yes Patient able to express need for assistance with ADLs?:  No Independently performs ADLs?: Yes (appropriate for developmental age)  Prior Inpatient Therapy Prior Inpatient Therapy: Yes Prior Therapy Dates: 2015,2016 Prior Therapy Facilty/Provider([facility]  Reason for Treatment: Si/depression   Prior Outpatient Therapy Prior Outpatient Therapy: No Prior Therapy Dates: None  Prior Therapy Facilty/Provider(s): None  Reason for Treatment: None  Does patient have an ACCT  team?: No Does patient have Intensive In-House Services?  : No Does patient have [facility] services? : No Does patient have P4CC services?: No  ADL Screening (condition at time of admission) Patient's cognitive ability adequate to safely complete daily activities?: Yes Is the patient deaf or have difficulty hearing?: No Does the patient have difficulty seeing, even when wearing glasses/contacts?: No Does the patient have difficulty concentrating, remembering, or making decisions?: Yes Patient able to express need for assistance with ADLs?: No Does the patient have difficulty dressing or bathing?: No Independently performs ADLs?: Yes (appropriate for developmental age) Does the patient have difficulty walking or climbing stairs?: No Weakness of Legs: None Weakness of Arms/Hands: None  Home Assistive Devices/Equipment Home Assistive Devices/Equipment: None  Therapy Consults (therapy consults require a physician order) PT Evaluation Needed: No OT Evalulation Needed: No SLP Evaluation Needed: No Abuse/Neglect Assessment (Assessment to be complete while patient is alone) Physical Abuse: Denies Verbal Abuse: Denies Sexual Abuse: Denies Exploitation of patient/patient's resources: Denies Self-Neglect: Denies Values / Beliefs Cultural Requests During Hospitalization: None Spiritual Requests During Hospitalization: None Consults Spiritual Care Consult Needed: No Social Work Consult Needed: No Merchant navy officer (For Healthcare) Does patient have an advance directive?: No Would patient like information on creating an advanced directive?: No - patient declined information    Additional Information 1:1 In Past 12 Months?: No CIRT Risk: No Elopement Risk: No Does patient have medical clearance?: Yes     Disposition:  Disposition Initial Assessment Completed for this Encounter: Yes Disposition of Patient: Inpatient treatment program, Referred to (Per Donell Sievert, PA meets  criteria for inpt admit ) Type of inpatient treatment program: Adult (Per Donell Sievert, PA pt meets criteria for inpt admission ) Patient referred to: Other (Comment) (Per Donell Sievert, PA meets criteria for inpt admission )  Murrell Redden 12/21/2014 3:55 AM

## 2014-12-22 DIAGNOSIS — X789XXA Intentional self-harm by unspecified sharp object, initial encounter: Secondary | ICD-10-CM | POA: Insufficient documentation

## 2014-12-22 DIAGNOSIS — R45851 Suicidal ideations: Secondary | ICD-10-CM

## 2014-12-22 DIAGNOSIS — S61512A Laceration without foreign body of left wrist, initial encounter: Secondary | ICD-10-CM

## 2014-12-22 DIAGNOSIS — F332 Major depressive disorder, recurrent severe without psychotic features: Secondary | ICD-10-CM

## 2014-12-22 DIAGNOSIS — S61519A Laceration without foreign body of unspecified wrist, initial encounter: Secondary | ICD-10-CM | POA: Insufficient documentation

## 2014-12-22 DIAGNOSIS — F1910 Other psychoactive substance abuse, uncomplicated: Secondary | ICD-10-CM

## 2014-12-22 LAB — VALPROIC ACID LEVEL: Valproic Acid Lvl: 31 ug/mL — ABNORMAL LOW (ref 50.0–100.0)

## 2014-12-22 MED ORDER — NICOTINE 21 MG/24HR TD PT24
21.0000 mg | MEDICATED_PATCH | Freq: Once | TRANSDERMAL | Status: DC
  Administered 2014-12-22: 21 mg via TRANSDERMAL
  Filled 2014-12-22: qty 1

## 2014-12-22 MED ORDER — OXYCODONE HCL 5 MG PO TABS
2.5000 mg | ORAL_TABLET | Freq: Four times a day (QID) | ORAL | Status: DC | PRN
Start: 2014-12-22 — End: 2014-12-22
  Filled 2014-12-22: qty 1

## 2014-12-22 MED ORDER — OXYCODONE-ACETAMINOPHEN 5-325 MG PO TABS
2.0000 | ORAL_TABLET | Freq: Four times a day (QID) | ORAL | Status: DC | PRN
  Administered 2014-12-22 (×2): 2 via ORAL
  Filled 2014-12-22: qty 2

## 2014-12-22 MED ORDER — OXYCODONE-ACETAMINOPHEN 5-325 MG PO TABS
2.0000 | ORAL_TABLET | Freq: Four times a day (QID) | ORAL | Status: DC | PRN
  Filled 2014-12-22: qty 2

## 2014-12-22 NOTE — [facility] Counselor (Signed)
Pt[facility]. Accepting physician is Dr. Ardyth HarpsHernandez. Attending Physician will be Dr.Hernandez.   Pt. has [facility] room 311A[facility] Charge Nurse Juanna CaoPhyllis C.  Call report to [phone].  [facility] ER Staff(Toyka) is aware of the admission.

## 2014-12-22 NOTE — Consult Note (Signed)
[facility] Face-to-Face Psychiatry Consult   Reason for Consult:  Suicidal ideations Referring Physician:  EDP Patient Identification: Russell [name] MRN:  [id number] Principal Diagnosis: MDD (major depressive disorder), recurrent episode, severe Diagnosis:   Patient Active Problem List   Diagnosis Date Noted  . MDD (major depressive disorder), recurrent episode, severe [F33.2] 09/09/2014    Priority: High  . Opiate dependence [F11.20] 09/04/2014    Priority: High  . Benzodiazepine dependence [F13.20] 04/21/2014    Priority: High  . Suicidal ideation [R45.851] 04/19/2014    Priority: High  Review of Systems  Constitutional: Negative.   HENT: Negative.   Eyes: Negative.   Respiratory: Negative.   Cardiovascular: Negative.   Gastrointestinal: Negative.   Genitourinary: Negative.   Musculoskeletal:       Arm pain from laceration  Skin: Negative.   Neurological: Negative.   Endo/Heme/Allergies: Negative.   Psychiatric/Behavioral: Positive for depression, suicidal ideas and substance abuse.     Total Time spent with patient: 45 minutes  Subjective:   Russell [name] is a 44 y.o. male patient admitted with suicide attempt.  HPI:  The patient continues to endorse suicidal ideations after severely cutting his arm in a suicide attempt.  He was intoxicated with alcohol and is currently on Ativan detox protocol.  He denies homicidal ideations and alcohol abuse.  Russell [name] has a past history of opiate abuse and current benzodiazepine abuse. HPI Elements:   Location:  generalized. Quality:  acute. Severity:  severe. Timing:  constant. Duration:  few days. Context:  stressors.  Past Medical History:  Past Medical History  Diagnosis Date  . Hernia   . Bipolar 1 disorder   . Epilepsy   . Schizophrenia   . Depression     Past Surgical History  Procedure Laterality Date  . Ankle fracture surgery  1994 - approximate  . Ligament repair  1998    right arm   Family History:  No family history on file. Social History:  History  Alcohol Use  . Yes    Comment: socially     History  Drug Use No    History   Social History  . Marital Status: Single    Spouse Name: N/A  . Number of Children: N/A  . Years of Education: N/A   Social History Main Topics  . Smoking status: Current Some Day Smoker    Types: Cigarettes  . Smokeless tobacco: Never Used  . Alcohol Use: Yes     Comment: socially  . Drug Use: No  . Sexual Activity: Not on file   Other Topics Concern  . None   Social History Narrative   Additional Social History:    Pain Medications: See MAR  Prescriptions: See MAR  Over the Counter: See MAR  History of alcohol / drug use?: Yes Longest period of sobriety (when/how long): None  Withdrawal Symptoms: Other (Comment) (No w/d sxs ) Name of Substance 1: Alcohol  1 - Age of First Use: Teens  1 - Amount (size/oz): 5-6 12oz 1 - Frequency: Daily  1 - Duration: On-going  1 - Last Use / Amount: 12/21/14                   Allergies:   Allergies  Allergen Reactions  . Ultram [Tramadol Hcl] Hives    Labs:  Results for orders placed or performed during the hospital encounter of 12/20/14 (from the past 48 hour(s))  Basic metabolic panel     Status: Abnormal   Collection  Time: 12/20/14  8:57 PM  Result Value Ref Range   Sodium 141 135 - 145 mmol/L   Potassium 3.8 3.5 - 5.1 mmol/L   Chloride 106 101 - 111 mmol/L   CO2 27 22 - 32 mmol/L   Glucose, Bld 98 70 - 99 mg/dL   BUN 9 6 - 20 mg/dL   Creatinine, Ser 1.01 0.61 - 1.24 mg/dL   Calcium 8.7 (L) 8.9 - 10.3 mg/dL   GFR calc non Af Amer >60 >60 mL/min   GFR calc Af Amer >60 >60 mL/min    Comment: (NOTE) The eGFR has been calculated using the CKD EPI equation. This calculation has not been validated in all clinical situations. eGFR's persistently <90 mL/min signify possible Chronic Kidney Disease.    Anion gap 8 5 - 15  Ethanol     Status: Abnormal   Collection Time: 12/20/14   8:57 PM  Result Value Ref Range   Alcohol, Ethyl (B) 229 (H) <5 mg/dL    Comment:        LOWEST DETECTABLE LIMIT FOR SERUM ALCOHOL IS 11 mg/dL FOR MEDICAL PURPOSES ONLY   CBC with Differential     Status: None   Collection Time: 12/20/14  8:57 PM  Result Value Ref Range   WBC 8.6 4.0 - 10.5 K/uL   RBC 4.71 4.22 - 5.81 MIL/uL   Hemoglobin 15.0 13.0 - 17.0 g/dL   HCT 44.4 39.0 - 52.0 %   MCV 94.3 78.0 - 100.0 fL   MCH 31.8 26.0 - 34.0 pg   MCHC 33.8 30.0 - 36.0 g/dL   RDW 13.0 11.5 - 15.5 %   Platelets 254 150 - 400 K/uL   Neutrophils Relative % 60 43 - 77 %   Neutro Abs 5.2 1.7 - 7.7 K/uL   Lymphocytes Relative 30 12 - 46 %   Lymphs Abs 2.6 0.7 - 4.0 K/uL   Monocytes Relative 6 3 - 12 %   Monocytes Absolute 0.5 0.1 - 1.0 K/uL   Eosinophils Relative 3 0 - 5 %   Eosinophils Absolute 0.2 0.0 - 0.7 K/uL   Basophils Relative 1 0 - 1 %   Basophils Absolute 0.1 0.0 - 0.1 K/uL  Urinalysis, Routine w reflex microscopic     Status: None   Collection Time: 12/20/14  9:17 PM  Result Value Ref Range   Color, Urine YELLOW YELLOW   APPearance CLEAR CLEAR   Specific Gravity, Urine 1.005 1.005 - 1.030   pH 6.5 5.0 - 8.0   Glucose, UA NEGATIVE NEGATIVE mg/dL   Hgb urine dipstick NEGATIVE NEGATIVE   Bilirubin Urine NEGATIVE NEGATIVE   Ketones, ur NEGATIVE NEGATIVE mg/dL   Protein, ur NEGATIVE NEGATIVE mg/dL   Urobilinogen, UA 0.2 0.0 - 1.0 mg/dL   Nitrite NEGATIVE NEGATIVE   Leukocytes, UA NEGATIVE NEGATIVE    Comment: MICROSCOPIC NOT DONE ON URINES WITH NEGATIVE PROTEIN, BLOOD, LEUKOCYTES, NITRITE, OR GLUCOSE <1000 mg/dL.  Urine rapid drug screen (hosp performed)     Status: Abnormal   Collection Time: 12/20/14  9:17 PM  Result Value Ref Range   Opiates NONE DETECTED NONE DETECTED   Cocaine NONE DETECTED NONE DETECTED   Benzodiazepines POSITIVE (A) NONE DETECTED   Amphetamines NONE DETECTED NONE DETECTED   Tetrahydrocannabinol NONE DETECTED NONE DETECTED   Barbiturates NONE  DETECTED NONE DETECTED    Comment:        DRUG SCREEN FOR MEDICAL PURPOSES ONLY.  IF CONFIRMATION IS NEEDED FOR ANY PURPOSE,  NOTIFY LAB WITHIN 5 DAYS.        LOWEST DETECTABLE LIMITS FOR URINE DRUG SCREEN Drug Class       Cutoff (ng/mL) Amphetamine      1000 Barbiturate      200 Benzodiazepine   485 Tricyclics       462 Opiates          300 Cocaine          300 THC              50   Valproic acid level     Status: Abnormal   Collection Time: 12/22/14  9:00 AM  Result Value Ref Range   Valproic Acid Lvl 31 (L) 50.0 - 100.0 ug/mL    Comment: Performed at [place]: Blood pressure 103/60, pulse 67, temperature 98 F (36.7 C), temperature source Oral, resp. rate 16, SpO2 98 %.  Risk to Self: Suicidal Ideation: No-Not Currently/Within Last 6 Months Suicidal Intent: No-Not Currently/Within Last 6 Months Is patient at risk for suicide?: Yes Suicidal Plan?: Yes-Currently Present (Pt cut is arm with a box cutter ) Specify Current Suicidal Plan: Pt denies current plan at this time  Access to Means: Yes Specify Access to Suicidal Means: Sharps, Pills  What has been your use of drugs/alcohol within the last 12 months?: Abusing: alcohol  How many times?: 0 Other Self Harm Risks: None  Triggers for Past Attempts: None known Intentional Self Injurious Behavior: None Risk to Others: Homicidal Ideation: No Thoughts of Harm to Others: No Current Homicidal Intent: No Current Homicidal Plan: No Access to Homicidal Means: No Identified Victim: None  History of harm to others?: No Assessment of Violence: None Noted Violent Behavior Description: None  Does patient have access to weapons?: No Criminal Charges Pending?: Yes Describe Pending Criminal Charges: Probation violation for larceny charge  Does patient have a court date: Yes Court Date:  (June 2016) Prior Inpatient Therapy: Prior Inpatient Therapy: Yes Prior Therapy Dates: 2015,2016 Prior Therapy  Facilty/Provider(s): [facility]  Reason for Treatment: Si/depression  Prior Outpatient Therapy: Prior Outpatient Therapy: No Prior Therapy Dates: None  Prior Therapy Facilty/Provider(s): None  Reason for Treatment: None  Does patient have an ACCT team?: No Does patient have Intensive In-House Services?  : No Does patient have [facility] services? : No Does patient have P4CC services?: No  Current Facility-Administered Medications  Medication Dose Route Frequency Provider Last Rate Last Dose  . alum & mag hydroxide-simeth (MAALOX/MYLANTA) 200-200-20 MG/5ML suspension 30 mL  30 mL Oral PRN Hessie Diener Hess, PA-C   30 mL at 12/22/14 1111  . divalproex (DEPAKOTE ER) 24 hr tablet 500 mg  500 mg Oral BID WC Harriet Butte, NP   500 mg at 12/22/14 0749  . ibuprofen (ADVIL,MOTRIN) tablet 600 mg  600 mg Oral Q8H PRN Carman Ching, PA-C   600 mg at 12/21/14 0608  . LORazepam (ATIVAN) tablet 0-4 mg  0-4 mg Oral 4 times per day Carman Ching, PA-C   1 mg at 12/22/14 1108   Followed by  . [START ON 12/23/2014] LORazepam (ATIVAN) tablet 0-4 mg  0-4 mg Oral Q12H Robyn M Hess, PA-C      . nicotine (NICODERM CQ - dosed in mg/24 hours) patch 21 mg  21 mg Transdermal Daily Carman Ching, PA-C   Stopped at 12/22/14 1021  . ondansetron (ZOFRAN) tablet 4 mg  4 mg Oral Q8H PRN Carman Ching, PA-C      .  oxyCODONE-acetaminophen (PERCOCET/ROXICET) 5-325 MG per tablet 1 tablet  1 tablet Oral Q6H PRN Varney Biles, MD   1 tablet at 12/22/14 0749   And  . oxyCODONE (Oxy IR/ROXICODONE) immediate release tablet 2.5 mg  2.5 mg Oral Q6H PRN Varney Biles, MD   2.5 mg at 12/22/14 0749  . zolpidem (AMBIEN) tablet 5 mg  5 mg Oral QHS PRN Carman Ching, PA-C   5 mg at 12/21/14 2141   Current Outpatient Prescriptions  Medication Sig Dispense Refill  . alprazolam (XANAX) 2 MG tablet Take 2-6 mg by mouth at bedtime as needed for sleep.    . divalproex (DEPAKOTE ER) 500 MG 24 hr tablet Take 500 mg by mouth 2 (two) times daily.   0  .  guanFACINE (TENEX) 1 MG tablet Take 1 tablet (1 mg total) by mouth 2 (two) times daily. (Patient taking differently: Take 1 mg by mouth at bedtime. ) 60 tablet 0  . ibuprofen (ADVIL,MOTRIN) 200 MG tablet Take 200 mg by mouth every 4 (four) hours as needed for moderate pain (pain).     Marland Kitchen omeprazole (PRILOSEC) 40 MG capsule Take 2 capsules (80 mg total) by mouth daily. For acid reflux    . diphenhydrAMINE (BENADRYL) 25 MG tablet Take 1 tablet (25 mg total) by mouth every 6 (six) hours. (Patient not taking: Reported on 12/16/2014) 20 tablet 0  . divalproex (DEPAKOTE) 500 MG DR tablet Take 1 tablet (500 mg total) by mouth 2 (two) times daily with a meal. (Patient not taking: Reported on 12/20/2014) 60 tablet 0  . EPINEPHrine 0.3 mg/0.3 mL IJ SOAJ injection Inject 0.3 mLs (0.3 mg total) into the muscle once. 1 Device 0  . meloxicam (MOBIC) 7.5 MG tablet Take 1 tablet (7.5 mg total) by mouth daily as needed (pain). (Patient not taking: Reported on 10/20/2014) 30 tablet 0  . oxyCODONE-acetaminophen (PERCOCET/ROXICET) 5-325 MG per tablet Take 1-2 tablets by mouth every 6 (six) hours as needed for severe pain. 15 tablet 0  . predniSONE (DELTASONE) 20 MG tablet 2 tabs po daily x 3 days (Patient not taking: Reported on 12/16/2014) 6 tablet 0  . sertraline (ZOLOFT) 100 MG tablet Take 1 tablet (100 mg total) by mouth daily. (Patient not taking: Reported on 10/20/2014) 30 tablet 0  . traZODone (DESYREL) 100 MG tablet Take 1 tablet (100 mg total) by mouth at bedtime. (Patient not taking: Reported on 10/20/2014) 30 tablet 0    Musculoskeletal: Strength & Muscle Tone: within normal limits Gait & Station: normal Patient leans: Backward  Psychiatric Specialty Exam:     Blood pressure 103/60, pulse 67, temperature 98 F (36.7 C), temperature source Oral, resp. rate 16, SpO2 98 %.There is no weight on file to calculate BMI.  General Appearance: Casual  Eye Contact::  Fair  Speech:  Normal Rate  Volume:  Normal  Mood:   Depressed  Affect:  Congruent  Thought Process:  Coherent  Orientation:  Full (Time, Place, and Person)  Thought Content:  Rumination  Suicidal Thoughts:  Yes.  with intent/plan  Homicidal Thoughts:  No  Memory:  Immediate;   Good Recent;   Fair Remote;   Fair  Judgement:  Poor  Insight:  Fair  Psychomotor Activity:  Decreased  Concentration:  Fair  Recall:  [employer] of Knowledge:Fair  Language: Fair  Akathisia:  No  Handed:  Right  AIMS (if indicated):     Assets:  Leisure Time Physical Health Resilience Social Support  ADL's:  Intact  Cognition: WNL  Sleep:      Medical Decision Making: Review of Psycho-Social Stressors (1), Review or order clinical lab tests (1) and Review of Medication Regimen & Side Effects (2)  Treatment Plan Summary: Daily contact with patient to assess and evaluate symptoms and progress in treatment, Medication management and Plan admit to inpatient psychiatry  Plan:  Admit to inpatient hospitalization for stabilization. Disposition:  Johny Sax, PMH-NP 12/22/2014 1:32 PM Patient seen face-to-face for psychiatric evaluation, chart reviewed and case discussed with the physician extender and developed treatment plan. Reviewed the information documented and agree with the treatment plan. Corena Pilgrim, MD

## 2014-12-22 NOTE — [facility] Counselor (Signed)
[facility] being [facility].

## 2014-12-22 NOTE — ED Notes (Signed)
Spoke with Beryle Beamsrevor, phlebotomy, regarding need for Valproic acid level draw.

## 2014-12-22 NOTE — ED Notes (Signed)
Pt. Noted resting in room. No acute distress or abnormal behavior noted. Will continue to monitor with security cameras. Q 15 minute rounds continue.l

## 2014-12-22 NOTE — ED Notes (Signed)
Pt. Noted sleeping in room. No complaints or concerns voiced. No distress or abnormal behavior noted. Will continue to monitor with security cameras. Q 15 minute rounds continue. 

## 2014-12-22 NOTE — ED Notes (Signed)
Report received from Tanika RN. Pt. Alert and oriented in no distress denies SI, HI, AVH and pain.  Pt. Instructed to come to me with problems or concerns.Will continue to monitor for safety via security cameras and Q 15 minute checks. 

## 2014-12-22 NOTE — ED Notes (Signed)
Pt. Noted in room. No distress or abnormal behavior noted. Will continue to monitor with security cameras. Q 15 minute rounds continue. 

## 2014-12-22 NOTE — ED Notes (Signed)
Pt. Noted in room. No complaints or concerns voiced. No distress or abnormal behavior noted. Will continue to monitor with security cameras. Q 15 minute rounds continue. 

## 2014-12-23 ENCOUNTER — Inpatient Hospital Stay
Admission: EM | Admit: 2014-12-23 | Discharge: 2014-12-25 | DRG: 885 | Disposition: A | Discharge status: Home / Self Care | Payer: Medicaid Other | Source: Intra-hospital | Attending: Psychiatry | Admitting: Psychiatry

## 2014-12-23 DIAGNOSIS — G40909 Epilepsy, unspecified, not intractable, without status epilepticus: Secondary | ICD-10-CM | POA: Diagnosis present

## 2014-12-23 DIAGNOSIS — Y907 Blood alcohol level of 200-239 mg/100 ml: Secondary | ICD-10-CM | POA: Diagnosis present

## 2014-12-23 DIAGNOSIS — F1590 Other stimulant use, unspecified, uncomplicated: Secondary | ICD-10-CM | POA: Insufficient documentation

## 2014-12-23 DIAGNOSIS — Z888 Allergy status to other drugs, medicaments and biological substances status: Secondary | ICD-10-CM | POA: Diagnosis not present

## 2014-12-23 DIAGNOSIS — S61512A Laceration without foreign body of left wrist, initial encounter: Secondary | ICD-10-CM | POA: Diagnosis present

## 2014-12-23 DIAGNOSIS — F1120 Opioid dependence, uncomplicated: Secondary | ICD-10-CM | POA: Diagnosis present

## 2014-12-23 DIAGNOSIS — R45851 Suicidal ideations: Secondary | ICD-10-CM | POA: Diagnosis present

## 2014-12-23 DIAGNOSIS — F17200 Nicotine dependence, unspecified, uncomplicated: Secondary | ICD-10-CM

## 2014-12-23 DIAGNOSIS — F319 Bipolar disorder, unspecified: Secondary | ICD-10-CM | POA: Diagnosis present

## 2014-12-23 DIAGNOSIS — F1320 Sedative, hypnotic or anxiolytic dependence, uncomplicated: Secondary | ICD-10-CM | POA: Diagnosis present

## 2014-12-23 DIAGNOSIS — K219 Gastro-esophageal reflux disease without esophagitis: Secondary | ICD-10-CM | POA: Diagnosis present

## 2014-12-23 DIAGNOSIS — F322 Major depressive disorder, single episode, severe without psychotic features: Principal | ICD-10-CM | POA: Diagnosis present

## 2014-12-23 DIAGNOSIS — X789XXA Intentional self-harm by unspecified sharp object, initial encounter: Secondary | ICD-10-CM | POA: Diagnosis present

## 2014-12-23 DIAGNOSIS — F10229 Alcohol dependence with intoxication, unspecified: Secondary | ICD-10-CM | POA: Diagnosis present

## 2014-12-23 DIAGNOSIS — F259 Schizoaffective disorder, unspecified: Secondary | ICD-10-CM | POA: Diagnosis present

## 2014-12-23 DIAGNOSIS — F209 Schizophrenia, unspecified: Secondary | ICD-10-CM | POA: Diagnosis present

## 2014-12-23 DIAGNOSIS — F410 Panic disorder [episodic paroxysmal anxiety] without agoraphobia: Secondary | ICD-10-CM | POA: Diagnosis present

## 2014-12-23 DIAGNOSIS — F17210 Nicotine dependence, cigarettes, uncomplicated: Secondary | ICD-10-CM | POA: Diagnosis present

## 2014-12-23 DIAGNOSIS — S61519A Laceration without foreign body of unspecified wrist, initial encounter: Secondary | ICD-10-CM | POA: Diagnosis present

## 2014-12-23 DIAGNOSIS — F1020 Alcohol dependence, uncomplicated: Secondary | ICD-10-CM

## 2014-12-23 DIAGNOSIS — Z79899 Other long term (current) drug therapy: Secondary | ICD-10-CM

## 2014-12-23 DIAGNOSIS — Z9889 Other specified postprocedural states: Secondary | ICD-10-CM | POA: Diagnosis not present

## 2014-12-23 HISTORY — DX: Other specified postprocedural states: Z98.890

## 2014-12-23 HISTORY — DX: Other specified postprocedural states: R11.2

## 2014-12-23 MED ORDER — CHLORDIAZEPOXIDE HCL 25 MG PO CAPS: 50.0000 mg | ORAL_CAPSULE | Freq: Three times a day (TID) | ORAL | Status: DC

## 2014-12-23 MED ORDER — IBUPROFEN 800 MG PO TABS
800.0000 mg | ORAL_TABLET | Freq: Three times a day (TID) | ORAL | Status: DC
  Administered 2014-12-23 – 2014-12-25 (×7): 800 mg via ORAL
  Filled 2014-12-23 (×7): qty 1

## 2014-12-23 MED ORDER — LOPERAMIDE HCL 2 MG PO CAPS: 4.0000 mg | ORAL_CAPSULE | ORAL | Status: DC | PRN

## 2014-12-23 MED ORDER — DIVALPROEX SODIUM ER 500 MG PO TB24
500.0000 mg | ORAL_TABLET | Freq: Two times a day (BID) | ORAL | Status: DC
Start: 2014-12-23 — End: 2014-12-25
  Administered 2014-12-23 – 2014-12-25 (×5): 500 mg via ORAL
  Filled 2014-12-23 (×8): qty 1

## 2014-12-23 MED ORDER — ALUM & MAG HYDROXIDE-SIMETH 200-200-20 MG/5ML PO SUSP
30.0000 mL | ORAL | Status: DC | PRN
  Filled 2014-12-23: qty 30

## 2014-12-23 MED ORDER — IMIPRAMINE HCL 25 MG PO TABS
25.0000 mg | ORAL_TABLET | Freq: Every day | ORAL | Status: DC
  Administered 2014-12-23 – 2014-12-24 (×2): 25 mg via ORAL
  Filled 2014-12-23 (×3): qty 1

## 2014-12-23 MED ORDER — OXYCODONE-ACETAMINOPHEN 5-325 MG PO TABS
2.0000 | ORAL_TABLET | Freq: Three times a day (TID) | ORAL | Status: DC | PRN
  Administered 2014-12-23 – 2014-12-25 (×5): 2 via ORAL
  Filled 2014-12-23 (×5): qty 2

## 2014-12-23 MED ORDER — LORAZEPAM 2 MG PO TABS: 2.0000 mg | ORAL_TABLET | Freq: Three times a day (TID) | ORAL | Status: DC | PRN

## 2014-12-23 MED ORDER — MAGNESIUM HYDROXIDE 400 MG/5ML PO SUSP: 30.0000 mL | Freq: Every day | ORAL | Status: DC | PRN

## 2014-12-23 MED ORDER — NICOTINE 14 MG/24HR TD PT24
14.0000 mg | MEDICATED_PATCH | Freq: Every day | TRANSDERMAL | Status: DC
  Administered 2014-12-23 – 2014-12-25 (×2): 14 mg via TRANSDERMAL
  Filled 2014-12-23 (×2): qty 1

## 2014-12-23 MED ORDER — PANTOPRAZOLE SODIUM 40 MG PO TBEC
40.0000 mg | DELAYED_RELEASE_TABLET | Freq: Every day | ORAL | Status: DC
  Administered 2014-12-23 – 2014-12-25 (×3): 40 mg via ORAL
  Filled 2014-12-23 (×3): qty 1

## 2014-12-23 MED ORDER — ACETAMINOPHEN 325 MG PO TABS: 650.0000 mg | ORAL_TABLET | Freq: Four times a day (QID) | ORAL | Status: DC | PRN

## 2014-12-23 MED ORDER — OXYCODONE-ACETAMINOPHEN 5-325 MG PO TABS: 2.0000 | ORAL_TABLET | Freq: Four times a day (QID) | ORAL | Status: DC | PRN

## 2014-12-23 NOTE — Progress Notes (Signed)
Documented in error.  COWS are not performed this facility.  [facility].

## 2014-12-23 NOTE — Plan of Care (Signed)
Problem: Consults Goal: Depression Patient Education See Patient Education Module for education specifics. Outcome: Progressing Patient is acceptance of stressors and depression.

## 2014-12-23 NOTE — Plan of Care (Signed)
Problem: Ineffective individual coping Goal: STG: Pt will be able to identify effective and ineffective STG: Pt will be able to identify effective and ineffective coping patterns  Outcome: Not Progressing Patient just admitted 12/22/14. Still requires med adjustment, education and therapy.
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
## 2014-12-23 NOTE — [facility] Suicide Risk Assessment (Signed)
[facility]   Nursing information obtained from:    Demographic factors:    Current Mental Status:    Loss Factors:    Historical Factors:    Risk Reduction Factors:    Total Time spent with patient: 1 hour Principal Problem: Severe major depression without psychotic features Diagnosis:   Patient Active Problem List   Diagnosis Date Noted  . Severe major depression without psychotic features [F32.2] 12/23/2014  . Alcohol use disorder, severe, dependence [F10.20] 12/23/2014  . Tobacco use disorder [Z72.0] 12/23/2014  . Seizure disorder [G40.909] 12/23/2014  . Self-inflicted laceration of wrist [S61.519A]   . Opiate dependence [F11.20] 09/04/2014  . Benzodiazepine dependence [F13.20] 04/21/2014  . Suicidal ideation [R45.851] 04/19/2014     Continued Clinical Symptoms:    The "Alcohol Use Disorders Identification Test", Guidelines for Use in Primary Care, Second Edition.  World Science writerHealth [employer] [facility](WHO). Score between 0-7:  no or low risk or alcohol related problems. Score between 8-15:  moderate risk of alcohol related problems. Score between 16-19:  high risk of alcohol related problems. Score 20 or above:  warrants further diagnostic evaluation for alcohol dependence and treatment.   CLINICAL FACTORS:   Depression:   Comorbid alcohol abuse/dependence Impulsivity Alcohol/Substance Abuse/Dependencies Epilepsy More than one psychiatric diagnosis Previous Psychiatric Diagnoses and Treatments   Musculoskeletal: Strength & Muscle Tone: within normal limits Gait & Station: normal Patient leans: N/A  Psychiatric Specialty Exam: Physical Exam  ROS                                                           COGNITIVE FEATURES THAT CONTRIBUTE TO RISK:  None    SUICIDE RISK:   Moderate:  Frequent suicidal ideation with limited intensity, and duration, some specificity in terms of plans, no associated intent, good  self-control, limited dysphoria/symptomatology, some risk factors present, and identifiable protective factors, including available and accessible social support.  PLAN OF CARE: Admit to behavioral health for treatment and is currently stationed  Medical Decision Making:  Established Problem, Stable/Improving (1)  I certify that inpatient services furnished can reasonably be expected to improve the patient's condition.   Jimmy FootmanHernandez-Gonzalez,  Sondos Wolfman 12/23/2014, 11:14 AM

## 2014-12-23 NOTE — Progress Notes (Signed)
[facility] He came in after placing a laceration on his left wrist with a box cutter, he denies suicidal ideation at this time. He report that he was just being impulsive and angry. He currently has a ace bandage wrapped around laceration it is dry and intact.  He is Ox4, cooperative, pleasant, he is depressed, he denies hallucinations. He appears to be sleep at this time resting in bed.

## 2014-12-23 NOTE — [facility] Group Notes (Signed)
[facility] Group Notes:  (Nursing/MHT/Case Management/Adjunct)  Date:  12/23/2014  Time:  11:50 PM  Type of Therapy:  Group Therapy  Participation Level:  Minimal  Participation Quality:  Appropriate  Affect:  Appropriate  Cognitive:  Appropriate  Insight:  None  Engagement in Group:  N/A  Modes of Intervention:  Wrap-up Group Summary of Progress/Problems:  [employer]VR IncChelsea Nanta Oliviah Agostini 12/23/2014, 11:50 PM

## 2014-12-23 NOTE — Progress Notes (Signed)
  D) Patient pleasant and cooperative upon my assessment. Patient completed Patient Self Inventory and  appetite is good. Patient rates depression as  8 /10, patient rates hopeless feelings as 5 /10. Patient denies SI/HI, denies A/V hallucinations.   A) Patient offered support and encouragement, patient encouraged to discuss feelings/concerns with staff. Patient verbalized understanding. Patient monitored Q15 minutes for safety. Patient met with MD  to discuss today's goals and plan of care.  R) Patient visible in milieu, attending groups in day room and meals in dining room. Patient appropriate with staff and peers.   Patient taking medications as ordered. Will continue to monitor.

## 2014-12-23 NOTE — [facility] Group Notes (Signed)
[facility] Group Notes:  (Nursing/MHT/Case Management/Adjunct)  Date:  12/23/2014  Time:  9:13 AM  Type of Therapy:  Group Therapy  Participation Level:  Active  Participation Quality:  Appropriate  Affect:  Appropriate  Cognitive:  Alert  Insight:  Appropriate  Engagement in Group:  Engaged  Modes of Intervention:  Education  Summary of Progress/Problems:  Russell MunroeClarence Melvin Shiniqua [name] 12/23/2014, 9:13 AM

## 2014-12-23 NOTE — [facility] Suicide Risk Assessment (Signed)
[facility] INPATIENT:  Family/Significant Other Suicide Prevention Education  Suicide Prevention Education:  Education Completed; Sondra ComeLaurie Tanner has been identified by the patient as the family member/significant other with whom the patient will be residing, and identified as the person(s) who will aid the patient in the event of a mental health crisis (suicidal ideations/suicide attempt).  With written consent from the patient, the family member/significant other has been provided the following suicide prevention education, prior to the and/or following the discharge of the patient.  The suicide prevention education provided includes the following:  Suicide risk factors  Suicide prevention and interventions  National Suicide Hotline telephone number  [facility] assessment telephone number  [facility] Assistance 911  [facility] and/or [facility]idential [facility] Crisis [facility] telephone number  Request made of family/significant other to:  Remove weapons (e.g., guns, rifles, knives), all items previously/currently identified as safety concern.    Remove drugs/medications (over-the-counter, prescriptions, illicit drugs), all items previously/currently identified as a safety concern.  The family member/significant other verbalizes understanding of the suicide prevention education information provided.  The family member/significant other agrees to remove the items of safety concern listed above.  Johnella MoloneyBandi, Jazlynne Milliner M 12/23/2014, 10:50 AM

## 2014-12-23 NOTE — Social Work (Signed)
LCSW met with patient and attempted to collect info from patient. He walked out of assessment after stating all doctors are messing with his medications, he has no family contact- he reports he lives with his wife but she is not involved with him. Patient left. Will attempt to re-engage.

## 2014-12-24 LAB — CBC WITH DIFFERENTIAL/PLATELET
Basophils Absolute: 0.1 10*3/uL (ref 0–0.1)
Basophils Relative: 1 %
EOS ABS: 0.3 10*3/uL (ref 0–0.7)
Eosinophils Relative: 4 %
HEMATOCRIT: 44 % (ref 40.0–52.0)
HEMOGLOBIN: 14.9 g/dL (ref 13.0–18.0)
LYMPHS ABS: 2.3 10*3/uL (ref 1.0–3.6)
Lymphocytes Relative: 29 %
MCH: 32.1 pg (ref 26.0–34.0)
MCHC: 33.8 g/dL (ref 32.0–36.0)
MCV: 95 fL (ref 80.0–100.0)
MONO ABS: 0.6 10*3/uL (ref 0.2–1.0)
MONOS PCT: 8 %
Neutro Abs: 4.6 10*3/uL (ref 1.4–6.5)
Neutrophils Relative %: 58 %
Platelets: 234 10*3/uL (ref 150–440)
RBC: 4.64 MIL/uL (ref 4.40–5.90)
RDW: 13.4 % (ref 11.5–14.5)
WBC: 7.9 10*3/uL (ref 3.8–10.6)

## 2014-12-24 LAB — COMPREHENSIVE METABOLIC PANEL
ALBUMIN: 4 g/dL (ref 3.5–5.0)
ALT: 22 U/L (ref 17–63)
AST: 23 U/L (ref 15–41)
Alkaline Phosphatase: 92 U/L (ref 38–126)
Anion gap: 8 (ref 5–15)
BILIRUBIN TOTAL: 0.3 mg/dL (ref 0.3–1.2)
BUN: 18 mg/dL (ref 6–20)
CHLORIDE: 102 mmol/L (ref 101–111)
CO2: 27 mmol/L (ref 22–32)
CREATININE: 0.87 mg/dL (ref 0.61–1.24)
Calcium: 9.3 mg/dL (ref 8.9–10.3)
GFR calc Af Amer: >60 mL/min (ref >60)
GFR calc non Af Amer: >60 mL/min (ref >60)
Glucose, Bld: 92 mg/dL (ref 65–99)
Potassium: 4.5 mmol/L (ref 3.5–5.1)
Sodium: 137 mmol/L (ref 135–145)
TOTAL PROTEIN: 7.1 g/dL (ref 6.5–8.1)

## 2014-12-24 LAB — TSH: TSH: 2.668 u[IU]/mL (ref 0.350–4.500)

## 2014-12-24 MED ORDER — MAGNESIUM HYDROXIDE 400 MG/5ML PO SUSP: 30.0000 mL | Freq: Every day | ORAL | Status: DC | PRN

## 2014-12-24 MED ORDER — ACETAMINOPHEN 325 MG PO TABS: 650.0000 mg | ORAL_TABLET | Freq: Four times a day (QID) | ORAL | Status: DC | PRN

## 2014-12-24 NOTE — Progress Notes (Signed)
Patient alert and oriented.  Affect bright.  Patient's bandage on left wrist was changed. Site was cleaned with NS.  Lacerations are well approximated sutures in place. Slight redness noted but no purulent drainage or odor apparent.  Telfa dressing applied and wrist rewrapped with ace bandage.  Pt tol procedure well.  Patient has been visible in milieu and attended groups.  Has been medication compliant.  Will cont to monitor for safety.

## 2014-12-24 NOTE — [facility] Counselor (Signed)
[facility] Assessment Progress Note   CSW received call from [facility] who [facility]. CSW Trinna Postlex reports that bed is available at [facility] and could go to [facility] Monday if Regional Referral Form is sent. CSW spoke with attending MD and attending MD reports she does not feel IVC to [facility] would be appropriate and pt could go if he is seeking treatment. [facility] CSW asked pt if he was still having SI and pt reports no and pt states he does not need [facility] or treatment as pt reports being a social drinker. Pt would like to discharge tomorrow.

## 2014-12-24 NOTE — Progress Notes (Signed)
[facility]  12/24/2014 9:56 AM Russell SessionsChristopher [name]  MRN:  [id number] Subjective:  Patient admitted after he cut his arm with a box cutter while intoxicated.  Patient has a prior history of depression and substance abuse. Marland Kitchen. He has limited insight into his addiction and denies having any problems with abusing substances or prescription medications. Where he was intoxicated at admission, he appears [place] as his been requesting the Percocet (he has a history of opioid abuse and benzodiazepine abuse).  Today the patient reports feeling better he feels shame and guilt for cutting himself. He says that he doesn't understand how he did that. He denies ever having any similar behaviors.  . Denies major problems with sleep appetite energy or concentration. Denies suicidality homicidality or auditory or visual hallucinations. Patient denies any side effects from his medications. He states that the pain in his arm is much better as the cast he had was removed.  Patient hoping to be discharged home soon. [facility].  Principal Problem: Severe major depression without psychotic features Diagnosis:   Patient Active Problem List   Diagnosis Date Noted  . Severe major depression without psychotic features [F32.2] 12/23/2014  . Alcohol use disorder, severe, dependence [F10.20] 12/23/2014  . Tobacco use disorder [Z72.0] 12/23/2014  . Seizure disorder [G40.909] 12/23/2014  . Stimulant use disorder [F15.99]   . Benzodiazepine abuse [F13.10]   . Opioid use disorder, severe, dependence [F11.20]   . Self-inflicted laceration of wrist [S61.519A]   . Opiate dependence [F11.20] 09/04/2014  . Benzodiazepine dependence [F13.20] 04/21/2014  . Suicidal ideation [R45.851] 04/19/2014   Total Time spent with patient: 30 minutes   Past Medical History:  Past Medical History  Diagnosis Date  . Hernia   . Bipolar 1 disorder   . Epilepsy   . Schizophrenia   .  Depression   . PONV (postoperative nausea and vomiting)     Over twenty years ago.    Past Surgical History  Procedure Laterality Date  . Ankle fracture surgery  1994 - approximate  . Ligament repair  1998    right arm   Family History: History reviewed. No pertinent family history. Social History:  History  Alcohol Use  . Yes    Comment: socially     History  Drug Use No    History   Social History  . Marital Status: Single    Spouse Name: N/A  . Number of Children: N/A  . Years of Education: N/A   Social History Main Topics  . Smoking status: Current Some Day Smoker    Types: Cigarettes  . Smokeless tobacco: Never Used  . Alcohol Use: Yes     Comment: socially  . Drug Use: No  . Sexual Activity: Not on file   Other Topics Concern  . None   Social History Narrative   Additional History:    Sleep: Good  Appetite:  Good   Assessment:   Musculoskeletal: Strength & Muscle Tone: within normal limits Gait & Station: normal Patient leans: N/A   Psychiatric Specialty Exam: Physical Exam  Review of Systems  Respiratory: Negative for cough.   Cardiovascular: Negative for chest pain.  Gastrointestinal: Negative for nausea, vomiting, abdominal pain, diarrhea and constipation.  Musculoskeletal: Positive for joint pain.  Neurological: Negative for headaches.  Psychiatric/Behavioral: Positive for depression and substance abuse. Negative for suicidal ideas and hallucinations. The patient is not nervous/anxious and does not have insomnia.  Blood pressure 136/86, pulse 64, temperature 98.1 F (36.7 C), temperature source Oral, resp. rate 18, height 6\' 1"  (1.854 m), weight 95.255 kg (210 lb).Body mass index is 27.71 kg/(m^2).  General Appearance: Well Groomed  Patent attorneyye Contact::  Good  Speech:  Normal Rate  Volume:  Normal  Mood:  Euthymic  Affect:  Congruent  Thought Process:  Linear and Logical  Orientation:  Full (Time, Place, and Person)  Thought Content:   Hallucinations: None  Suicidal Thoughts:  No  Homicidal Thoughts:  No  Memory:  Immediate;   Good Recent;   Good Remote;   Good  Judgement:  Fair  Insight:  Lacking  Psychomotor Activity:  Normal  Concentration:  Good  Recall:  NA  Fund of Knowledge:Good  Language: Good  Akathisia:  No  Handed:    AIMS (if indicated):     Assets:  Engineer, maintenanceCommunication Skills Housing Intimacy Physical Health  ADL's:  Intact  Cognition: WNL  Sleep:  Number of Hours: 6.5     Current Medications: Current Facility-Administered Medications  Medication Dose Route Frequency Provider Last Rate Last Dose  . acetaminophen (TYLENOL) tablet 650 mg  650 mg Oral Q6H PRN Charm RingsJamison Y Lord, NP      . alum & mag hydroxide-simeth (MAALOX/MYLANTA) 200-200-20 MG/5ML suspension 30 mL  30 mL Oral Q4H PRN Charm RingsJamison Y Lord, NP      . divalproex (DEPAKOTE ER) 24 hr tablet 500 mg  500 mg Oral BID WC Charm RingsJamison Y Lord, NP   500 mg at 12/24/14 0830  . ibuprofen (ADVIL,MOTRIN) tablet 800 mg  800 mg Oral TID Jimmy FootmanAndrea Hernandez-Gonzalez, MD   800 mg at 12/24/14 0830  . imipramine (TOFRANIL) tablet 25 mg  25 mg Oral QHS Jimmy FootmanAndrea Hernandez-Gonzalez, MD   25 mg at 12/23/14 2219  . LORazepam (ATIVAN) tablet 2 mg  2 mg Oral Q8H PRN Jimmy FootmanAndrea Hernandez-Gonzalez, MD      . magnesium hydroxide (MILK OF MAGNESIA) suspension 30 mL  30 mL Oral Daily PRN Charm RingsJamison Y Lord, NP      . nicotine (NICODERM CQ - dosed in mg/24 hours) patch 14 mg  14 mg Transdermal Daily Jimmy FootmanAndrea Hernandez-Gonzalez, MD   14 mg at 12/23/14 1221  . oxyCODONE-acetaminophen (PERCOCET/ROXICET) 5-325 MG per tablet 2 tablet  2 tablet Oral Q8H PRN Jimmy FootmanAndrea Hernandez-Gonzalez, MD   2 tablet at 12/23/14 2219  . pantoprazole (PROTONIX) EC tablet 40 mg  40 mg Oral Daily Jimmy FootmanAndrea Hernandez-Gonzalez, MD   40 mg at 12/24/14 0831    Lab Results:  Results for orders placed or performed during the hospital encounter of 12/23/14 (from the past 48 hour(s))  CBC with Differential/Platelet     Status: None    Collection Time: 12/24/14  6:58 AM  Result Value Ref Range   WBC 7.9 3.8 - 10.6 K/uL   RBC 4.64 4.40 - 5.90 MIL/uL   Hemoglobin 14.9 13.0 - 18.0 g/dL   HCT 16.144.0 09.640.0 - 04.552.0 %   MCV 95.0 80.0 - 100.0 fL   MCH 32.1 26.0 - 34.0 pg   MCHC 33.8 32.0 - 36.0 g/dL   RDW 40.913.4 81.111.5 - 91.414.5 %   Platelets 234 150 - 440 K/uL   Neutrophils Relative % 58 %   Neutro Abs 4.6 1.4 - 6.5 K/uL   Lymphocytes Relative 29 %   Lymphs Abs 2.3 1.0 - 3.6 K/uL   Monocytes Relative 8 %   Monocytes Absolute 0.6 0.2 - 1.0 K/uL   Eosinophils Relative 4 %  Eosinophils Absolute 0.3 0 - 0.7 K/uL   Basophils Relative 1 %   Basophils Absolute 0.1 0 - 0.1 K/uL    Physical Findings: AIMS:  , ,  ,  ,    CIWA:  CIWA-Ar Total: 0 COWS:  COWS Total Score: 0  Treatment Plan Summary: Daily contact with patient to assess and evaluate symptoms and progress in treatment and Medication management   Major depressive disorder: Patient is states that the only antidepressant that has worked for him in the past has been imipramine. He states that he has tried all other antidepressants with no response or side effects. Continue imipramine 25 mg by mouth daily at bedtime.  Alcohol dep/ withdrawal: Vital signs have been within the normal limits. No evidence of alcohol withdrawal at this time. Alcohol level in the emergency room 229  Benzodiazepine dependence: No evidence of withdrawal at this time. Patient displayed med seeking behaviors. Urine toxicology positive for benzodiazepine  Opioid dependence: No evidence of withdrawal at this time. Patient is focused on receiving pain medications.  Continue ibuprofen 3 times a day and Percocet when necessary every 8 hours  Self-inflicted injury to wrist: Per notes from the emergency department there was no evidence of laceration over the right radial artery or any evidence of tendon injury.Patient denies tingling numbness. He is able to move all fingers.  Pain: We will order ibuprofen  800 mg 3 times a day. Continue Percocet 2 tabs every 8 hours as needed.  GERD: Prilosec 80 mg by mouth daily  Seizure disorder continue Depakote 500 mg by mouth twice a day. Depakote level was 31  Labs: comprehensive metabolic panel and TSH are pending.  Discharge Planning: social worker contacted me this morning is states that he has a bed available at [facility] tomorrow.  Will discuss with patient see if he is interested.  If not most likely the patient will be discharged back home without follow-up at [facility] early next week.  Medical Decision Making:  Established Problem, Stable/Improving (1)     Jimmy Footman 12/24/2014, 9:56 AM

## 2014-12-24 NOTE — Plan of Care (Signed)
Problem: Ineffective individual coping Goal: LTG: Patient will report a decrease in negative feelings Outcome: Progressing Feels more positive Goal: STG: Patient will remain free from self harm Outcome: Progressing No self-harm  Problem: Alteration in mood Goal: LTG-Patient reports reduction in suicidal thoughts (Patient reports reduction in suicidal thoughts and is able to verbalize a safety plan for whenever patient is feeling suicidal)  Outcome: Progressing Denies Goal: STG-Patient reports thoughts of self-harm to staff Outcome: Progressing No self-harm

## 2014-12-24 NOTE — [facility] Group Notes (Signed)
[facility] LCSW Group Therapy  12/24/2014 2:48 PM  Type of Therapy:  Psychoeducational Skills  Participation Level:  Minimal  Participation Quality:  Appropriate and Attentive  Affect:  Appropriate  Cognitive:  Alert and Appropriate  Insight:  Engaged  Engagement in Therapy:  Engaged  Modes of Intervention:  Socialization and Support  Summary of Progress/Problems: Pt arrived 10 minutes late for group. Pt participated minimally but was attentive during group discussion.   Beryl MeagerIngle, Drisana Schweickert T 12/24/2014, 2:48 PM

## 2014-12-24 NOTE — Progress Notes (Signed)
Patient alert and oriented.  Continues to have a bright affect.  Good eye contact.  alliance forming with staff.  Patient was medication compliant and visible in milieu.  Will cont to monitor for safety.

## 2014-12-24 NOTE — [facility] Group Notes (Signed)
[facility] Group Notes:  (Nursing/MHT/Case Management/Adjunct)  Date:  12/24/2014  Time:  10:34 PM  Type of Therapy:  Group Therapy  Participation Level:  Active  Participation Quality:  Appropriate  Affect:  Appropriate  Cognitive:  Appropriate  Insight:  Appropriate  Engagement in Group:  Engaged  Modes of Intervention:  Support  Summary of Progress/Problems:  Russell ReddenOlivia Briana [name] 12/24/2014, 10:34 PM

## 2014-12-24 NOTE — [facility] Counselor (Signed)
[facility] is the [facility] who managed pt. Medicaid. Writer is unable to access [facility] to submit TAR.  

## 2014-12-25 DIAGNOSIS — F1320 Sedative, hypnotic or anxiolytic dependence, uncomplicated: Secondary | ICD-10-CM

## 2014-12-25 MED ORDER — OXYCODONE-ACETAMINOPHEN 5-325 MG PO TABS: 1.0000 | ORAL_TABLET | Freq: Three times a day (TID) | ORAL | Status: DC | PRN

## 2014-12-25 MED ORDER — IBUPROFEN 800 MG PO TABS: 800.0000 mg | ORAL_TABLET | Freq: Three times a day (TID) | ORAL | Status: DC | PRN

## 2014-12-25 MED ORDER — IMIPRAMINE HCL 25 MG PO TABS: 25.0000 mg | ORAL_TABLET | Freq: Every day | ORAL | Status: DC

## 2014-12-25 MED ORDER — DIVALPROEX SODIUM ER 500 MG PO TB24: 500.0000 mg | ORAL_TABLET | Freq: Two times a day (BID) | ORAL | Status: DC

## 2014-12-25 MED ORDER — OMEPRAZOLE 40 MG PO CPDR
80.0000 mg | DELAYED_RELEASE_CAPSULE | Freq: Every day | ORAL | Status: DC
Start: 2014-12-25 — End: 2015-06-30

## 2014-12-25 MED ORDER — NICOTINE 14 MG/24HR TD PT24: 14.0000 mg | MEDICATED_PATCH | Freq: Every day | TRANSDERMAL | Status: DC

## 2014-12-25 NOTE — Tx Team (Signed)
Initial Interdisciplinary Treatment Plan   PATIENT STRESSORS: Substance abuse   PATIENT STRENGTHS: Barrister's clerkCommunication skills Motivation for treatment/growth   PROBLEM LIST: Problem List/Patient Goals Date to be addressed Date deferred Reason deferred Estimated date of resolution  Substance abuse      Depression                                                  DISCHARGE CRITERIA:  Improved stabilization in mood, thinking, and/or behavior Motivation to continue treatment in a less acute level of care  PRELIMINARY DISCHARGE PLAN: Attend aftercare/continuing care group Attend 12-step recovery group  PATIENT/FAMIILY INVOLVEMENT: This treatment plan has been presented to and reviewed with the patient, Russell [name].  The patient and family have been given the opportunity to ask questions and make suggestions.  Russell [name] 12/25/2014, 8:42 AM

## 2014-12-25 NOTE — [facility] Group Notes (Signed)
[facility]  12/25/2014 11:47 AM  Participation Quality:  Appropriate and Attentive  Affect:  Appropriate  Cognitive:  Alert, Appropriate and Oriented  Insight:  Engaged  Engagement in Group:  Engaged  Modes of Intervention:  Socialization and Support  Summary of Progress/Problems: Pt participated in group appropriately, received Monday workbook on "Wellness" and shared that his SMART goal is to "get in touch with someone for transportation today because I found out that I am discharging. In the future, I plan to work out as an outlet when I have negative thoughts or feelings".   Beryl Meagerngle, Naomie Crow T 12/25/2014, 11:47 AM

## 2014-12-25 NOTE — Progress Notes (Signed)
Patient discharged ambulatory to home, accompanied by father. Patient denies SI or HI. Discharge instructions reviewed with patient, he verbalizes understanding. Patient received copy of discharge instructions, prescriptions, and all personal belongings.
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
## 2014-12-25 NOTE — [facility] Suicide Risk Assessment (Signed)
[facility] INPATIENT:  Family/Significant Other Suicide Prevention Education  Suicide Prevention Education: Patient himself was provided a suicide prevention handout on Saturday May 7th at 1pm Patient Refusal for Family/Significant Other Suicide Prevention Education: The patient Russell [name] has refused to provide written consent for family/significant other to be provided Family/Significant Other Suicide Prevention Education during admission and/or prior to discharge.  Physician notified.  Arrie SenateBandi, Ferd Horrigan M 12/25/2014, 10:33 AM

## 2014-12-25 NOTE — Progress Notes (Signed)
  [facility] :  Will you be returning to the same living situation after discharge:  Yes,    At discharge, do you have transportation home?: Yes,    Do you have the ability to pay for your medications: Yes,     Release of information consent forms completed and in the chart;  Patient's signature needed at discharge.  Patient to Follow up at: Follow-up Information    Follow up with [facility]. Go in 1 week.   Why:  Hospital Discharge Follow up/ Patient is to call his CST providersto transport him to standing appointment May 18th,2016   Contact information:   [address] [place] [phone] Fax [phone]      Patient denies SI/HI: Yes,       Safety Planning and Suicide Prevention discussed: Yes,  May 7th Handout provided and reviewed with patient  Have you used any form of tobacco in the last 30 days? (Cigarettes, Smokeless Tobacco, Cigars, and/or Pipes): Yes  Has patient been referred to the Quitline?: Patient refused referral  Arrie SenateBandi, Haniyah Maciolek M 12/25/2014, 10:34 AM

## 2014-12-25 NOTE — [facility] Suicide Risk Assessment (Signed)
[facility]   Demographic Factors:  Male, Caucasian and Unemployed  Total Time spent with patient: 30 minutes  Musculoskeletal: Strength & Muscle Tone: within normal limits Gait & Station: normal Patient leans: N/A  Psychiatric Specialty Exam: Physical Exam  ROS  Blood pressure 145/91, pulse 66, temperature 97.5 F (36.4 C), temperature source Oral, resp. rate 18, height 6\' 1"  (1.854 m), weight 95.255 kg (210 lb).Body mass index is 27.71 kg/(m^2).                                                       Have you used any form of tobacco in the last 30 days? (Cigarettes, Smokeless Tobacco, Cigars, and/or Pipes): Yes  Has this patient used any form of tobacco in the last 30 days? 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  Patients presenting with no risk factors but with morbid ruminations; may be classified as minimal risk based on the severity of the depressive symptoms  Principal Problem: Severe major depression without psychotic features Discharge Diagnoses:  Patient Active Problem List   Diagnosis Date Noted  . Major depressive disorder,  recurrent, severe without psychotic features [F33.2]   . Severe major depression without psychotic features [F32.2] 12/23/2014  . Alcohol use disorder, severe, dependence [F10.20] 12/23/2014  . Tobacco use disorder [Z72.0] 12/23/2014  . Seizure disorder [G40.909] 12/23/2014  . Stimulant use disorder [F15.99]   . Benzodiazepine abuse [F13.10]   . Opioid use disorder, severe, dependence [F11.20]   . Self-inflicted laceration of wrist [S61.519A]   . Opiate dependence [F11.20] 09/04/2014  . Benzodiazepine dependence [F13.20] 04/21/2014  . Suicidal ideation [R45.851] 04/19/2014    Follow-up Information    Follow up with [facility]. Go in 1 week.   Why:  Hospital Discharge Follow up/ Patient is to call his CST providersto transport him to standing appointment May 18th,2016   Contact information:   [address] [place] [phone] Fax [phone]      Plan Of Care/Follow-up recommendations:  Other:  return to ER in 8-10 for sutures removal  Is patient on multiple antipsychotic therapies at discharge:  No   Has Patient had three or more failed trials of antipsychotic monotherapy by history:  No  Recommended Plan for Multiple Antipsychotic Therapies: NA    Jimmy FootmanHernandez-Gonzalez,  Abbott Jasinski 12/25/2014, 12:50 PM

## 2015-01-19 ENCOUNTER — Emergency Department (HOSPITAL_COMMUNITY)
Admission: EM | Admit: 2015-01-19 | Discharge: 2015-01-20 | Discharge status: Against Medical Advice | Payer: Medicaid Other | Attending: Emergency Medicine | Admitting: Emergency Medicine

## 2015-01-19 ENCOUNTER — Encounter (HOSPITAL_COMMUNITY): Payer: Self-pay | Admitting: Emergency Medicine

## 2015-01-19 ENCOUNTER — Emergency Department (HOSPITAL_COMMUNITY): Payer: Medicaid Other

## 2015-01-19 DIAGNOSIS — S0990XA Unspecified injury of head, initial encounter: Secondary | ICD-10-CM | POA: Insufficient documentation

## 2015-01-19 DIAGNOSIS — S0012XA Contusion of left eyelid and periocular area, initial encounter: Secondary | ICD-10-CM | POA: Insufficient documentation

## 2015-01-19 DIAGNOSIS — Z720 Tobacco use: Secondary | ICD-10-CM | POA: Diagnosis not present

## 2015-01-19 DIAGNOSIS — Y999 Unspecified external cause status: Secondary | ICD-10-CM | POA: Insufficient documentation

## 2015-01-19 DIAGNOSIS — Y92410 Unspecified street and highway as the place of occurrence of the external cause: Secondary | ICD-10-CM | POA: Insufficient documentation

## 2015-01-19 DIAGNOSIS — S060X9A Concussion with loss of consciousness of unspecified duration, initial encounter: Secondary | ICD-10-CM | POA: Diagnosis not present

## 2015-01-19 DIAGNOSIS — Y9389 Activity, other specified: Secondary | ICD-10-CM | POA: Diagnosis not present

## 2015-01-19 DIAGNOSIS — S59902A Unspecified injury of left elbow, initial encounter: Secondary | ICD-10-CM | POA: Diagnosis not present

## 2015-01-19 DIAGNOSIS — S4992XA Unspecified injury of left shoulder and upper arm, initial encounter: Secondary | ICD-10-CM | POA: Insufficient documentation

## 2015-01-19 DIAGNOSIS — Z8719 Personal history of other diseases of the digestive system: Secondary | ICD-10-CM | POA: Diagnosis not present

## 2015-01-19 DIAGNOSIS — S40812A Abrasion of left upper arm, initial encounter: Secondary | ICD-10-CM | POA: Insufficient documentation

## 2015-01-19 DIAGNOSIS — Z79899 Other long term (current) drug therapy: Secondary | ICD-10-CM | POA: Insufficient documentation

## 2015-01-19 DIAGNOSIS — G40909 Epilepsy, unspecified, not intractable, without status epilepticus: Secondary | ICD-10-CM | POA: Diagnosis not present

## 2015-01-19 DIAGNOSIS — F319 Bipolar disorder, unspecified: Secondary | ICD-10-CM | POA: Insufficient documentation

## 2015-01-19 DIAGNOSIS — T1490XA Injury, unspecified, initial encounter: Secondary | ICD-10-CM

## 2015-01-19 LAB — COMPREHENSIVE METABOLIC PANEL
ALBUMIN: 3.9 g/dL (ref 3.5–5.0)
ALT: 19 U/L (ref 17–63)
AST: 21 U/L (ref 15–41)
Alkaline Phosphatase: 85 U/L (ref 38–126)
Anion gap: 11 (ref 5–15)
BUN: 8 mg/dL (ref 6–20)
CALCIUM: 8.9 mg/dL (ref 8.9–10.3)
CO2: 25 mmol/L (ref 22–32)
CREATININE: 1.15 mg/dL (ref 0.61–1.24)
Chloride: 105 mmol/L (ref 101–111)
GFR calc Af Amer: >60 mL/min (ref >60)
Glucose, Bld: 92 mg/dL (ref 65–99)
Potassium: 4 mmol/L (ref 3.5–5.1)
SODIUM: 141 mmol/L (ref 135–145)
Total Bilirubin: 0.5 mg/dL (ref 0.3–1.2)
Total Protein: 7 g/dL (ref 6.5–8.1)

## 2015-01-19 LAB — CBC
HCT: 41.4 % (ref 39.0–52.0)
Hemoglobin: 14 g/dL (ref 13.0–17.0)
MCH: 31.3 pg (ref 26.0–34.0)
MCHC: 33.8 g/dL (ref 30.0–36.0)
MCV: 92.6 fL (ref 78.0–100.0)
Platelets: 243 10*3/uL (ref 150–400)
RBC: 4.47 MIL/uL (ref 4.22–5.81)
RDW: 12.8 % (ref 11.5–15.5)
WBC: 8.9 10*3/uL (ref 4.0–10.5)

## 2015-01-19 LAB — CBG MONITORING, ED: Glucose-Capillary: 89 mg/dL (ref 65–99)

## 2015-01-19 LAB — TYPE AND SCREEN
ABO/RH(D): A POS
Antibody Screen: NEGATIVE

## 2015-01-19 LAB — I-STAT CG4 LACTIC ACID, ED: Lactic Acid, Venous: 1.33 mmol/L (ref 0.5–2.0)

## 2015-01-19 LAB — CDS SEROLOGY

## 2015-01-19 LAB — ETHANOL: Alcohol, Ethyl (B): 191 mg/dL — ABNORMAL HIGH (ref <5)

## 2015-01-19 LAB — PROTIME-INR
INR: 1.07 (ref 0.00–1.49)
PROTHROMBIN TIME: 14.1 s (ref 11.6–15.2)

## 2015-01-19 MED ORDER — ETOMIDATE 2 MG/ML IV SOLN
INTRAVENOUS | Status: AC
  Filled 2015-01-19: qty 20

## 2015-01-19 MED ORDER — SODIUM CHLORIDE 0.9 % IV BOLUS (SEPSIS)
500.0000 mL | Freq: Once | INTRAVENOUS | Status: AC
  Administered 2015-01-19: 500 mL via INTRAVENOUS

## 2015-01-19 MED ORDER — LIDOCAINE HCL (CARDIAC) 20 MG/ML IV SOLN
INTRAVENOUS | Status: AC
  Filled 2015-01-19: qty 5

## 2015-01-19 MED ORDER — SODIUM CHLORIDE 0.9 % IV BOLUS (SEPSIS): 125.0000 mL | Freq: Once | INTRAVENOUS | Status: DC

## 2015-01-19 MED ORDER — SUCCINYLCHOLINE CHLORIDE 20 MG/ML IJ SOLN
INTRAMUSCULAR | Status: AC
  Filled 2015-01-19: qty 1

## 2015-01-19 MED ORDER — IOHEXOL 300 MG/ML  SOLN
100.0000 mL | Freq: Once | INTRAMUSCULAR | Status: AC | PRN
  Administered 2015-01-19: 100 mL via INTRAVENOUS

## 2015-01-19 MED ORDER — ROCURONIUM BROMIDE 50 MG/5ML IV SOLN
INTRAVENOUS | Status: AC
  Filled 2015-01-19: qty 2
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
## 2015-01-20 LAB — ABO/RH: ABO/RH(D): A POS
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
## 2015-01-20 NOTE — ED Notes (Signed)
This nurse called pt's phone number in chart to verify IV removal three times.  An 18G IV was found by this nurse in the trash in pt's room.  Pt did not answer phone.  This nurse left a voicemail requesting pt call ED to verify IV removal.  Charge nurse made aware.

## 2015-02-20 ENCOUNTER — Emergency Department (HOSPITAL_COMMUNITY)
Admission: EM | Admit: 2015-02-20 | Discharge: 2015-02-20 | Disposition: A | Discharge status: Home / Self Care | Payer: Medicaid Other | Attending: Emergency Medicine | Admitting: Emergency Medicine

## 2015-02-20 ENCOUNTER — Encounter (HOSPITAL_COMMUNITY): Payer: Self-pay | Admitting: Emergency Medicine

## 2015-02-20 ENCOUNTER — Emergency Department (HOSPITAL_COMMUNITY): Payer: Medicaid Other

## 2015-02-20 DIAGNOSIS — Y9289 Other specified places as the place of occurrence of the external cause: Secondary | ICD-10-CM | POA: Diagnosis not present

## 2015-02-20 DIAGNOSIS — Z720 Tobacco use: Secondary | ICD-10-CM | POA: Diagnosis not present

## 2015-02-20 DIAGNOSIS — X30XXXA Exposure to excessive natural heat, initial encounter: Secondary | ICD-10-CM | POA: Insufficient documentation

## 2015-02-20 DIAGNOSIS — R55 Syncope and collapse: Secondary | ICD-10-CM | POA: Diagnosis not present

## 2015-02-20 DIAGNOSIS — Z79899 Other long term (current) drug therapy: Secondary | ICD-10-CM | POA: Insufficient documentation

## 2015-02-20 DIAGNOSIS — Z8669 Personal history of other diseases of the nervous system and sense organs: Secondary | ICD-10-CM | POA: Insufficient documentation

## 2015-02-20 DIAGNOSIS — Z8659 Personal history of other mental and behavioral disorders: Secondary | ICD-10-CM | POA: Diagnosis not present

## 2015-02-20 DIAGNOSIS — T675XXA Heat exhaustion, unspecified, initial encounter: Secondary | ICD-10-CM | POA: Diagnosis present

## 2015-02-20 DIAGNOSIS — Y990 Civilian activity done for income or pay: Secondary | ICD-10-CM | POA: Insufficient documentation

## 2015-02-20 DIAGNOSIS — Y9389 Activity, other specified: Secondary | ICD-10-CM | POA: Diagnosis not present

## 2015-02-20 LAB — BASIC METABOLIC PANEL
ANION GAP: 10 (ref 5–15)
BUN: 13 mg/dL (ref 6–20)
CHLORIDE: 103 mmol/L (ref 101–111)
CO2: 25 mmol/L (ref 22–32)
Calcium: 8.9 mg/dL (ref 8.9–10.3)
Creatinine, Ser: 1.25 mg/dL — ABNORMAL HIGH (ref 0.61–1.24)
GFR calc Af Amer: >60 mL/min (ref >60)
GFR calc non Af Amer: >60 mL/min (ref >60)
Glucose, Bld: 102 mg/dL — ABNORMAL HIGH (ref 65–99)
POTASSIUM: 3.7 mmol/L (ref 3.5–5.1)
SODIUM: 138 mmol/L (ref 135–145)

## 2015-02-20 LAB — CBC WITH DIFFERENTIAL/PLATELET
BASOS PCT: 0 % (ref 0–1)
Basophils Absolute: 0 10*3/uL (ref 0.0–0.1)
Eosinophils Absolute: 0 10*3/uL (ref 0.0–0.7)
Eosinophils Relative: 0 % (ref 0–5)
HCT: 39.9 % (ref 39.0–52.0)
Hemoglobin: 13.6 g/dL (ref 13.0–17.0)
Lymphocytes Relative: 10 % — ABNORMAL LOW (ref 12–46)
Lymphs Abs: 1.3 10*3/uL (ref 0.7–4.0)
MCH: 31.3 pg (ref 26.0–34.0)
MCHC: 34.1 g/dL (ref 30.0–36.0)
MCV: 91.9 fL (ref 78.0–100.0)
Monocytes Absolute: 0.9 10*3/uL (ref 0.1–1.0)
Monocytes Relative: 7 % (ref 3–12)
NEUTROS PCT: 83 % — AB (ref 43–77)
Neutro Abs: 10.4 10*3/uL — ABNORMAL HIGH (ref 1.7–7.7)
PLATELETS: 204 10*3/uL (ref 150–400)
RBC: 4.34 MIL/uL (ref 4.22–5.81)
RDW: 13.8 % (ref 11.5–15.5)
WBC: 12.7 10*3/uL — ABNORMAL HIGH (ref 4.0–10.5)

## 2015-02-20 LAB — HEPATIC FUNCTION PANEL
ALK PHOS: 101 U/L (ref 38–126)
ALT: 29 U/L (ref 17–63)
AST: 38 U/L (ref 15–41)
Albumin: 4.2 g/dL (ref 3.5–5.0)
Bilirubin, Direct: <0.1 mg/dL — ABNORMAL LOW (ref 0.1–0.5)
TOTAL PROTEIN: 7.5 g/dL (ref 6.5–8.1)
Total Bilirubin: 0.5 mg/dL (ref 0.3–1.2)

## 2015-02-20 LAB — URINALYSIS, ROUTINE W REFLEX MICROSCOPIC
BILIRUBIN URINE: NEGATIVE
GLUCOSE, UA: NEGATIVE mg/dL
Hgb urine dipstick: NEGATIVE
Ketones, ur: 15 mg/dL — AB
Leukocytes, UA: NEGATIVE
Nitrite: NEGATIVE
PROTEIN: NEGATIVE mg/dL
Specific Gravity, Urine: 1.01 (ref 1.005–1.030)
UROBILINOGEN UA: 1 mg/dL (ref 0.0–1.0)
pH: 7 (ref 5.0–8.0)

## 2015-02-20 LAB — I-STAT TROPONIN, ED: TROPONIN I, POC: 0 ng/mL (ref 0.00–0.08)

## 2015-02-20 LAB — I-STAT CHEM 8, ED
BUN: 12 mg/dL (ref 6–20)
CHLORIDE: 101 mmol/L (ref 101–111)
Calcium, Ion: 1.1 mmol/L — ABNORMAL LOW (ref 1.12–1.23)
Creatinine, Ser: 1.1 mg/dL (ref 0.61–1.24)
Glucose, Bld: 101 mg/dL — ABNORMAL HIGH (ref 65–99)
HEMATOCRIT: 44 % (ref 39.0–52.0)
Hemoglobin: 15 g/dL (ref 13.0–17.0)
Potassium: 3.8 mmol/L (ref 3.5–5.1)
SODIUM: 138 mmol/L (ref 135–145)
TCO2: 25 mmol/L (ref 0–100)

## 2015-02-20 LAB — MAGNESIUM: Magnesium: 1.5 mg/dL — ABNORMAL LOW (ref 1.7–2.4)

## 2015-02-20 LAB — CK: Total CK: 616 U/L — ABNORMAL HIGH (ref 49–397)

## 2015-02-20 LAB — VALPROIC ACID LEVEL: VALPROIC ACID LVL: 11 ug/mL — AB (ref 50.0–100.0)

## 2015-02-20 MED ORDER — METHOCARBAMOL 500 MG PO TABS
1000.0000 mg | ORAL_TABLET | Freq: Once | ORAL | Status: AC
  Administered 2015-02-20: 1000 mg via ORAL
  Filled 2015-02-20: qty 2

## 2015-02-20 MED ORDER — SODIUM CHLORIDE 0.9 % IV BOLUS (SEPSIS)
1000.0000 mL | Freq: Once | INTRAVENOUS | Status: AC
  Administered 2015-02-20: 1000 mL via INTRAVENOUS

## 2015-02-20 MED ORDER — SODIUM CHLORIDE 0.9 % IV SOLN: 1000.0000 mg | Freq: Once | INTRAVENOUS | Status: DC

## 2015-02-20 MED ORDER — MAGNESIUM SULFATE 2 GM/50ML IV SOLN
2.0000 g | Freq: Once | INTRAVENOUS | Status: AC
  Administered 2015-02-20: 2 g via INTRAVENOUS
  Filled 2015-02-20: qty 50

## 2015-02-20 MED ORDER — METHOCARBAMOL 500 MG PO TABS: 1000.0000 mg | ORAL_TABLET | Freq: Four times a day (QID) | ORAL | Status: DC | PRN

## 2015-02-20 MED ORDER — VALPROATE SODIUM 500 MG/5ML IV SOLN
1000.0000 mg | Freq: Once | INTRAVENOUS | Status: AC
  Administered 2015-02-20: 1000 mg via INTRAVENOUS
  Filled 2015-02-20: qty 10

## 2015-02-20 NOTE — ED Notes (Signed)
[facility] Expected date:  Expected time:  Means of arrival:  Comments: Heat exhaustion

## 2015-02-20 NOTE — ED Notes (Signed)
PA notified-patient reports generalized cramping, especially in legs. Robaxin order. Patient still able to move all extremities. Neurologically intact. No other c/c.

## 2015-02-20 NOTE — Discharge Instructions (Signed)
For breakthrough pain you may take Robaxin. Do not drink alcohol, drive or operate heavy machinery when taking Robaxin.  Please follow with your primary care doctor in the next 2 days for a check-up. They must obtain records for further management.   Do not hesitate to return to the Emergency Department for any new, worsening or concerning symptoms.    Heat-Related Illness Heat-related illnesses occur when the body is unable to properly cool itself. The body normally cools itself by sweating. However, under some conditions sweating is not enough. In these cases, a person's body temperature rises rapidly. Very high body temperatures may damage the brain or other vital organs. Some examples of heat-related illnesses include:  Heat stroke. This occurs when the body is unable to regulate its temperature. The body's temperature rises rapidly, the sweating mechanism fails, and the body is unable to cool down. Body temperature may rise to 106 F (41 C) or higher within 10 to 15 minutes. Heat stroke can cause death or permanent disability if emergency treatment is not provided.  Heat exhaustion. This is a milder form of heat-related illness that can develop after several days of exposure to high temperatures and not enough fluids. It is the body's response to an excessive loss of the water and salt contained in sweat.  Heat cramps. These usually affect people who sweat a lot during heavy activity. This sweating drains the body's salt and moisture. The low salt level in the muscles causes painful cramps. Heat cramps may also be a symptom of heat exhaustion. Heat cramps usually occur in the abdomen, arms, or legs. Get medical attention for cramps if you have heart problems or are on a low-sodium diet. Those that are at greatest risk for heat-related illnesses include:   The elderly.  Infant and the very young.  People with mental illness and chronic diseases.  People who are overweight  (obese).  Young and healthy people can even succumb to heat if they participate in strenuous physical activities during hot weather. CAUSES  Several factors affect the body's ability to cool itself during extremely hot weather. When the humidity is high, sweat will not evaporate as quickly. This prevents the body from releasing heat quickly. Other factors that can affect the body's ability to cool down include:   Age.  Obesity.  Fever.  Dehydration.  Heart disease.  Mental illness.  Poor circulation.  Sunburn.  Prescription drug use.  Alcohol use. SYMPTOMS  Heat stroke: Warning signs of heat stroke vary, but may include:  An extremely high body temperature (above 103F orally).  A fast, strong pulse.  Dizziness.  Confusion.  Red, hot, and dry skin.  No sweating.  Throbbing headache.  Feeling sick to your stomach (nauseous).  Unconsciousness. Heat exhaustion: Warning signs of heat exhaustion include:  Heavy sweating.  Tiredness.  Headache.  Paleness.  Weakness.  Feeling sick to your stomach (nauseous) or vomiting.  Muscle cramps. Heat cramps  Muscle pains or spasms. TREATMENT  Heat stroke  Get into a cool environment. An indoor place that is air-conditioned may be best.  Take a cool shower or bath. Have someone around to make sure you are okay.  Take your temperature. Make sure it is going down. Heat exhaustion  Drink plenty of fluids. Do not drink liquids that contain caffeine, alcohol, or large amounts of sugar. These cause you to lose more body fluid. Also, avoid very cold drinks. They can cause stomach cramps.  Get into a cool environment. An indoor  place that is air-conditioned may be best.  Take a cool shower or bath. Have someone around to make sure you are okay.  Put on lightweight clothing. Heat cramps  Stop whatever activity you were doing. Do not attempt to do that activity for at least 3 hours after the cramps have gone  away.  Get into a cool environment. An indoor place that is air-conditioned may be best. HOME CARE INSTRUCTIONS  To protect your health when temperatures are extremely high, follow these tips:  During heavy exercise in a hot environment, drink two to four glasses (16-32 ounces) of cool fluids each hour. Do not wait until you are thirsty to drink. Warning: If your caregiver limits the amount of fluid you drink or has you on water pills, ask how much you should drink while the weather is hot.  Do not drink liquids that contain caffeine, alcohol, or large amounts of sugar. These cause you to lose more body fluid.  Avoid very cold drinks. They can cause stomach cramps.  Wear appropriate clothing. Choose lightweight, light-colored, loose-fitting clothing.  If you must be outdoors, try to limit your outdoor activity to morning and evening hours. Try to rest often in shady areas.  If you are not used to working or exercising in a hot environment, start slowly and pick up the pace gradually.  Stay cool in an air-conditioned place if possible. If your home does not have air conditioning, go to the shopping mall or [employer].  Taking a cool shower or bath may help you cool off. SEEK MEDICAL CARE IF:   You see any of the symptoms listed above. You may be dealing with a life-threatening emergency.  Symptoms worsen or last longer than 1 hour.  Heat cramps do not get better in 1 hour. MAKE SURE YOU:   Understand these instructions.  Will watch your condition.  Will get help right away if you are not doing well or get worse. Document Released: 05/13/2008 Document Revised: 10/27/2011 Document Reviewed: 05/13/2008 [facility] Patient Information 2015 [place], [place]. This information is not intended to replace advice given to you by your health care provider. Make sure you discuss any questions you have with your health care provider.

## 2015-02-20 NOTE — ED Provider Notes (Signed)
CSN: [id number]     Arrival date & time 02/20/15  1548 History   First MD Initiated Contact with Patient 02/20/15 1557     Chief Complaint  Patient presents with  . Heat Exposure  . Dehydration     (Consider location/radiation/quality/duration/timing/severity/associated sxs/prior Treatment) HPI   Blood pressure 131/76, pulse 82, temperature 98 F (36.7 C), temperature source Oral, resp. rate 19, SpO2 98 %.  Russell SessionsChristopher [name] is a 44 y.o. male complaining of syncopal sensation after patient was doing yard work earlier in the day. Patient started work at 6:30 AM, he was doing demanding physical labor which is not atypical for him. He states that his muscle started cramping especially the bilateral upper extremities and then he had tingling in the fingers and around the mouth, he felt fatigued and like he couldn't stand. He had a moment of chest pain and shortness of breath. He syncopized in the last thing he remembers he was inside and EMS was there. Patient states he's been pushing fluids all day long but has not urinated today states that he feels significantly improved after 500 mL saline from EMS. States he is chest pain-free at this time. He has been taking his medications including Depakote for seizure disorder as instructed. She continues to have diffuse myalgia but he rates it as mild, 3 out of 10 and no exacerbating or alleviating factors are identified.  Past Medical History  Diagnosis Date  . Hernia   . Bipolar 1 disorder   . Epilepsy   . Schizophrenia   . Depression   . PONV (postoperative nausea and vomiting)     Over twenty years ago.   Past Surgical History  Procedure Laterality Date  . Ankle fracture surgery  1994 - approximate  . Ligament repair  1998    right arm   History reviewed. No pertinent family history. History  Substance Use Topics  . Smoking status: Current Some Day Smoker -- 0.50 packs/day    Types: Cigarettes  . Smokeless tobacco: Never Used  .  Alcohol Use: Yes    Review of Systems  10 systems reviewed and found to be negative, except as noted in the HPI.   Allergies  Ultram  Home Medications   Prior to Admission medications   Medication Sig Start Date End Date Taking? Authorizing Provider  divalproex (DEPAKOTE ER) 500 MG 24 hr tablet Take 1 tablet (500 mg total) by mouth 2 (two) times daily with a meal. 12/25/14  Yes Jimmy FootmanAndrea Hernandez-Gonzalez, MD  imipramine (TOFRANIL) 25 MG tablet Take 1 tablet (25 mg total) by mouth at bedtime. 12/25/14  Yes Jimmy FootmanAndrea Hernandez-Gonzalez, MD  omeprazole (PRILOSEC) 40 MG capsule Take 2 capsules (80 mg total) by mouth daily. 12/25/14  Yes Jimmy FootmanAndrea Hernandez-Gonzalez, MD  EPINEPHrine 0.3 mg/0.3 mL IJ SOAJ injection Inject 0.3 mLs (0.3 mg total) into the muscle once. Patient not taking: Reported on 02/20/2015 10/20/14   Junius FinnerErin O'Malley, PA-C  methocarbamol (ROBAXIN) 500 MG tablet Take 2 tablets (1,000 mg total) by mouth 4 (four) times daily as needed (Pain). 02/20/15   Nehemiah Mcfarren, PA-C  nicotine (NICODERM CQ - DOSED IN MG/24 HOURS) 14 mg/24hr patch Place 1 patch (14 mg total) onto the skin daily. Patient not taking: Reported on 01/20/2015 12/25/14   Jimmy FootmanAndrea Hernandez-Gonzalez, MD   BP 121/79 mmHg  Pulse 95  Temp(Src) 98 F (36.7 C) (Oral)  Resp 12  SpO2 97% Physical Exam  Constitutional: He is oriented to person, place, and time. He appears well-developed  and well-nourished. No distress.  HENT:  Head: Normocephalic and atraumatic.  Mouth/Throat: Oropharynx is clear and moist.  Eyes: Conjunctivae and EOM are normal. Pupils are equal, round, and reactive to light.  Cardiovascular: Normal rate, regular rhythm and intact distal pulses.   Pulmonary/Chest: Effort normal and breath sounds normal. No stridor.  Patient is producing sweat in the axilla  Abdominal: Soft. Bowel sounds are normal. He exhibits no distension and no mass. There is no tenderness. There is no rebound and no guarding.  Musculoskeletal:  Normal range of motion.  Neurological: He is alert and oriented to person, place, and time.  Psychiatric: He has a normal mood and affect.  Nursing note and vitals reviewed.   ED Course  Procedures (including critical care time) Labs Review Labs Reviewed  BASIC METABOLIC PANEL - Abnormal; Notable for the following:    Glucose, Bld 102 (*)    Creatinine, Ser 1.25 (*)    All other components within normal limits  CBC WITH DIFFERENTIAL/PLATELET - Abnormal; Notable for the following:    WBC 12.7 (*)    Neutrophils Relative % 83 (*)    Neutro Abs 10.4 (*)    Lymphocytes Relative 10 (*)    All other components within normal limits  MAGNESIUM - Abnormal; Notable for the following:    Magnesium 1.5 (*)    All other components within normal limits  CK - Abnormal; Notable for the following:    Total CK 616 (*)    All other components within normal limits  URINALYSIS, ROUTINE W REFLEX MICROSCOPIC (NOT AT [facility]) - Abnormal; Notable for the following:    Ketones, ur 15 (*)    All other components within normal limits  VALPROIC ACID LEVEL - Abnormal; Notable for the following:    Valproic Acid Lvl 11 (*)    All other components within normal limits  HEPATIC FUNCTION PANEL - Abnormal; Notable for the following:    Bilirubin, Direct <0.1 (*)    All other components within normal limits  I-STAT CHEM 8, ED - Abnormal; Notable for the following:    Glucose, Bld 101 (*)    Calcium, Ion 1.10 (*)    All other components within normal limits  I-STAT TROPOININ, ED    Imaging Review Dg Chest 2 View  02/20/2015   CLINICAL DATA:  Muscle cramping during lawn care  EXAM: CHEST  2 VIEW  COMPARISON:  01/19/2015  FINDINGS: Cardiomediastinal silhouette is stable. No acute infiltrate or pleural effusion. No pulmonary edema. Bony thorax is unremarkable.  IMPRESSION: No active cardiopulmonary disease.   Electronically Signed   By: Natasha Mead M.D.   On: 02/20/2015 17:29     EKG Interpretation None       MDM   Final diagnoses:  Syncope  Heat exhaustion, initial encounter  Hypomagnesemia    Filed Vitals:   02/20/15 1615 02/20/15 1734 02/20/15 1834 02/20/15 2043  BP: 119/73 131/76 126/81 121/79  Pulse: 92 82 84 95  Temp:      TempSrc:      Resp:  19 18 12   SpO2: 96% 98% 99% 97%    Medications  sodium chloride 0.9 % bolus 1,000 mL (0 mLs Intravenous Stopped 02/20/15 1833)  methocarbamol (ROBAXIN) tablet 1,000 mg (1,000 mg Oral Given 02/20/15 1854)  magnesium sulfate IVPB 2 g 50 mL (0 g Intravenous Stopped 02/20/15 2109)  valproate (DEPACON) 1,000 mg in dextrose 5 % 50 mL IVPB (0 mg Intravenous Stopped 02/20/15 2112)    Russell Deer  [name] is a pleasant 44 y.o. male presenting with syncopal sensation after patient was working outside in the heat all day long. Patient had a little chest pain and shortness of breath before the event. Patient is producing sweats, mentating appropriately. Vital signs are without abnormality. He reports significant subjective improvement however he still has diffuse myalgia. Will perform syncope workup then check basic blood work including Depakote level.  Depakote level subtherapeutic 11. Discussed with pharmacist Leanne who recommends loading with 1 g. His magnesium is also slightly low 1.5, will give him 2 g IV.  Discussed case with pharmacist Soledad Gerlach about depakote load, she recommends 1 g.  Patient rechecked, he reports feeling better, is into the tori without issue. States that he has been compliant with his Depakote. I've asked the patient to take the day off tomorrow, stay in a cool place and stay well hydrated. I've advised him that he will need to follow with his primary care doctor in the next week   Evaluation does not show pathology that would require ongoing emergent intervention or inpatient treatment. Pt is hemodynamically stable and mentating appropriately. Discussed findings and plan with patient/guardian, who agrees with care plan. All  questions answered. Return precautions discussed and outpatient follow up given.       Wynetta Emery, PA-C 02/20/15 2215  Arby Barrette, MD 02/21/15 [phone]

## 2015-02-20 NOTE — ED Notes (Signed)
RN TO DRAW BLOOD

## 2015-02-20 NOTE — ED Notes (Signed)
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 Muscles started cramping and extremities tingling. Diaphoretic on EMS arrival. Decreased PO intake today. Denies being able to urinate today. 500 cc NS given per EMS. 12 Lead unremarkable. VSS: BP 126/76 HR 80. A&Ox4. Denies LOC.

## 2015-03-09 ENCOUNTER — Emergency Department (HOSPITAL_COMMUNITY): Payer: Medicaid Other

## 2015-03-09 ENCOUNTER — Encounter (HOSPITAL_COMMUNITY): Payer: Self-pay | Admitting: Emergency Medicine

## 2015-03-09 ENCOUNTER — Emergency Department (HOSPITAL_COMMUNITY)
Admission: EM | Admit: 2015-03-09 | Discharge: 2015-03-09 | Disposition: A | Discharge status: Home / Self Care | Payer: Medicaid Other | Attending: Emergency Medicine | Admitting: Emergency Medicine

## 2015-03-09 DIAGNOSIS — G40909 Epilepsy, unspecified, not intractable, without status epilepticus: Secondary | ICD-10-CM | POA: Diagnosis not present

## 2015-03-09 DIAGNOSIS — Z79899 Other long term (current) drug therapy: Secondary | ICD-10-CM | POA: Insufficient documentation

## 2015-03-09 DIAGNOSIS — M545 Low back pain: Secondary | ICD-10-CM | POA: Diagnosis not present

## 2015-03-09 DIAGNOSIS — F319 Bipolar disorder, unspecified: Secondary | ICD-10-CM | POA: Diagnosis not present

## 2015-03-09 DIAGNOSIS — Z8719 Personal history of other diseases of the digestive system: Secondary | ICD-10-CM | POA: Diagnosis not present

## 2015-03-09 DIAGNOSIS — Z720 Tobacco use: Secondary | ICD-10-CM | POA: Insufficient documentation

## 2015-03-09 DIAGNOSIS — R569 Unspecified convulsions: Secondary | ICD-10-CM | POA: Diagnosis present

## 2015-03-09 LAB — URINE MICROSCOPIC-ADD ON

## 2015-03-09 LAB — CBC WITH DIFFERENTIAL/PLATELET
BASOS ABS: 0 10*3/uL (ref 0.0–0.1)
Basophils Relative: 0 % (ref 0–1)
Eosinophils Absolute: 0.1 10*3/uL (ref 0.0–0.7)
Eosinophils Relative: 1 % (ref 0–5)
HEMATOCRIT: 45 % (ref 39.0–52.0)
Hemoglobin: 15.3 g/dL (ref 13.0–17.0)
Lymphocytes Relative: 10 % — ABNORMAL LOW (ref 12–46)
Lymphs Abs: 1 10*3/uL (ref 0.7–4.0)
MCH: 31.8 pg (ref 26.0–34.0)
MCHC: 34 g/dL (ref 30.0–36.0)
MCV: 93.6 fL (ref 78.0–100.0)
MONO ABS: 0.5 10*3/uL (ref 0.1–1.0)
Monocytes Relative: 5 % (ref 3–12)
NEUTROS PCT: 84 % — AB (ref 43–77)
Neutro Abs: 8.3 10*3/uL — ABNORMAL HIGH (ref 1.7–7.7)
Platelets: 321 10*3/uL (ref 150–400)
RBC: 4.81 MIL/uL (ref 4.22–5.81)
RDW: 14.2 % (ref 11.5–15.5)
WBC: 10 10*3/uL (ref 4.0–10.5)

## 2015-03-09 LAB — URINALYSIS, ROUTINE W REFLEX MICROSCOPIC
BILIRUBIN URINE: NEGATIVE
Glucose, UA: 250 mg/dL — AB
HGB URINE DIPSTICK: NEGATIVE
KETONES UR: NEGATIVE mg/dL
LEUKOCYTES UA: NEGATIVE
NITRITE: NEGATIVE
PROTEIN: 30 mg/dL — AB
Specific Gravity, Urine: 1.027 (ref 1.005–1.030)
UROBILINOGEN UA: 0.2 mg/dL (ref 0.0–1.0)
pH: 6 (ref 5.0–8.0)

## 2015-03-09 LAB — COMPREHENSIVE METABOLIC PANEL
ALK PHOS: 97 U/L (ref 38–126)
ALT: 38 U/L (ref 17–63)
ANION GAP: 13 (ref 5–15)
AST: 47 U/L — ABNORMAL HIGH (ref 15–41)
Albumin: 4.8 g/dL (ref 3.5–5.0)
BUN: 21 mg/dL — AB (ref 6–20)
CHLORIDE: 104 mmol/L (ref 101–111)
CO2: 20 mmol/L — ABNORMAL LOW (ref 22–32)
Calcium: 10.1 mg/dL (ref 8.9–10.3)
Creatinine, Ser: 1.33 mg/dL — ABNORMAL HIGH (ref 0.61–1.24)
GFR calc Af Amer: >60 mL/min (ref >60)
Glucose, Bld: 189 mg/dL — ABNORMAL HIGH (ref 65–99)
POTASSIUM: 4.7 mmol/L (ref 3.5–5.1)
SODIUM: 137 mmol/L (ref 135–145)
Total Bilirubin: 0.6 mg/dL (ref 0.3–1.2)
Total Protein: 8.4 g/dL — ABNORMAL HIGH (ref 6.5–8.1)

## 2015-03-09 LAB — VALPROIC ACID LEVEL: VALPROIC ACID LVL: 17 ug/mL — AB (ref 50.0–100.0)

## 2015-03-09 MED ORDER — VALPROATE SODIUM 500 MG/5ML IV SOLN
1000.0000 mg | Freq: Once | INTRAVENOUS | Status: AC
  Administered 2015-03-09: 1000 mg via INTRAVENOUS
  Filled 2015-03-09: qty 10

## 2015-03-09 MED ORDER — SODIUM CHLORIDE 0.9 % IV BOLUS (SEPSIS)
1000.0000 mL | Freq: Once | INTRAVENOUS | Status: AC
Start: 2015-03-09 — End: 2015-03-09
  Administered 2015-03-09: 1000 mL via INTRAVENOUS

## 2015-03-09 MED ORDER — DIVALPROEX SODIUM ER 500 MG PO TB24: 500.0000 mg | ORAL_TABLET | Freq: Two times a day (BID) | ORAL | Status: DC

## 2015-03-09 NOTE — ED Provider Notes (Signed)
CSN: [id number]     Arrival date & time 03/09/15  0944 History   First MD Initiated Contact with Patient 03/09/15 [phone]     Chief Complaint  Patient presents with  . Seizures     (Consider location/radiation/quality/duration/timing/severity/associated sxs/prior Treatment) Patient is a 44 y.o. male presenting with seizures. The history is provided by the patient (the pt states he awoke in the ambulance.  he had a sz while he was working outside.  the pt states he has not been taking his medicine appropriately because he is running out).  Seizures Seizure activity on arrival: no   Seizure type:  Grand mal Preceding symptoms: no sensation of an aura present   Initial focality:  None Episode characteristics: confusion   Postictal symptoms: confusion   Return to baseline: no   Severity:  Moderate   Past Medical History  Diagnosis Date  . Hernia   . Bipolar 1 disorder   . Epilepsy   . Schizophrenia   . Depression   . PONV (postoperative nausea and vomiting)     Over twenty years ago.   Past Surgical History  Procedure Laterality Date  . Ankle fracture surgery  1994 - approximate  . Ligament repair  1998    right arm   No family history on file. History  Substance Use Topics  . Smoking status: Current Some Day Smoker -- 0.50 packs/day    Types: Cigarettes  . Smokeless tobacco: Never Used  . Alcohol Use: Yes    Review of Systems  Constitutional: Negative for appetite change and fatigue.  HENT: Negative for congestion, ear discharge and sinus pressure.   Eyes: Negative for discharge.  Respiratory: Negative for cough.   Cardiovascular: Negative for chest pain.  Gastrointestinal: Negative for abdominal pain and diarrhea.  Genitourinary: Negative for frequency and hematuria.  Musculoskeletal: Negative for back pain.  Skin: Negative for rash.  Neurological: Positive for seizures. Negative for headaches.  Psychiatric/Behavioral: Negative for hallucinations.       Allergies  Ultram  Home Medications   Prior to Admission medications   Medication Sig Start Date End Date Taking? Authorizing Provider  imipramine (TOFRANIL) 25 MG tablet Take 1 tablet (25 mg total) by mouth at bedtime. 12/25/14  Yes Jimmy Footman, MD  methocarbamol (ROBAXIN) 500 MG tablet Take 2 tablets (1,000 mg total) by mouth 4 (four) times daily as needed (Pain). 02/20/15  Yes Nicole Pisciotta, PA-C  omeprazole (PRILOSEC) 40 MG capsule Take 2 capsules (80 mg total) by mouth daily. 12/25/14  Yes Jimmy Footman, MD  divalproex (DEPAKOTE ER) 500 MG 24 hr tablet Take 1 tablet (500 mg total) by mouth 2 (two) times daily. 03/09/15   Bethann Berkshire, MD  EPINEPHrine 0.3 mg/0.3 mL IJ SOAJ injection Inject 0.3 mLs (0.3 mg total) into the muscle once. Patient not taking: Reported on 02/20/2015 10/20/14   Junius Finner, PA-C  nicotine (NICODERM CQ - DOSED IN MG/24 HOURS) 14 mg/24hr patch Place 1 patch (14 mg total) onto the skin daily. Patient not taking: Reported on 01/20/2015 12/25/14   Jimmy Footman, MD   BP 140/72 mmHg  Pulse 65  Temp(Src) 97.7 F (36.5 C) (Oral)  Resp 18  SpO2 100% Physical Exam  Constitutional: He is oriented to person, place, and time. He appears well-developed.  HENT:  Head: Normocephalic.  Eyes: Conjunctivae and EOM are normal. No scleral icterus.  Neck: Neck supple. No thyromegaly present.  Cardiovascular: Normal rate and regular rhythm.  Exam reveals no gallop and no friction  rub.   No murmur heard. Pulmonary/Chest: No stridor. He has no wheezes. He has no rales. He exhibits no tenderness.  Abdominal: He exhibits no distension. There is no tenderness. There is no rebound.  Musculoskeletal: Normal range of motion. He exhibits no edema.  Tender left lumbar muscles  Lymphadenopathy:    He has no cervical adenopathy.  Neurological: He is oriented to person, place, and time. He exhibits normal muscle tone. Coordination normal.  Skin:  No rash noted. No erythema.  Psychiatric: He has a normal mood and affect. His behavior is normal.    ED Course  Procedures (including critical care time) Labs Review Labs Reviewed  CBC WITH DIFFERENTIAL/PLATELET - Abnormal; Notable for the following:    Neutrophils Relative % 84 (*)    Neutro Abs 8.3 (*)    Lymphocytes Relative 10 (*)    All other components within normal limits  COMPREHENSIVE METABOLIC PANEL - Abnormal; Notable for the following:    CO2 20 (*)    Glucose, Bld 189 (*)    BUN 21 (*)    Creatinine, Ser 1.33 (*)    Total Protein 8.4 (*)    AST 47 (*)    All other components within normal limits  VALPROIC ACID LEVEL - Abnormal; Notable for the following:    Valproic Acid Lvl 17 (*)    All other components within normal limits  URINALYSIS, ROUTINE W REFLEX MICROSCOPIC (NOT AT [facility]) - Abnormal; Notable for the following:    Glucose, UA 250 (*)    Protein, ur 30 (*)    All other components within normal limits  URINE MICROSCOPIC-ADD ON - Abnormal; Notable for the following:    Casts HYALINE CASTS (*)    All other components within normal limits    Imaging Review Dg Lumbar Spine Complete  03/09/2015   CLINICAL DATA:  Seizure this morning fell to ground, now complaining of mid/low back pain and LEFT flank pain  EXAM: LUMBAR SPINE - COMPLETE 4+ VIEW  COMPARISON:  09/19/2012  FINDINGS: Five non-rib-bearing lumbar vertebra.  Vertebral body and disc space heights maintained.  Osseous mineralization grossly normal.  No acute fracture, subluxation, or bone destruction.  No spondylolysis.  SI joints symmetric.  IMPRESSION: No acute lumbar spine abnormalities.   Electronically Signed   By: Ulyses Southward M.D.   On: 03/09/2015 12:20   Ct Head Wo Contrast  03/09/2015   CLINICAL DATA:  Seizure  EXAM: CT HEAD WITHOUT CONTRAST  CT CERVICAL SPINE WITHOUT CONTRAST  TECHNIQUE: Multidetector CT imaging of the head and cervical spine was performed following the standard protocol without  intravenous contrast. Multiplanar CT image reconstructions of the cervical spine were also generated.  COMPARISON:  01/19/2015  FINDINGS: CT HEAD FINDINGS  No skull fracture is noted. Paranasal sinuses and mastoid air cells are unremarkable. No intracranial hemorrhage, mass effect or midline shift. No acute cortical infarction. No mass lesion is noted on this unenhanced scan. The gray and white-matter differentiation is preserved.  CT CERVICAL SPINE FINDINGS  Axial images of the cervical spine shows no acute fracture or subluxation. Computer processed images shows no acute fracture or subluxation. Mild degenerative changes C1-C2 articulation. Mild anterior spurring lower endplate of C5 and C6 vertebral body. Mild posterior spurring upper endplate of C5 vertebral body. Mild calcification of posterior longitudinal ligament at C4-C5 level. No prevertebral soft tissue swelling. Cervical airway is patent. There is no pneumothorax in visualized lung apices.  IMPRESSION: 1. No acute intracranial abnormality. 2. No  cervical spine acute fracture or subluxation. Mild degenerative changes as described above.   Electronically Signed   By: Natasha Mead M.D.   On: 03/09/2015 11:08   Ct Cervical Spine Wo Contrast  03/09/2015   CLINICAL DATA:  Seizure  EXAM: CT HEAD WITHOUT CONTRAST  CT CERVICAL SPINE WITHOUT CONTRAST  TECHNIQUE: Multidetector CT imaging of the head and cervical spine was performed following the standard protocol without intravenous contrast. Multiplanar CT image reconstructions of the cervical spine were also generated.  COMPARISON:  01/19/2015  FINDINGS: CT HEAD FINDINGS  No skull fracture is noted. Paranasal sinuses and mastoid air cells are unremarkable. No intracranial hemorrhage, mass effect or midline shift. No acute cortical infarction. No mass lesion is noted on this unenhanced scan. The gray and white-matter differentiation is preserved.  CT CERVICAL SPINE FINDINGS  Axial images of the cervical spine  shows no acute fracture or subluxation. Computer processed images shows no acute fracture or subluxation. Mild degenerative changes C1-C2 articulation. Mild anterior spurring lower endplate of C5 and C6 vertebral body. Mild posterior spurring upper endplate of C5 vertebral body. Mild calcification of posterior longitudinal ligament at C4-C5 level. No prevertebral soft tissue swelling. Cervical airway is patent. There is no pneumothorax in visualized lung apices.  IMPRESSION: 1. No acute intracranial abnormality. 2. No cervical spine acute fracture or subluxation. Mild degenerative changes as described above.   Electronically Signed   By: Natasha Mead M.D.   On: 03/09/2015 11:08     EKG Interpretation   Date/Time:  Friday March 09 2015 09:57:15 EDT Ventricular Rate:  85 PR Interval:  137 QRS Duration: 82 QT Interval:  367 QTC Calculation: 436 R Axis:   45 Text Interpretation:  Sinus rhythm RSR' in V1 or V2, probably normal  variant Confirmed by Bali Lyn  MD, Gwenn Teodoro [phone]) on 03/09/2015 1:50:58 PM      MDM   Final diagnoses:  Seizure disorder    Pt had a sz today.  He has not been taking his sz correctly,  Labs show depakote not theraputic.  Will rx more depakote and have pt follow up with pcp   Bethann Berkshire, MD 03/09/15 1402
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
## 2015-03-09 NOTE — ED Notes (Signed)
Pt transported to CT ?

## 2015-03-09 NOTE — ED Notes (Signed)
Pt transported to DG.  

## 2015-03-09 NOTE — Discharge Instructions (Signed)
Follow up with your family md in 1-2 weeks °

## 2015-03-09 NOTE — ED Notes (Signed)
Pt still unable to void at this time 

## 2015-03-09 NOTE — ED Notes (Signed)
Bed: WA17 Expected date:  Expected time:  Means of arrival:  Comments: EMS- 43yo M, seizure

## 2015-03-09 NOTE — ED Notes (Signed)
Pt given urinal and made aware of need for urine specimen 

## 2015-03-23 ENCOUNTER — Encounter: Payer: Self-pay | Admitting: Diagnostic Neuroimaging

## 2015-03-23 ENCOUNTER — Ambulatory Visit (INDEPENDENT_AMBULATORY_CARE_PROVIDER_SITE_OTHER): Payer: Medicaid Other | Admitting: Diagnostic Neuroimaging

## 2015-03-23 VITALS — BP 123/78 | HR 65 | Ht 73.0 in | Wt 198.0 lb

## 2015-03-23 DIAGNOSIS — G40909 Epilepsy, unspecified, not intractable, without status epilepticus: Secondary | ICD-10-CM | POA: Diagnosis not present

## 2015-03-23 DIAGNOSIS — G40309 Generalized idiopathic epilepsy and epileptic syndromes, not intractable, without status epilepticus: Secondary | ICD-10-CM

## 2015-03-23 DIAGNOSIS — G40209 Localization-related (focal) (partial) symptomatic epilepsy and epileptic syndromes with complex partial seizures, not intractable, without status epilepticus: Secondary | ICD-10-CM

## 2015-03-23 MED ORDER — DIVALPROEX SODIUM ER 500 MG PO TB24: 1000.0000 mg | ORAL_TABLET | Freq: Two times a day (BID) | ORAL | Status: DC

## 2015-03-23 NOTE — Patient Instructions (Signed)
-   According to [place] law, you can not drive unless you are seizure free for at least 6 months and under physician's care.   - Please maintain seizure precautions. Do not participate in activities where a loss of awareness could harm you or someone else. No swimming alone, no tub bathing, no hot tubs, no driving, no operating motorized vehicles (cars, ATVs, motocycles, etc), lawnmowers or power tools. No standing at heights, such as rooftops, ladders or stairs. Avoid hot objects such as stoves, heaters, open fires. Wear a helmet when riding a bicycle, scooter, skateboard, etc. and avoid areas of traffic. Set your water heater to 120 degrees or less.  - Increase divalproex ER to  twice a day
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
## 2015-05-08 ENCOUNTER — Encounter (HOSPITAL_COMMUNITY): Payer: Self-pay | Admitting: Nurse Practitioner

## 2015-05-08 ENCOUNTER — Emergency Department (HOSPITAL_COMMUNITY)
Admission: EM | Admit: 2015-05-08 | Discharge: 2015-05-09 | Disposition: A | Discharge status: Home / Self Care | Payer: Medicaid Other | Attending: Emergency Medicine | Admitting: Emergency Medicine

## 2015-05-08 DIAGNOSIS — Z79899 Other long term (current) drug therapy: Secondary | ICD-10-CM | POA: Insufficient documentation

## 2015-05-08 DIAGNOSIS — Z87891 Personal history of nicotine dependence: Secondary | ICD-10-CM | POA: Insufficient documentation

## 2015-05-08 DIAGNOSIS — Y9389 Activity, other specified: Secondary | ICD-10-CM | POA: Insufficient documentation

## 2015-05-08 DIAGNOSIS — G40909 Epilepsy, unspecified, not intractable, without status epilepticus: Secondary | ICD-10-CM | POA: Insufficient documentation

## 2015-05-08 DIAGNOSIS — Z8719 Personal history of other diseases of the digestive system: Secondary | ICD-10-CM | POA: Diagnosis not present

## 2015-05-08 DIAGNOSIS — Y998 Other external cause status: Secondary | ICD-10-CM | POA: Diagnosis not present

## 2015-05-08 DIAGNOSIS — Y9289 Other specified places as the place of occurrence of the external cause: Secondary | ICD-10-CM | POA: Insufficient documentation

## 2015-05-08 DIAGNOSIS — F319 Bipolar disorder, unspecified: Secondary | ICD-10-CM | POA: Diagnosis not present

## 2015-05-08 DIAGNOSIS — W57XXXA Bitten or stung by nonvenomous insect and other nonvenomous arthropods, initial encounter: Secondary | ICD-10-CM | POA: Diagnosis not present

## 2015-05-08 DIAGNOSIS — S60562A Insect bite (nonvenomous) of left hand, initial encounter: Secondary | ICD-10-CM | POA: Diagnosis present

## 2015-05-08 MED ORDER — DIPHENHYDRAMINE HCL 25 MG PO CAPS
25.0000 mg | ORAL_CAPSULE | Freq: Once | ORAL | Status: AC
  Administered 2015-05-09: 25 mg via ORAL
  Filled 2015-05-08: qty 1

## 2015-05-08 MED ORDER — IBUPROFEN 800 MG PO TABS
800.0000 mg | ORAL_TABLET | Freq: Once | ORAL | Status: AC
  Administered 2015-05-09: 800 mg via ORAL
  Filled 2015-05-08: qty 1

## 2015-05-08 NOTE — Discharge Instructions (Signed)
Apply ice to your hand. Take ibuprofen for swelling and benadryl for itching. Return if you develop any worsening swelling, redness streaking up your arm, fever, or worsening symptoms.  Insect Bite Mosquitoes, flies, fleas, bedbugs, and many other insects can bite. Insect bites are different from insect stings. A sting is when venom is injected into the skin. Some insect bites can transmit infectious diseases. SYMPTOMS  Insect bites usually turn red, swell, and itch for 2 to 4 days. They often go away on their own. TREATMENT  Your caregiver may prescribe antibiotic medicines if a bacterial infection develops in the bite. HOME CARE INSTRUCTIONS  Do not scratch the bite area.  Keep the bite area clean and dry. Wash the bite area thoroughly with soap and water.  Put ice or cool compresses on the bite area.  Put ice in a plastic bag.  Place a towel between your skin and the bag.  Leave the ice on for 20 minutes, 4 times a day for the first 2 to 3 days, or as directed.  You may apply a baking soda paste, cortisone cream, or calamine lotion to the bite area as directed by your caregiver. This can help reduce itching and swelling.  Only take over-the-counter or prescription medicines as directed by your caregiver.  If you are given antibiotics, take them as directed. Finish them even if you start to feel better. You may need a tetanus shot if:  You cannot remember when you had your last tetanus shot.  You have never had a tetanus shot.  The injury broke your skin. If you get a tetanus shot, your arm may swell, get red, and feel warm to the touch. This is common and not a problem. If you need a tetanus shot and you choose not to have one, there is a rare chance of getting tetanus. Sickness from tetanus can be serious. SEEK IMMEDIATE MEDICAL CARE IF:   You have increased pain, redness, or swelling in the bite area.  You see a red line on the skin coming from the bite.  You have a  fever.  You have joint pain.  You have a headache or neck pain.  You have unusual weakness.  You have a rash.  You have chest pain or shortness of breath.  You have abdominal pain, nausea, or vomiting.  You feel unusually tired or sleepy. MAKE SURE YOU:   Understand these instructions.  Will watch your condition.  Will get help right away if you are not doing well or get worse. Document Released: 09/11/2004 Document Revised: 10/27/2011 Document Reviewed: 03/05/2011 [facility] Patient Information 2015 [place], [place]. This information is not intended to replace advice given to you by your health care provider. Make sure you discuss any questions you have with your health care provider.

## 2015-05-08 NOTE — ED Provider Notes (Signed)
CSN: [id number]     Arrival date & time 05/08/15  2229 History   First MD Initiated Contact with Patient 05/08/15 2330     Chief Complaint  Patient presents with  . Insect Bite     (Consider location/radiation/quality/duration/timing/severity/associated sxs/prior Treatment) HPI Comments: 44 y/o M PMHx bipolar 1 d/o, schizophrenia, depression, anxiety and epilepsy presenting with concerns of an insect bite to his L hand occuring less than 1 hour PTA. He is concerned that he was bit by a black widow spider. He saw a spider that he swatted away. Reports swelling to the top of his hand that has gradually developed. It is tender to touch and pain increases when he makes a fist. No alleviating factors tried. No fevers. States his friend was recently bit by a black widow spider and admitted to the ICU so he was concerned. Denies IVDA.  The history is provided by the patient.    Past Medical History  Diagnosis Date  . Hernia   . Bipolar 1 disorder   . Schizophrenia   . Depression   . PONV (postoperative nausea and vomiting)     Over twenty years ago.  Marland Kitchen Anxiety   . Epilepsy     last sz 03/09/15   Past Surgical History  Procedure Laterality Date  . Ankle fracture surgery  1994 - approximate  . Ligament repair  1998    right arm/wrist   Family History  Problem Relation Age of Onset  . Diabetes Father   . Alcoholism Brother   . Cancer Maternal Grandmother   . Cancer Paternal Grandfather    Social History  Substance Use Topics  . Smoking status: Former Smoker -- 0.50 packs/day    Types: Cigarettes    Quit date: 03/22/2000  . Smokeless tobacco: Never Used  . Alcohol Use: 0.0 oz/week    0 Standard drinks or equivalent per week     Comment: 03/23/15 social    Review of Systems  Skin: Positive for wound.  All other systems reviewed and are negative.     Allergies  Ultram  Home Medications   Prior to Admission medications   Medication Sig Start Date End Date Taking?  Authorizing Provider  divalproex (DEPAKOTE ER) 500 MG 24 hr tablet Take 2 tablets (1,000 mg total) by mouth 2 (two) times daily. 03/23/15   Suanne Marker, MD  omeprazole (PRILOSEC) 40 MG capsule Take 2 capsules (80 mg total) by mouth daily. 12/25/14   Jimmy Footman, MD   BP 151/88 mmHg  Pulse 101  Temp(Src) 98.2 F (36.8 C) (Oral)  Resp 22  SpO2 100% Physical Exam  Constitutional: He is oriented to person, place, and time. He appears well-developed and well-nourished. No distress.  HENT:  Head: Normocephalic and atraumatic.  Eyes: Conjunctivae and EOM are normal.  Neck: Normal range of motion. Neck supple.  Cardiovascular: Normal rate, regular rhythm and normal heart sounds.   Pulmonary/Chest: Effort normal and breath sounds normal.  Musculoskeletal: Normal range of motion.  L hand with mild swelling dorsally over 4-5 metacarpals. No erythema or warmth. Tiny 1 mm scab laterally. No drainage, fluctuance or induration. Appears a local inflammatory response. No lymphatic streaking. Able to fully flex, extend, abduct and adduct all fingers. Normal opposition. Cap refill < 2 seconds. +2 radial pulse.  Neurological: He is alert and oriented to person, place, and time.  Skin: Skin is warm and dry.  Psychiatric: He has a normal mood and affect. His behavior is normal.  Nursing note and vitals reviewed.   ED Course  Procedures (including critical care time) Labs Review Labs Reviewed - No data to display  Imaging Review No results found. I have personally reviewed and evaluated these images and lab results as part of my medical decision-making.   EKG Interpretation None      MDM   Final diagnoses:  Insect bite of left hand, initial encounter   NAD. AFVSS. No abscess. No erythema or warmth concerning for infection. FROM hand without difficulty. No concern for tenosynovitis. Advised cool compresses, NSAIDs and benadryl. Advised him to return with any worsening swelling,  increased pain, skin color change, streaking or fever. Follow-up with PCP. Stable for discharge. Return precautions given. Patient states understanding of treatment care plan and is agreeable.  Kathrynn Speed, PA-C 05/09/15 0272  Devoria Albe, MD 05/09/15 [phone]

## 2015-05-09 NOTE — ED Notes (Signed)
Patient was sitting outside and got bit on left hand by a spider per patient. Left hand is swollen.

## 2015-06-12 ENCOUNTER — Emergency Department (HOSPITAL_COMMUNITY)
Admission: EM | Admit: 2015-06-12 | Discharge: 2015-06-13 | Disposition: A | Discharge status: Other Acute Inpatient Hospital | Payer: Medicaid Other | Attending: Emergency Medicine | Admitting: Emergency Medicine

## 2015-06-12 ENCOUNTER — Encounter (HOSPITAL_COMMUNITY): Payer: Self-pay | Admitting: Family Medicine

## 2015-06-12 DIAGNOSIS — Z79899 Other long term (current) drug therapy: Secondary | ICD-10-CM | POA: Insufficient documentation

## 2015-06-12 DIAGNOSIS — Z87891 Personal history of nicotine dependence: Secondary | ICD-10-CM | POA: Insufficient documentation

## 2015-06-12 DIAGNOSIS — R440 Auditory hallucinations: Secondary | ICD-10-CM | POA: Insufficient documentation

## 2015-06-12 DIAGNOSIS — G40909 Epilepsy, unspecified, not intractable, without status epilepticus: Secondary | ICD-10-CM | POA: Insufficient documentation

## 2015-06-12 DIAGNOSIS — R45851 Suicidal ideations: Secondary | ICD-10-CM | POA: Diagnosis present

## 2015-06-12 DIAGNOSIS — F3130 Bipolar disorder, current episode depressed, mild or moderate severity, unspecified: Secondary | ICD-10-CM | POA: Insufficient documentation

## 2015-06-12 DIAGNOSIS — F1310 Sedative, hypnotic or anxiolytic abuse, uncomplicated: Secondary | ICD-10-CM | POA: Diagnosis not present

## 2015-06-12 DIAGNOSIS — Z8719 Personal history of other diseases of the digestive system: Secondary | ICD-10-CM | POA: Diagnosis not present

## 2015-06-12 DIAGNOSIS — F329 Major depressive disorder, single episode, unspecified: Secondary | ICD-10-CM

## 2015-06-12 DIAGNOSIS — F32A Depression, unspecified: Secondary | ICD-10-CM

## 2015-06-12 DIAGNOSIS — G479 Sleep disorder, unspecified: Secondary | ICD-10-CM | POA: Insufficient documentation

## 2015-06-12 DIAGNOSIS — R441 Visual hallucinations: Secondary | ICD-10-CM | POA: Diagnosis not present

## 2015-06-12 LAB — I-STAT CHEM 8, ED
BUN: 15 mg/dL (ref 6–20)
Calcium, Ion: 1.14 mmol/L (ref 1.12–1.23)
Chloride: 104 mmol/L (ref 101–111)
Creatinine, Ser: 1 mg/dL (ref 0.61–1.24)
Glucose, Bld: 118 mg/dL — ABNORMAL HIGH (ref 65–99)
HEMATOCRIT: 50 % (ref 39.0–52.0)
HEMOGLOBIN: 17 g/dL (ref 13.0–17.0)
POTASSIUM: 3.6 mmol/L (ref 3.5–5.1)
SODIUM: 140 mmol/L (ref 135–145)
TCO2: 25 mmol/L (ref 0–100)

## 2015-06-12 LAB — CBC WITH DIFFERENTIAL/PLATELET
BASOS ABS: 0 10*3/uL (ref 0.0–0.1)
Basophils Relative: 0 %
EOS ABS: 0.1 10*3/uL (ref 0.0–0.7)
EOS PCT: 1 %
HCT: 44.8 % (ref 39.0–52.0)
Hemoglobin: 15.6 g/dL (ref 13.0–17.0)
Lymphocytes Relative: 16 %
Lymphs Abs: 2 10*3/uL (ref 0.7–4.0)
MCH: 32.3 pg (ref 26.0–34.0)
MCHC: 34.8 g/dL (ref 30.0–36.0)
MCV: 92.8 fL (ref 78.0–100.0)
MONO ABS: 0.6 10*3/uL (ref 0.1–1.0)
Monocytes Relative: 5 %
NEUTROS ABS: 9.7 10*3/uL — AB (ref 1.7–7.7)
Neutrophils Relative %: 78 %
PLATELETS: 269 10*3/uL (ref 150–400)
RBC: 4.83 MIL/uL (ref 4.22–5.81)
RDW: 13.5 % (ref 11.5–15.5)
WBC: 12.5 10*3/uL — ABNORMAL HIGH (ref 4.0–10.5)

## 2015-06-12 LAB — RAPID URINE DRUG SCREEN, HOSP PERFORMED
Amphetamines: NOT DETECTED
BENZODIAZEPINES: POSITIVE — AB
Barbiturates: NOT DETECTED
Cocaine: NOT DETECTED
OPIATES: NOT DETECTED
Tetrahydrocannabinol: NOT DETECTED

## 2015-06-12 LAB — ETHANOL: Alcohol, Ethyl (B): <5 mg/dL (ref <5)

## 2015-06-12 MED ORDER — NICOTINE 21 MG/24HR TD PT24
21.0000 mg | MEDICATED_PATCH | Freq: Every day | TRANSDERMAL | Status: DC
  Administered 2015-06-12: 21 mg via TRANSDERMAL
  Filled 2015-06-12: qty 1

## 2015-06-12 MED ORDER — ZOLPIDEM TARTRATE 5 MG PO TABS
5.0000 mg | ORAL_TABLET | Freq: Every evening | ORAL | Status: DC | PRN
  Administered 2015-06-12: 5 mg via ORAL
  Filled 2015-06-12: qty 1

## 2015-06-12 MED ORDER — ONDANSETRON HCL 4 MG PO TABS: 4.0000 mg | ORAL_TABLET | Freq: Three times a day (TID) | ORAL | Status: DC | PRN

## 2015-06-12 MED ORDER — IBUPROFEN 200 MG PO TABS: 600.0000 mg | ORAL_TABLET | Freq: Three times a day (TID) | ORAL | Status: DC | PRN

## 2015-06-12 NOTE — ED Notes (Signed)
Patient states he is suicidal and today it has been constant. The last two years patient has been diagnosed with a many psychiatric illness but not taking medication. Pt denies having a plan but has cut his wrist in the past.

## 2015-06-12 NOTE — ED Provider Notes (Signed)
CSN: [id number]     Arrival date & time 06/12/15  1955 History  By signing my name below, I, Russell [name], attest that this documentation has been prepared under the direction and in the presence of Russell FavorGail Myley Bahner, NP. Electronically Signed: Octavia HeirArianna [name], ED Scribe. 06/12/2015. 8:46 PM.    No chief complaint on file.    The history is provided by the patient. No language interpreter was used.   HPI Comments: Russell [name] is a 44 y.o. male who has a hx of bipolar 1 disorder, schizophrenia, depression and anxiety presents to the Emergency Department complaining of suicidal ideations. Pt reports he has been having difficulty with paranoia, hallucinations and suicidal ideations. Pt reports having black outs and notes wishes that all of the pain would go away. He states he is tired of feeling like this. Pt notes he has been hospitalized in the past for the same symptoms. He notes he did not receive any medication after he was hospitalized due to not having any money to pay for the medication. Pt notes he does not have a plan but reports he cut his wrist about a few weeks ago. Pt notes he is scared to close his eyes and shower due to how bad he is. Pt states that he was diagnosed with tourette's when he was 44 years old and reports he has gone down hill since then. He denies illicit drugs, excessive drinking. He is unknown of his last tetanus shot.  Past Medical History  Diagnosis Date  . Hernia   . Bipolar 1 disorder   . Schizophrenia   . Depression   . PONV (postoperative nausea and vomiting)     Over twenty years ago.  Russell [name]. Anxiety   . Epilepsy     last sz 03/09/15   Past Surgical History  Procedure Laterality Date  . Ankle fracture surgery  1994 - approximate  . Ligament repair  1998    right arm/wrist   Family History  Problem Relation Age of Onset  . Diabetes Father   . Alcoholism Brother   . Cancer Maternal Grandmother   . Cancer Paternal Grandfather    Social History   Substance Use Topics  . Smoking status: Former Smoker -- 0.50 packs/day    Types: Cigarettes    Quit date: 03/22/2000  . Smokeless tobacco: Never Used  . Alcohol Use: 0.0 oz/week    0 Standard drinks or equivalent per week     Comment: 03/23/15 social    Review of Systems  Psychiatric/Behavioral: Positive for suicidal ideas, hallucinations, sleep disturbance and self-injury.      Allergies  Ultram  Home Medications   Prior to Admission medications   Medication Sig Start Date End Date Taking? Authorizing Provider  divalproex (DEPAKOTE ER) 500 MG 24 hr tablet Take 2 tablets (1,000 mg total) by mouth 2 (two) times daily. 03/23/15   Suanne MarkerVikram R Penumalli, MD  omeprazole (PRILOSEC) 40 MG capsule Take 2 capsules (80 mg total) by mouth daily. 12/25/14   Jimmy FootmanAndrea Hernandez-Gonzalez, MD   Triage vitals: BP 157/88 mmHg  Pulse 97  Temp(Src) 98.7 F (37.1 C) (Oral)  Resp 20  SpO2 96% Physical Exam  Constitutional: He appears well-developed and well-nourished. No distress.  HENT:  Head: Normocephalic and atraumatic.  Eyes: Right eye exhibits no discharge. Left eye exhibits no discharge.  Pulmonary/Chest: Effort normal. No respiratory distress.  Neurological: He is alert. Coordination normal.  Skin: No rash noted. He is not diaphoretic.  Psychiatric: He has a  normal mood and affect. His behavior is normal.  Nursing note and vitals reviewed.   ED Course  Procedures  DIAGNOSTIC STUDIES: Oxygen Saturation is 96% on RA, adequate by my interpretation.  COORDINATION OF CARE:  8:44 PM Discussed treatment plan which includes labs and consult with TTS with pt at bedside and pt agreed to plan.  Labs Review Labs Reviewed - No data to display  Imaging Review No results found. I have personally reviewed and evaluated these images and lab results as part of my medical decision-making.   EKG Interpretation None     patient's labs have been reviewed.  He is medically cleared for  evaluation  MDM   Final diagnoses:  None   I personally performed the services described in this documentation, which was scribed in my presence. The recorded information has been reviewed and is accurate.   Russell Favor, NP 06/12/15 1610  Gilda Crease, MD 06/12/15 [phone]
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
## 2015-06-12 NOTE — ED Notes (Signed)
Pt has belongings in two bags but not been searched by staff. When reporting off to Natalia LeatherwoodKatherine, Charity fundraiserN, informed her of this information and asked if she could search them. Belongings taken to TCU. Pt has been wanded.

## 2015-06-12 NOTE — Progress Notes (Signed)
[facility] - per Olegario MessierKathy, fax referral for review. [facility] - per DepewNicky, fax referral, d/c on 10/26. [facility] - per Shanda BumpsJessica, fax referral for d/c on 10/26. [facility] - left voicemail  [facility]   Lido Beachatia [name] Stecklein, [place] Disposition staff 06/12/2015 10:40 PM

## 2015-06-13 ENCOUNTER — Inpatient Hospital Stay (HOSPITAL_COMMUNITY)
Admission: EM | Admit: 2015-06-13 | Discharge: 2015-06-30 | DRG: 885 | Disposition: A | Discharge status: Home / Self Care | Payer: Medicaid Other | Source: Intra-hospital | Attending: Psychiatry | Admitting: Psychiatry

## 2015-06-13 ENCOUNTER — Encounter (HOSPITAL_COMMUNITY): Payer: Self-pay | Admitting: Psychiatry

## 2015-06-13 ENCOUNTER — Encounter (HOSPITAL_COMMUNITY): Payer: Self-pay | Admitting: Nurse Practitioner

## 2015-06-13 DIAGNOSIS — Z87891 Personal history of nicotine dependence: Secondary | ICD-10-CM

## 2015-06-13 DIAGNOSIS — F319 Bipolar disorder, unspecified: Secondary | ICD-10-CM | POA: Diagnosis present

## 2015-06-13 DIAGNOSIS — K219 Gastro-esophageal reflux disease without esophagitis: Secondary | ICD-10-CM | POA: Diagnosis present

## 2015-06-13 DIAGNOSIS — R45851 Suicidal ideations: Secondary | ICD-10-CM | POA: Diagnosis present

## 2015-06-13 DIAGNOSIS — F3130 Bipolar disorder, current episode depressed, mild or moderate severity, unspecified: Secondary | ICD-10-CM | POA: Diagnosis present

## 2015-06-13 DIAGNOSIS — F329 Major depressive disorder, single episode, unspecified: Secondary | ICD-10-CM | POA: Diagnosis present

## 2015-06-13 DIAGNOSIS — F32A Depression, unspecified: Secondary | ICD-10-CM | POA: Diagnosis present

## 2015-06-13 MED ORDER — ALUM & MAG HYDROXIDE-SIMETH 200-200-20 MG/5ML PO SUSP
30.0000 mL | ORAL | Status: DC | PRN
Start: 2015-06-13 — End: 2015-06-30
  Administered 2015-06-27: 30 mL via ORAL
  Filled 2015-06-13: qty 30

## 2015-06-13 MED ORDER — NICOTINE 21 MG/24HR TD PT24
21.0000 mg | MEDICATED_PATCH | Freq: Every day | TRANSDERMAL | Status: DC
  Administered 2015-06-13 – 2015-06-30 (×17): 21 mg via TRANSDERMAL
  Filled 2015-06-13 (×22): qty 1

## 2015-06-13 MED ORDER — PNEUMOCOCCAL VAC POLYVALENT 25 MCG/0.5ML IJ INJ: 0.5000 mL | INJECTION | INTRAMUSCULAR | Status: DC

## 2015-06-13 MED ORDER — PANTOPRAZOLE SODIUM 40 MG PO TBEC
80.0000 mg | DELAYED_RELEASE_TABLET | Freq: Every day | ORAL | Status: DC
  Administered 2015-06-14 – 2015-06-30 (×17): 80 mg via ORAL
  Filled 2015-06-13 (×15): qty 2
  Filled 2015-06-13: qty 28
  Filled 2015-06-13 (×6): qty 2
  Filled 2015-06-13: qty 1
  Filled 2015-06-13 (×2): qty 2

## 2015-06-13 MED ORDER — INFLUENZA VAC SPLIT QUAD 0.5 ML IM SUSY
0.5000 mL | PREFILLED_SYRINGE | INTRAMUSCULAR | Status: AC
  Administered 2015-06-14: 0.5 mL via INTRAMUSCULAR
  Filled 2015-06-13: qty 0.5

## 2015-06-13 MED ORDER — LORAZEPAM 0.5 MG PO TABS
0.5000 mg | ORAL_TABLET | Freq: Four times a day (QID) | ORAL | Status: DC | PRN
  Administered 2015-06-14: 0.5 mg via ORAL
  Filled 2015-06-13: qty 1

## 2015-06-13 MED ORDER — DIVALPROEX SODIUM ER 500 MG PO TB24
1000.0000 mg | ORAL_TABLET | Freq: Every morning | ORAL | Status: DC
  Administered 2015-06-13: 1000 mg via ORAL
  Filled 2015-06-13: qty 2

## 2015-06-13 MED ORDER — SERTRALINE HCL 50 MG PO TABS
50.0000 mg | ORAL_TABLET | Freq: Every day | ORAL | Status: DC
  Administered 2015-06-13 – 2015-06-14 (×2): 50 mg via ORAL
  Filled 2015-06-13 (×5): qty 1

## 2015-06-13 MED ORDER — PANTOPRAZOLE SODIUM 40 MG PO TBEC
80.0000 mg | DELAYED_RELEASE_TABLET | Freq: Every day | ORAL | Status: DC
  Administered 2015-06-13: 80 mg via ORAL
  Filled 2015-06-13: qty 2

## 2015-06-13 MED ORDER — OLANZAPINE 5 MG PO TABS
5.0000 mg | ORAL_TABLET | Freq: Every day | ORAL | Status: DC
  Administered 2015-06-13 – 2015-06-14 (×2): 5 mg via ORAL
  Filled 2015-06-13 (×5): qty 1

## 2015-06-13 MED ORDER — DIVALPROEX SODIUM ER 500 MG PO TB24
1000.0000 mg | ORAL_TABLET | Freq: Every morning | ORAL | Status: DC
  Administered 2015-06-14 – 2015-06-18 (×5): 1000 mg via ORAL
  Filled 2015-06-13 (×7): qty 2

## 2015-06-13 MED ORDER — ACETAMINOPHEN 325 MG PO TABS
650.0000 mg | ORAL_TABLET | Freq: Four times a day (QID) | ORAL | Status: DC | PRN
  Administered 2015-06-13: 650 mg via ORAL
  Filled 2015-06-13: qty 2

## 2015-06-13 MED ORDER — MAGNESIUM HYDROXIDE 400 MG/5ML PO SUSP: 30.0000 mL | Freq: Every day | ORAL | Status: DC | PRN

## 2015-06-13 NOTE — [facility] Counselor (Addendum)
Per Donell SievertSpencer Simon, PA, Pt meets inpatient criteria. Per Bunnie Pionori, AC, [facility] care of Dr Jama Flavorsobos. Pt can arrive after 10 AM.   Voluntary support paperwork completed with pt and given to [place] [facility].   Cyndie MullAnna Martavion Couper, [facility] Triage Specialist
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
## 2015-06-13 NOTE — Tx Team (Signed)
Initial Interdisciplinary Treatment Plan   PATIENT STRESSORS: Financial difficulties inability to work   PATIENT STRENGTHS: Ability for insight Active sense of humor Communication skills Motivation for treatment/growth Supportive family/friends   PROBLEM LIST: Problem List/Patient Goals Date to be addressed Date deferred Reason deferred Estimated date of resolution  "I felt unsafe" 06/13/2015  06/13/2015     "I have thoughts of jumping in front of a truck" 06/13/2015  06/13/2015     "I have really bad panic attacks" 06/13/2015  06/13/2015                                          DISCHARGE CRITERIA:  Ability to meet basic life and health needs Adequate post-discharge living arrangements Improved stabilization in mood, thinking, and/or behavior Motivation to continue treatment in a less acute level of care Need for constant or close observation no longer present Reduction of life-threatening or endangering symptoms to within safe limits  PRELIMINARY DISCHARGE PLAN: Outpatient therapy Return to previous living arrangement  PATIENT/FAMIILY INVOLVEMENT: This treatment plan has been presented to and reviewed with the patient, Russell [name].  The patient and family have been given the opportunity to ask questions and make suggestions.  Larry SierrasMiddleton, Bryttany Tortorelli P 06/13/2015, 4:57 PM

## 2015-06-13 NOTE — [facility] Group Notes (Signed)
[facility] LCSW Group Therapy 06/13/2015 1:15 PM  Type of Therapy: Group Therapy- Emotion Regulation  Participation Level: Active   Participation Quality:  Appropriate  Affect: Appropriate  Cognitive: Alert and Oriented   Insight:  Developing/Improving  Engagement in Therapy: Developing/Improving and Engaged   Modes of Intervention: Clarification, Confrontation, Discussion, Education, Exploration, Limit-setting, Orientation, Problem-solving, Rapport Building, Dance movement psychotherapisteality Testing, Socialization and Support  Summary of Progress/Problems: The topic for group today was emotional regulation. This group focused on both positive and negative emotion identification and allowed group members to process ways to identify feelings, regulate negative emotions, and find healthy ways to manage internal/external emotions. Group members were asked to reflect on a time when their reaction to an emotion led to a negative outcome and explored how alternative responses using emotion regulation would have benefited them. Group members were also asked to discuss a time when emotion regulation was utilized when a negative emotion was experienced. Pt interacted well with group members and discussed communication difficulties with his wife. He discussed how this unhealthy pattern has caused him to become apathetic in dealing with their issues. Pt was receptive to feedback.   Russell CordialLauren Carter, LCSWA 06/13/2015 3:15 PM

## 2015-06-13 NOTE — [facility] Suicide Risk Assessment (Signed)
[facility]   Nursing information obtained from:   patient and chart  Demographic factors:   44 year old man, unemployed, lives with GF Current Mental Status:   see below Loss Factors:   chronic mental illness, history of seizure disorder, which has caused inability to drive, difficulty maintaining jobs  Historical Factors:   bipolar disorder, alcohol abuse  Risk Reduction Factors:   resilience Total Time spent with patient: 45 minutes Principal Problem:  Bipolar Disorder Depressed  Diagnosis:   Patient Active Problem List   Diagnosis Date Noted  . Depression [F32.9] 06/13/2015  . Sedative, hypnotic or anxiolytic use disorder, severe, dependence (HCC) [F13.20] 12/25/2014  . 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 Alcohol use disorder, severe, dependence (HCC) [F10.20] 12/23/2014  . Tobacco use disorder [F17.200] 12/23/2014  . Seizure disorder (HCC) [G40.909] 12/23/2014  . 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 Opioid use disorder, severe, dependence (HCC) [F11.20]   . Self-inflicted laceration of wrist [S61.519A]      Continued Clinical Symptoms:    The "Alcohol Use Disorders Identification Test", Guidelines for Use in Primary Care, Second Edition.  World Science writerHealth Organization [facility](WHO). Score between 0-7:  no or low risk or alcohol related problems. Score between 8-15:  moderate risk of alcohol related problems. Score between 16-19:  high risk of alcohol related problems. Score 20 or above:  warrants further diagnostic evaluation for alcohol dependence and treatment.   CLINICAL FACTORS:   44 year old man, reports history of bipolar disorder, history of alcohol abuse - no significant drinking in several months- who presents with worsening depression, sadness, neuro- vegetative symptoms, some vague hallucinations, paranoia, and passive SI.      Psychiatric Specialty Exam: Physical Exam  ROS  Blood pressure  141/88, pulse 88, temperature 98 F (36.7 C), temperature source Oral, resp. rate 20, height 6\' 1"  (1.854 m), weight 203 lb 14.8 oz (92.5 kg).Body mass index is 26.91 kg/(m^2).   See admit note MSE                                                        COGNITIVE FEATURES THAT CONTRIBUTE TO RISK:  Closed-mindedness and Loss of executive function    SUICIDE RISK:   Moderate:  Frequent suicidal ideation with limited intensity, and duration, some specificity in terms of plans, no associated intent, good self-control, limited dysphoria/symptomatology, some risk factors present, and identifiable protective factors, including available and accessible social support.  PLAN OF CARE: Patient will be admitted to inpatient psychiatric unit for stabilization and safety. Will provide and encourage milieu participation. Provide medication management and maked adjustments as needed.  Will follow daily.    Medical Decision Making:  Review of Psycho-Social Stressors (1), Review or order clinical lab tests (1), Established Problem, Worsening (2) and Review of New Medication or Change in Dosage (2)  I certify that inpatient services furnished can reasonably be expected to improve the patient's condition.   COBOS, FERNANDO 06/13/2015, 3:27 PM

## 2015-06-13 NOTE — BH Assessment (Signed)
[facility]  Berneice Heinrichina Tate, RN, [facility]'s [facility].  Pt is to be admitted to 405-2.  Pt's nurse has been notified.  Doylene Canninghomas Clemence Stillings, MA Triage Specialist [phone]

## 2015-06-13 NOTE — Progress Notes (Signed)
Patient ID: Russell SessionsChristopher [name], male   DOB: 01/05/1971, 44 y.o.   MRN: [id number]  Admission Note:  D- 44 yr old male who presents Vol in no acute distress for the treatment of SI, Depression, and Paranoia. Pt appears anxious and depressed. Pt was calm and cooperative with admission process. Pt presents with passive SI and contracts for safety upon admission. Pt reports AVH reporting that occasionally he hears "bombs going off in his head that is so loud it causes his ears to ring".  Pt c/o increased depression, anxiety, and panic attacks. Pt also reports seeing shadows and when he turns to look "no one is ever there".  Pt has hx of epilepsy with his last episode 3 months ago due to "trying to cut back on the depakote to make it last longer because I couldn't get it refilled".  He has hx of prior suicide attempts, family hx of suicide, and hx of substance abuse. Pt reports stressor of not being able to work due to his epilepsy which makes him feel "worthless and hopeless".  Transportation is also an issue due to the epilepsy.    Pt currently lives with his significant other of 12 years and her 44 year old daughter. A- Skin was assessed and found to be clear of any abnormal marks apart from healing scratches on his arms that pt states came from "rough playing with my dog".  Pt searched and no contraband found, POC and unit policies explained and understanding verbalized. Consents obtained. Pt had no additional questions or concerns. R- Patient receptive, calm, and cooperative. Patient remains safe at this time.

## 2015-06-13 NOTE — BHH Counselor (Signed)
Adult Comprehensive Assessment  Patient ID: Russell [name], male DOB: 1970-12-26, 44 y.o. MRN: [id number]  Information Source: Information source: Patient  Current Stressors:  Educational / Learning stressors: N/A Employment / Job issues: Pt can no longer work due to his epilepsy and mental illnesses Family Relationships: some conflict with with Surveyor, quantity / Lack of resources (include bankruptcy): N/A Housing / Lack of housing: N/A Physical health (include injuries & life threatening diseases): blackout and seizures; Diagnosed epilepsy 2 years ago Social relationships: Isolates self, lacks strong social support system Substance abuse: Pain medications 5 mg Hydrocodone 3 times a day, on and off pain medications for 20 years Bereavement / Loss: N/A  Living/Environment/Situation:  Living Arrangements: Spouse/significant other;Children How long has patient lived in current situation?: 11 years What is atmosphere in current home: Comfortable   Family History:  Marital status: Married Number of Years Married: 11 What types of issues is patient dealing with in the relationship?: some conflict but overall "steady" relationship Does patient have children?: Yes How many children?: 2 How is patient's relationship with their children?: reports fair relationship with both children.   Childhood History:  By whom was/is the patient raised?: Both parents Description of patient's relationship with caregiver when they were a child: Not close with parents, spent most of his days in athletic sports Patient's description of current relationship with people who raised him/her: Good relationship with parents Does patient have siblings?: Yes Number of Siblings: 2 Description of patient's current relationship with siblings: gets along with both but distant relationship Did patient suffer any verbal/emotional/physical/sexual abuse as a child?: No Did patient suffer from severe childhood  neglect?: No Has patient ever been sexually abused/assaulted/raped as an adolescent or adult?: No Was the patient ever a victim of a crime or a disaster?: No Witnessed domestic violence?: No Has patient been effected by domestic violence as an adult?: No  Education:  Highest grade of school patient has completed: 8th grade Currently a student?: No Learning disability?: Yes What learning problems does patient have?: ADHD  Employment/Work Situation:  Employment situation: Unemployed Patient's job has been impacted by current illness: N/A Describe how patient's job has been impacted: Lost previous job due to seizures What is the longest time patient has a held a job?: 8 years 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 Has patient ever been in the [employer]?: No Has patient ever served in combat?: No  Financial Resources:  Financial resources: Income from employment;Income from spouse; [facility] Medicaid Does patient have a representative payee or guardian?: No  Alcohol/Substance Abuse:  What has been your use of drugs/alcohol within the last 12 months?: Pt denies If attempted suicide, did drugs/alcohol play a role in this?: N/A Alcohol/Substance Abuse Treatment Hx: Denies past history Has alcohol/substance abuse ever caused legal problems?: Yes (DUI when 33)  Social Support System:  Conservation officer, nature Support System: Fair Museum/gallery exhibitions officer System: Mom, dad, wife Type of faith/religion: Uknown How does patient's faith help to cope with current illness?: Unknown  Leisure/Recreation:  Leisure and Hobbies: Systems developer, run, exercise  Strengths/Needs:  What things does the patient do well?: Sports, athletic  In what areas does patient struggle / problems for patient: being overmedicated; epilepsy  Discharge Plan:  Does patient have access to transportation?: Yes- wife helps him to get around Will patient be returning to same living  situation after discharge?: Yes Currently receiving community mental health services: No, If no, would patient like referral for services when discharged?: Yes  Does patient have financial barriers related to discharge medications?: No  Summary/Recommendations:  Patient is a 44 year old Caucasian male with a Hx of Schizoaffective disorder. Pt reports that his depression and anxiety has been worsening at home. He expresses ongoing paranoia that someone is "out to get him." Pt lives at home with his wife; unable to work due to medical and mental health issues. He is not currently seeing an outpatient provider and would like a referral. Pt is pleasant during assessment, forthcoming with information expressing motivation to "feel better." Patient will benefit from crisis stabilization, medication evaluation, group therapy and psycho education in addition to case management for discharge planning.   Chad CordialLauren Carter, LCSWA Clinical Social Work [phone]

## 2015-06-14 LAB — LIPID PANEL
Cholesterol: 237 mg/dL — ABNORMAL HIGH (ref 0–200)
HDL: 63 mg/dL (ref >40)
LDL CALC: 132 mg/dL — AB (ref 0–99)
TRIGLYCERIDES: 211 mg/dL — AB (ref <150)
Total CHOL/HDL Ratio: 3.8 RATIO
VLDL: 42 mg/dL — AB (ref 0–40)

## 2015-06-14 LAB — TSH: TSH: 2.32 u[IU]/mL (ref 0.350–4.500)

## 2015-06-14 MED ORDER — SERTRALINE HCL 100 MG PO TABS
100.0000 mg | ORAL_TABLET | Freq: Every day | ORAL | Status: DC
  Administered 2015-06-15 – 2015-06-18 (×4): 100 mg via ORAL
  Filled 2015-06-14 (×6): qty 1

## 2015-06-14 NOTE — BHH Suicide Risk Assessment (Deleted)
B[facility] INPATIENT:  Family/Significant Other Suicide Prevention Education  Suicide Prevention Education:  Education Completed; Angelene GiovanniDarlene Schadt, Pt;s mother [phone], has been identified by the patient as the family member/significant other with whom the patient will be residing, and identified as the person(s) who will aid the patient in the event of a mental health crisis (suicidal ideations/suicide attempt).  With written consent from the patient, the family member/significant other has been provided the following suicide prevention education, prior to the and/or following the discharge of the patient.  The suicide prevention education provided includes the following:  Suicide risk factors  Suicide prevention and interventions  National Suicide Hotline telephone number  [facility] assessment telephone number  [facility] Emergency Assistance 911  [facility] and/or [facility]idential [facility] Crisis Unit telephone number  Request made of family/significant other to:  Remove weapons (e.g., guns, rifles, knives), all items previously/currently identified as safety concern.    Remove drugs/medications (over-the-counter, prescriptions, illicit drugs), all items previously/currently identified as a safety concern.  The family member/significant other verbalizes understanding of the suicide prevention education information provided.  The family member/significant other agrees to remove the items of safety concern listed above.  Elaina Hoopsarter, Feven Alderfer M 06/14/2015, 4:26 PM

## 2015-06-14 NOTE — [facility] Group Notes (Addendum)
[facility] 06/14/2015 1:15pm  Type of Therapy: Mental Health Association Presentation  Participation Level: Active  Participation Quality: Attentive  Affect: Appropriate  Cognitive: Oriented  Insight: Developing/Improving  Engagement in Therapy: Engaged  Modes of Intervention: Discussion, Education and Socialization  Summary of Progress/Problems: Mental Health Association (MHA) Speaker came to talk about his personal journey with substance abuse and addiction. The pt processed ways by which to relate to the speaker. MHA speaker provided handouts and educational information pertaining to groups and services offered by the [facility]. Pt was engaged in speaker's presentation and was receptive to resources provided.    Chad CordialLauren Carter, LCSWA 06/14/2015 1:47 PM

## 2015-06-14 NOTE — Plan of Care (Signed)
Problem: Diagnosis: Increased Risk For Suicide Attempt Goal: STG-Patient Will Attend All Groups On The Unit Outcome: Progressing Attended evening group on 06/13/15     

## 2015-06-14 NOTE — B[facility] Suicide Risk Assessment (Signed)
[facility] INPATIENT:  Family/Significant Other Suicide Prevention Education  Suicide Prevention Education:  Patient Refusal for Family/Significant Other Suicide Prevention Education: The patient Russell [name] has refused to provide written consent for family/significant other to be provided Family/Significant Other Suicide Prevention Education during admission and/or prior to discharge. SPE reviewed with patient and brochure provided. Patient encouraged to return to hospital if having suicidal thoughts, patient verbalized his/her understanding and has no further questions at this time.  Physician notified.  Elaina Hoopsarter, Roran Wegner M 06/14/2015, 4:27 PM

## 2015-06-14 NOTE — Tx Team (Signed)
Interdisciplinary Treatment Plan Update (Adult) Date: 06/14/2015   Date: 06/14/2015 1:33 PM  Progress in Treatment:  Attending groups: Yes  Participating in groups: Yes  Taking medication as prescribed: Yes  Tolerating medication: Yes  Family/Significant othe contact made: No, CSW assessing for appropriate contact Patient understands diagnosis: Yes Discussing patient identified problems/goals with staff: Yes  Medical problems stabilized or resolved: Yes  Denies suicidal/homicidal ideation: Yes Patient has not harmed self or Others: Yes   New problem(s) identified: None identified at this time.   Discharge Plan or Barriers: Pt will return home and follow-up with outpatient resources  Additional comments: n/a   Reason for Continuation of Hospitalization:  Anxiety Depression Medication stabilization Suicidal ideation  Estimated length of stay: 3-5 days  Review of initial/current patient goals per problem list:   1.  Goal(s): Patient will participate in aftercare plan  Met:  Yes  Target date: 3-5 days from date of admission   As evidenced by: Patient will participate within aftercare plan AEB aftercare provider and housing plan at discharge being identified.   06/14/15: Pt will return home and follow-up with outpatient resources  2.  Goal (s): Patient will exhibit decreased depressive symptoms and suicidal ideations.  Met:  No  Target date: 3-5 days from date of admission   As evidenced by: Patient will utilize self rating of depression at 3 or below and demonstrate decreased signs of depression or be deemed stable for discharge by MD.  06/14/15: Pt rates depression at 6/10; denies SI  3.  Goal(s): Patient will demonstrate decreased signs and symptoms of anxiety.  Met:  No  Target date: 3-5 days from date of admission   As evidenced by: Patient will utilize self rating of anxiety at 3 or below and demonstrated decreased signs of anxiety, or be deemed stable for  discharge by MD  06/14/15: Pt rates anxiety at 7/10.  Attendees:  Patient:    Family:    Physician: Dr. Parke Poisson, MD  06/14/2015 1:33 PM  Nursing: Lars Pinks, RN Case manager  06/14/2015 1:33 PM  Clinical Social Worker Norman Clay, MSW 06/14/2015 1:33 PM  Other: Lucinda Dell, Beverly Sessions Liasion 06/14/2015 1:33 PM  Clinical: Sandre Kitty RN 06/14/2015 1:33 PM  Other: , RN Charge Nurse 06/14/2015 1:33 PM  Other:     Peri Maris, Latanya Presser MSW

## 2015-06-14 NOTE — Progress Notes (Signed)
[facility] MD Progress Note  06/14/2015 3:27 PM Russell [name]  MRN:  [id number] Subjective:  Patient reports ongoing depression, sadness. Denies medication side effects. Objective :  I have reviewed case with treatment team and have met with patient. Patient remains depressed, sad, with a constricted affect. He reports, however, some improvement compared to admission- states he is not feeling paranoid today and denies any hallucinations today. Does not appear internally preoccupied . Also, states he slept better last night, and states " it is the best night sleep I have had in a while". Patient has been visible on unit, going to groups. No disruptive or agitated behaviors on unit. Denies medication side effects at this time. As noted in admission note, in addition to his history of mood disorder, he also describes a history suggestive of pathological gambling. Particularly after he stopped drinking alcohol , he started gambling online, and states he stopped recently only because he " lost a bunch of money gambling  ran out of money to continue . He states he realizes it is " a problem, something I need to stop".  Responds to support, encouragement , and affect improves partially as session progresses . Lipid panel- mild hypercholesterolemia. TSH WNL.  Principal Problem: Bipolar I disorder, most recent episode depressed ([place]) Diagnosis:   Patient Active Problem List   Diagnosis Date Noted  . Depression [F32.9] 06/13/2015  . Bipolar I disorder, most recent episode depressed ([place]) [F31.30] 06/13/2015  . 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 Severe major depression without psychotic features ([place]) [F32.2] 12/23/2014  . Alcohol use disorder, severe, dependence ([place]) [F10.20] 12/23/2014  . Tobacco use disorder [F17.200] 12/23/2014  . Seizure disorder ([place]) [I33.825] 12/23/2014  . Stimulant use disorder ([place]) [F15.90]   . Opioid use disorder,  severe, dependence ([place]) [F11.20]   . Self-inflicted laceration of wrist [S61.519A]    Total Time spent with patient: 25 minutes     Past Medical History:  Past Medical History  Diagnosis Date  . Hernia   . Bipolar 1 disorder ([place])   . Schizophrenia ([place])   . Depression   . PONV (postoperative nausea and vomiting)     Over twenty years ago.  Marland Kitchen Anxiety   . Epilepsy ([place])     last sz 03/09/15    Past Surgical History  Procedure Laterality Date  . Ankle fracture surgery  1994 - approximate  . Ligament repair  1998    right arm/wrist   Family History:  Family History  Problem Relation Age of Onset  . Diabetes Father   . Alcoholism Brother   . Cancer Maternal Grandmother   . Cancer Paternal Grandfather    Social History:  History  Alcohol Use  . 0.0 oz/week  . 0 Standard drinks or equivalent per week    Comment: Drinks: 2-3 month. Last drink: Saturday     History  Drug Use No    Social History   Social History  . Marital Status: Single    Spouse Name: [name]  . Number of Children: 2  . Years of Education: 7   Occupational History  .      [employer]   Social History Main Topics  . Smoking status: Former Smoker -- 0.50 packs/day    Types: Cigarettes    Quit date: 03/22/2000  . Smokeless tobacco: Never Used  . Alcohol Use: 0.0 oz/week    0 Standard drinks or equivalent per week  Comment: Drinks: 2-3 month. Last drink: Saturday  . Drug Use: No  . Sexual Activity: Not Asked   Other Topics Concern  . None   Social History Narrative   Lives at home with wife, child   No caffeine use   Additional Social History:   Sleep: improved   Appetite:  improved   Current Medications: Current Facility-Administered Medications  Medication Dose Route Frequency Provider Last Rate Last Dose  . acetaminophen (TYLENOL) tablet 650 mg  650 mg Oral Q6H PRN Kerrie Buffalo, NP   650 mg at 06/13/15 2117  . alum & mag hydroxide-simeth (MAALOX/MYLANTA)  200-200-20 MG/5ML suspension 30 mL  30 mL Oral Q4H PRN Kerrie Buffalo, NP      . divalproex (DEPAKOTE ER) 24 hr tablet 1,000 mg  1,000 mg Oral q morning - 10a Kerrie Buffalo, NP   1,000 mg at 06/14/15 1327  . LORazepam (ATIVAN) tablet 0.5 mg  0.5 mg Oral Q6H PRN Myer Peer Krishna Heuer, MD      . magnesium hydroxide (MILK OF MAGNESIA) suspension 30 mL  30 mL Oral Daily PRN Kerrie Buffalo, NP      . nicotine (NICODERM CQ - dosed in mg/24 hours) patch 21 mg  21 mg Transdermal Daily Jenne Campus, MD   21 mg at 06/14/15 1128  . OLANZapine (ZYPREXA) tablet 5 mg  5 mg Oral QHS Jenne Campus, MD   5 mg at 06/13/15 2117  . pantoprazole (PROTONIX) EC tablet 80 mg  80 mg Oral Daily Kerrie Buffalo, NP   80 mg at 06/14/15 0626  . pneumococcal 23 valent vaccine (PNU-IMMUNE) injection 0.5 mL  0.5 mL Intramuscular Tomorrow-1000 Jenne Campus, MD      . Derrill Memo ON 06/15/2015] sertraline (ZOLOFT) tablet 100 mg  100 mg Oral Daily Jenne Campus, MD        Lab Results:  Results for orders placed or performed during the hospital encounter of 06/13/15 (from the past 48 hour(s))  Lipid panel     Status: Abnormal   Collection Time: 06/14/15  6:50 AM  Result Value Ref Range   Cholesterol 237 (H) 0 - 200 mg/dL   Triglycerides 211 (H) <150 mg/dL   HDL 63 >40 mg/dL   Total CHOL/HDL Ratio 3.8 RATIO   VLDL 42 (H) 0 - 40 mg/dL   LDL Cholesterol 132 (H) 0 - 99 mg/dL    Comment:        Total Cholesterol/HDL:CHD Risk Coronary Heart Disease Risk Table                     Men   Women  1/2 Average Risk   3.4   3.3  Average Risk       5.0   4.4  2 X Average Risk   9.6   7.1  3 X Average Risk  23.4   11.0        Use the calculated Patient Ratio above and the CHD Risk Table to determine the patient's CHD Risk.        ATP III CLASSIFICATION (LDL):  <100     mg/dL   Optimal  100-129  mg/dL   Near or Above                    Optimal  130-159  mg/dL   Borderline  160-189  mg/dL   High  >190     mg/dL   Very  High Performed at [facility]   TSH     Status: None   Collection Time: 06/14/15  6:50 AM  Result Value Ref Range   TSH 2.320 0.350 - 4.500 uIU/mL    Comment: Performed at [facility]    Physical Findings: AIMS: Facial and Oral Movements Muscles of Facial Expression: None, normal Lips and Perioral Area: None, normal Jaw: None, normal Tongue: None, normal,Extremity Movements Upper (arms, wrists, hands, fingers): None, normal Lower (legs, knees, ankles, toes): None, normal, Trunk Movements Neck, shoulders, hips: None, normal, Overall Severity Severity of abnormal movements (highest score from questions above): None, normal Incapacitation due to abnormal movements: None, normal Patient's awareness of abnormal movements (rate only patient's report): No Awareness, Dental Status Current problems with teeth and/or dentures?: No Does patient usually wear dentures?: No  CIWA:    COWS:     Musculoskeletal: Strength & Muscle Tone: within normal limits Gait & Station: normal Patient leans: N/A  Psychiatric Specialty Exam: ROS denies headache, denies shortness of breath, denies chest pain, no vomiting   Blood pressure 130/82, pulse 61, temperature 97.4 F (36.3 C), temperature source Oral, resp. rate 18, height 6' 1"  (1.854 m), weight 203 lb 14.8 oz (92.5 kg).Body mass index is 26.91 kg/(m^2).  General Appearance: Fairly Groomed  Engineer, water::  Good  Speech:  Normal Rate  Volume:  Normal  Mood:  Depressed  Affect:  constricted, but slightly more reactive today  Thought Process:  Linear  Orientation:  Full (Time, Place, and Person)  Thought Content:  denies hallucinations today, not internally preoccupied or paranoid, no delusions expressed   Suicidal Thoughts:  No- at this time denies any plan or intention of hurting self or of SI- contracts for safety on unit  Homicidal Thoughts:  No  Memory:  recent and remote grossly intact   Judgement:  Fair   Insight:  Present  Psychomotor Activity:  Normal  Concentration:  Good  Recall:  Good  Fund of Knowledge:Good  Language: Good  Akathisia:  Negative  Handed:  Right  AIMS (if indicated):     Assets:  Communication Skills Desire for Improvement Resilience  ADL's:  Intact  Cognition: WNL  Sleep:     Assessment - patient remains depressed, sad, constricted in affect. He is somewhat more reactive in affect compared to admission , psychotic symptoms and vague paranoia have resolved, and he is sleeping better. He is visible on the unit, behavior in good control. In addition to mood disorder, depression, also describes pathological gambling symptoms.  He is tolerating Zyprexa , Depakote ER,and  Zoloft well .  Treatment Plan Summary: Daily contact with patient to assess and evaluate symptoms and progress in treatment, Medication management, Plan inpatient admission and medications as below Encourage ongoing milieu participation to work on coping skills and symptom reduction Continue Depakote ER 100 mgrs QDAY for history of mood disorder and history of seizure disorder  Continue Zoloft 100 mgrs QDAY for management of depression and anxiety Continue Zyprexa 5 mgrs QHS for management of mood disorder, depression, psychotic symptoms and to help improve sleep Continue Ativan 0.5 mgrs Q 6 hours PRN for anxiety Cayleen Benjamin 06/14/2015, 3:27 PM

## 2015-06-14 NOTE — Progress Notes (Signed)
    Russell [name] is doing fairly well today..he takes  His medications as planned...he initially did not want to get OOB because he thought his breakfast had made him sick. By lunchtime, he got OOB, got up and took his morning medicaitons late.    A HE denied active SI and was encouraged to complete his daily assessment.    R Safety in place.

## 2015-06-14 NOTE — Progress Notes (Signed)
Spoke with pt right before karaoke group.  Pt was trying to decide whether he wanted to go to group or not.  Writer was able to convince pt to go and just listen, even if he did not want to sing.  Pt was pleasant in conversation.  He states he is still having some anxiety and passive thoughts of suicide, but he can contract for safety.  He denies HI/AVH.  He states he is working with the counselor on his discharge plans.  He has been going to groups.  Pt makes his needs known to staff.  Support and encouragement offered.  Safety maintained with q15 minute checks.

## 2015-06-14 NOTE — Progress Notes (Signed)
D: Pt has appropriate affect and depressed mood.  He reports he hopes to "get out of what I'm going through and formulate some type of plan while I'm here."  Pt denies SI/HI, denies hallucinations, reports abdominal pain of 3/10.  Pt has been visible in milieu interacting with peers and staff appropriately.  Pt attended evening group.   A: Introduced self to pt.  Met with pt 1:1 and provided support and encouragement.  Actively listened to pt.  Medications administered per order.  PRN medication administered for pain. R: Pt is compliant with medications.  Pt verbally contracts for safety.  Will continue to monitor and assess.

## 2015-06-14 NOTE — Progress Notes (Signed)
Pt attended karaoke group this evening.  

## 2015-06-15 LAB — HEMOGLOBIN A1C
Hgb A1c MFr Bld: 5.7 % — ABNORMAL HIGH (ref 4.8–5.6)
Mean Plasma Glucose: 117 mg/dL

## 2015-06-15 MED ORDER — LORAZEPAM 1 MG PO TABS
1.0000 mg | ORAL_TABLET | Freq: Two times a day (BID) | ORAL | Status: DC | PRN
  Administered 2015-06-15 – 2015-06-16 (×2): 1 mg via ORAL
  Filled 2015-06-15 (×2): qty 1

## 2015-06-15 MED ORDER — OLANZAPINE 7.5 MG PO TABS
7.5000 mg | ORAL_TABLET | Freq: Every day | ORAL | Status: DC
  Administered 2015-06-15 – 2015-06-21 (×7): 7.5 mg via ORAL
  Filled 2015-06-15 (×10): qty 1

## 2015-06-15 MED ORDER — LORAZEPAM 0.5 MG PO TABS: 0.5000 mg | ORAL_TABLET | Freq: Four times a day (QID) | ORAL | Status: DC | PRN

## 2015-06-15 NOTE — B[facility] Group Notes (Signed)
[facility] LCSW Aftercare Discharge Planning Group Note  06/15/2015  8:45 AM  Participation Quality: Did Not Attend. Patient invited to participate but declined.  Garnet Chatmon, MSW, LCSWA Clinical Social Worker [facility] [phone]   

## 2015-06-15 NOTE — [facility] Group Notes (Signed)
[facility] LCSW Group Therapy 06/15/2015 1:15 PM Type of Therapy: Group Therapy Participation Level: Minimal  Participation Quality: Attentive  Affect: Depressed and Flat  Cognitive: Alert and Oriented  Insight: Developing/Improving and Engaged  Engagement in Therapy: Developing/Improving and Engaged  Modes of Intervention: Clarification, Confrontation, Discussion, Education, Exploration, Limit-setting, Orientation, Problem-solving, Rapport Building, Dance movement psychotherapisteality Testing, Socialization and Support  Summary of Progress/Problems: The topic for today was feelings about relapse. Pt discussed what relapse prevention is to them and identified triggers that they are on the path to relapse. Pt processed their feeling towards relapse and was able to relate to peers. Pt discussed coping skills that can be used for relapse prevention. Patient participated minimally in group discussion despite CSW encouragement but was observed actively listening during conversation.   Samuella BruinKristin Alylah Blakney, MSW, [employer]CSWA Clinical Social Worker [facility] [phone]

## 2015-06-15 NOTE — Progress Notes (Signed)
Russell [name] is quiet, subdued and flat. He slept until after 1100 today and stated , when he got up, that he had trouble sleeping  ( again ) last night .    A He has no new complaints. HE says " I don't cause trouble"...I just try to get along...". He attends his afternoon groups and he goes outside with the patients when they are allowed time outside. He says " I don't know when I'm going to feel better....hope its soon" He is encouraged to complete his daily assessment.   R Safety in place.

## 2015-06-15 NOTE — Progress Notes (Addendum)
Patient ID: Russell [name], male   DOB: 08-04-1971, 44 y.o.   MRN: [id number] [facility] MD Progress Note  06/15/2015 4:18 PM Russell [name]  MRN:  [id number] Subjective:  Patient reports he is starting to feel slightly better, although still depressed, sad. Complains of ongoing insomnia. Sleep has remained fair, in spite of current Zyprexa dose . He does report a chronic history of insomnia, even prior to onset of depression. Denies medication  Side effects, except for some blurry vision. Objective :  I have reviewed case with treatment team and have met with patient. Still depressed, sad, constricted in affect but states " I am starting to feel somewhat better, like there is a more hopeful person wanting to come out ". Denies any current psychotic symptoms and states that the " explosions in my head" which he had been experiencing have  Now subsided .  Patient has been visible on unit, going to some  groups. No disruptive or agitated behaviors on unit. As noted, describes some blurry vision related to medications , no other side effects reported. No excessive sedation, no EPS, no akathisia from Zyprexa dose . More responsive to support, encouragement .   Principal Problem: Bipolar I disorder, most recent episode depressed ([place]) Diagnosis:   Patient Active Problem List   Diagnosis Date Noted  . Depression [F32.9] 06/13/2015  . Bipolar I disorder, most recent episode depressed ([place]) [F31.30] 06/13/2015  . Sedative, hypnotic or anxiolytic use disorder, severe, dependence ([place]) [F13.20] 12/25/2014  . Severe major depression without psychotic features ([place]) [F32.2] 12/23/2014  . Alcohol use disorder, severe, dependence ([place]) [F10.20] 12/23/2014  . Tobacco use disorder [F17.200] 12/23/2014  . Seizure disorder ([place]) [A35.573] 12/23/2014  . Stimulant use disorder ([place]) [F15.90]   . Opioid use disorder, severe, dependence ([place]) [F11.20]   . Self-inflicted laceration of wrist [S61.519A]    Total  Time spent with patient: 25 minutes     Past Medical History:  Past Medical History  Diagnosis Date  . Hernia   . Bipolar 1 disorder ([place])   . Schizophrenia ([place])   . Depression   . PONV (postoperative nausea and vomiting)     Over twenty years ago.  Marland Kitchen Anxiety   . Epilepsy ([place])     last sz 03/09/15    Past Surgical History  Procedure Laterality Date  . Ankle fracture surgery  1994 - approximate  . Ligament repair  1998    right arm/wrist   Family History:  Family History  Problem Relation Age of Onset  . Diabetes Father   . Alcoholism Brother   . Cancer Maternal Grandmother   . Cancer Paternal Grandfather    Social History:  History  Alcohol Use  . 0.0 oz/week  . 0 Standard drinks or equivalent per week    Comment: Drinks: 2-3 month. Last drink: Saturday     History  Drug Use No    Social History   Social History  . Marital Status: Single    Spouse Name: [name]  . Number of Children: 2  . Years of Education: 7   Occupational History  .      [employer]   Social History Main Topics  . Smoking status: Former Smoker -- 0.50 packs/day    Types: Cigarettes    Quit date: 03/22/2000  . Smokeless tobacco: Never Used  . Alcohol Use: 0.0 oz/week    0 Standard drinks or equivalent per week     Comment: Drinks: 2-3 month. Last drink:  Saturday  . Drug Use: No  . Sexual Activity: Not Asked   Other Topics Concern  . None   Social History Narrative   Lives at home with wife, child   No caffeine use   Additional Social History:   Sleep:  Fair   Appetite:  improved  describes it as " getting better "  Current Medications: Current Facility-Administered Medications  Medication Dose Route Frequency Provider Last Rate Last Dose  . acetaminophen (TYLENOL) tablet 650 mg  650 mg Oral Q6H PRN Kerrie Buffalo, NP   650 mg at 06/13/15 2117  . alum & mag hydroxide-simeth (MAALOX/MYLANTA) 200-200-20 MG/5ML suspension 30 mL  30 mL Oral Q4H PRN Kerrie Buffalo, NP       . divalproex (DEPAKOTE ER) 24 hr tablet 1,000 mg  1,000 mg Oral q morning - 10a Kerrie Buffalo, NP   1,000 mg at 06/15/15 1110  . LORazepam (ATIVAN) tablet 0.5 mg  0.5 mg Oral Q6H PRN Jenne Campus, MD   0.5 mg at 06/14/15 2211  . magnesium hydroxide (MILK OF MAGNESIA) suspension 30 mL  30 mL Oral Daily PRN Kerrie Buffalo, NP      . nicotine (NICODERM CQ - dosed in mg/24 hours) patch 21 mg  21 mg Transdermal Daily Jenne Campus, MD   21 mg at 06/15/15 1110  . OLANZapine (ZYPREXA) tablet 5 mg  5 mg Oral QHS Jenne Campus, MD   5 mg at 06/14/15 2209  . pantoprazole (PROTONIX) EC tablet 80 mg  80 mg Oral Daily Kerrie Buffalo, NP   80 mg at 06/15/15 0644  . pneumococcal 23 valent vaccine (PNU-IMMUNE) injection 0.5 mL  0.5 mL Intramuscular Tomorrow-1000 Myer Peer Camila [place], MD      . sertraline (ZOLOFT) tablet 100 mg  100 mg Oral Daily Jenne Campus, MD   100 mg at 06/15/15 1141    Lab Results:  Results for orders placed or performed during the hospital encounter of 06/13/15 (from the past 48 hour(s))  Hemoglobin A1c     Status: Abnormal   Collection Time: 06/14/15  6:50 AM  Result Value Ref Range   Hgb A1c MFr Bld 5.7 (H) 4.8 - 5.6 %    Comment: (NOTE)         Pre-diabetes: 5.7 - 6.4         Diabetes: >6.4         Glycemic control for adults with diabetes: <7.0    Mean Plasma Glucose 117 mg/dL    Comment: (NOTE) Performed At: [facility] [place], [place] [id number] Lindon Romp MD [id number] Performed at [facility]   Lipid panel     Status: Abnormal   Collection Time: 06/14/15  6:50 AM  Result Value Ref Range   Cholesterol 237 (H) 0 - 200 mg/dL   Triglycerides 211 (H) <150 mg/dL   HDL 63 >40 mg/dL   Total CHOL/HDL Ratio 3.8 RATIO   VLDL 42 (H) 0 - 40 mg/dL   LDL Cholesterol 132 (H) 0 - 99 mg/dL    Comment:        Total Cholesterol/HDL:CHD Risk Coronary Heart Disease Risk Table                     Men   Women  1/2  Average Risk   3.4   3.3  Average Risk       5.0   4.4  2 X Average Risk  9.6   7.1  3 X Average Risk  23.4   11.0        Use the calculated Patient Ratio above and the CHD Risk Table to determine the patient's CHD Risk.        ATP III CLASSIFICATION (LDL):  <100     mg/dL   Optimal  100-129  mg/dL   Near or Above                    Optimal  130-159  mg/dL   Borderline  160-189  mg/dL   High  >190     mg/dL   Very High Performed at [facility]   TSH     Status: None   Collection Time: 06/14/15  6:50 AM  Result Value Ref Range   TSH 2.320 0.350 - 4.500 uIU/mL    Comment: Performed at [facility]    Physical Findings: AIMS: Facial and Oral Movements Muscles of Facial Expression: None, normal Lips and Perioral Area: None, normal Jaw: None, normal Tongue: None, normal,Extremity Movements Upper (arms, wrists, hands, fingers): None, normal Lower (legs, knees, ankles, toes): None, normal, Trunk Movements Neck, shoulders, hips: None, normal, Overall Severity Severity of abnormal movements (highest score from questions above): None, normal Incapacitation due to abnormal movements: None, normal Patient's awareness of abnormal movements (rate only patient's report): No Awareness, Dental Status Current problems with teeth and/or dentures?: No Does patient usually wear dentures?: No  CIWA:    COWS:     Musculoskeletal: Strength & Muscle Tone: within normal limits Gait & Station: normal Patient leans: N/A  Psychiatric Specialty Exam: ROS denies headache, denies shortness of breath, denies chest pain, no vomiting   Blood pressure 133/85, pulse 70, temperature 97.5 F (36.4 C), temperature source Oral, resp. rate 16, height $RemoveBe'6\' 1"'CSyVirfSk$  (1.854 m), weight 203 lb 14.8 oz (92.5 kg).Body mass index is 26.91 kg/(m^2).  General Appearance: improved grooming  Eye Contact::  Good  Speech:  Normal Rate  Volume:  Normal  Mood:  Still depressed, but improving    Affect:   Still tends to be constricted   Thought Process:  Linear  Orientation:  Full (Time, Place, and Person)  Thought Content:  denies hallucinations today, not internally preoccupied or paranoid, no delusions expressed   Suicidal Thoughts:  No- at this time denies any plan or intention of hurting self or of SI- contracts for safety on unit  Homicidal Thoughts:  No  Memory:  recent and remote grossly intact   Judgement:  Fair  Insight:  Present  Psychomotor Activity:  Normal  Concentration:  Good  Recall:  Good  Fund of Knowledge:Good  Language: Good  Akathisia:  Negative  Handed:  Right  AIMS (if indicated):     Assets:  Communication Skills Desire for Improvement Resilience  ADL's:  Intact  Cognition: WNL  Sleep:     Assessment - patient  reports  Some / partial  improvement in mood, and feels less hopeless, denies any  SI at this time , no current psychotic symptoms , of note ,  no seizures since admission. ( Has history of seizure disorder ) . He remains depressed , sad, somewhat constricted in affect.  Insomnia is major symptom at this time. He is tolerating Zyprexa , Depakote ER,and  Zoloft well .  Treatment Plan Summary: Daily contact with patient to assess and evaluate symptoms and progress in treatment, Medication management, Plan inpatient admission and medications as below  Encourage ongoing milieu participation to work on Radiographer, therapeutic and symptom reduction Continue Depakote ER 1000 mgrs QDAY for history of mood disorder and history of seizure disorder  Obtain routine Valproic Acid Serum level, and routine Liver function test, as on Depakote.  Continue Zoloft 100 mgrs QDAY for management of depression and anxiety Increase Zyprexa to 7.5  mgrs QHS for management of mood disorder, depression, psychotic symptoms and to help improve sleep Increase  Ativan to 1  mgrs Q 12  hours PRN for anxiety and also for insomnia as needed  Pilot Prindle 06/15/2015, 4:18 PM

## 2015-06-16 ENCOUNTER — Encounter (HOSPITAL_COMMUNITY): Payer: Self-pay | Admitting: Registered Nurse

## 2015-06-16 DIAGNOSIS — R45851 Suicidal ideations: Secondary | ICD-10-CM

## 2015-06-16 LAB — HEPATIC FUNCTION PANEL
ALBUMIN: 4.4 g/dL (ref 3.5–5.0)
ALT: 34 U/L (ref 17–63)
AST: 25 U/L (ref 15–41)
Alkaline Phosphatase: 91 U/L (ref 38–126)
Bilirubin, Direct: <0.1 mg/dL — ABNORMAL LOW (ref 0.1–0.5)
TOTAL PROTEIN: 8.3 g/dL — AB (ref 6.5–8.1)
Total Bilirubin: 0.6 mg/dL (ref 0.3–1.2)

## 2015-06-16 LAB — VALPROIC ACID LEVEL: VALPROIC ACID LVL: 23 ug/mL — AB (ref 50.0–100.0)

## 2015-06-16 NOTE — Progress Notes (Signed)
Russell [name] remains very sad, depressed and flat...feeling like he is at a " standstill"..he describes this feelings as similar to how he felt prior to being admitted to the hospital...not being able to sleep and feeling tired all of the time. He cont to sleep in.Marland Kitchen.  gets up right before lunch and takes his medications.    A He does attend groups, still does not complete  His daily assessments. He does contract with this nurse for safeyt.   R He reprots he cont to struggle with not being able to sleep. Will cont to encourage pt to not sleep in day and offer medication prn.

## 2015-06-16 NOTE — [facility] Group Notes (Signed)
[facility] Group Notes:  (Clinical Social Work)  06/16/2015     1:15-2:45PM  Summary of Progress/Problems:   In today's process group, healthy and unhealthy coping techniques were discussed and compared.  Motivational Interviewing was used to help patients explore what it will take to start using healthier techniques to handle difficult situations, with many specific examples given by different patients of current situations.  Much of the discussion centered on using better coping skills in order to help children be able to use healthier coping.   The patient expressed that his own unhealthy coping involves smoking cigarettes and shutting down from other people.  He would like to be a better father by spending more quality time with his 44yo daughter and deliberately working on positive communication with her.  He was a very supportive group member to others today.  Type of Therapy:  Group Therapy - Process   Participation Level:  Active  Participation Quality:  Appropriate, Attentive, Sharing and Supportive  Affect:  Appropriate  Cognitive:  Alert, Appropriate and Oriented  Insight:  Engaged  Engagement in Therapy:  Engaged  Modes of Intervention:  Education, Motivational Interviewing  Ambrose MantleMareida Grossman-Orr, LCSW 06/16/2015, 2:47 PM

## 2015-06-16 NOTE — Progress Notes (Signed)
Patient ID: Russell [name], male   DOB: 1971-04-22, 44 y.o.   MRN: [id number] [facility] MD Progress Note  06/16/2015 12:35 PM Russell [name]  MRN:  [id number]    Subjective:   Patient states "I'm tired.  I didn't sleep last night"  Objective :  Patient seen, interviewed, chart reviewed, discussed with nursing staff and behavior staff, reviewed the sleep log and vitals chart and reviewed the labs. On evaluation patient was lying in bed stating that he was tired from lack of sleep last night.  States that his medications is not helping with sleep.  States that he is tolerating his mediations and denies adverse reactions; states that he has not ate breakfast this morning and has not attended group.  Continues to endorse suicidal thoughts but states that his last thought was yesterday. Rates his depression 5/10 at this time and anxiety 8/10  Patient is somewhat irritable this morning states it is because he is tired and wants to sleep.     Principal Problem: Bipolar I disorder, most recent episode depressed ([facility]) Diagnosis:   Patient Active Problem List   Diagnosis Date Noted  . Depression [F32.9] 06/13/2015  . Bipolar I disorder, most recent episode depressed ([facility]) [F31.30] 06/13/2015  . Sedative, hypnotic or anxiolytic use disorder, severe, dependence ([facility]) [F13.20] 12/25/2014  . 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 Alcohol use disorder, severe, dependence ([facility]) [F10.20] 12/23/2014  . Tobacco use disorder [F17.200] 12/23/2014  . Seizure disorder ([facility]) [G40.909] 12/23/2014  . 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 Opioid use disorder, severe, dependence ([facility]) [F11.20]   . Self-inflicted laceration of wrist [S61.519A]    Total Time spent with patient: 25 minutes     Past Medical History:  Past Medical History  Diagnosis Date  . Hernia   . Bipolar 1 disorder ([facility])   . Schizophrenia ([facility])   . Depression   . PONV (postoperative nausea and  vomiting)     Over twenty years ago.  Marland Kitchen Anxiety   . Epilepsy ([facility])     last sz 03/09/15    Past Surgical History  Procedure Laterality Date  . Ankle fracture surgery  1994 - approximate  . Ligament repair  1998    right arm/wrist   Family History:  Family History  Problem Relation Age of Onset  . Diabetes Father   . Alcoholism Brother   . Cancer Maternal Grandmother   . Cancer Paternal Grandfather    Social History:  History  Alcohol Use  . 0.0 oz/week  . 0 Standard drinks or equivalent per week    Comment: Drinks: 2-3 month. Last drink: Saturday     History  Drug Use No    Social History   Social History  . Marital Status: Single    Spouse Name: Lawson Fiscal  . Number of Children: 2  . Years of Education: 7   Occupational History  .      [employer]   Social History Main Topics  . Smoking status: Former Smoker -- 0.50 packs/day    Types: Cigarettes    Quit date: 03/22/2000  . Smokeless tobacco: Never Used  . Alcohol Use: 0.0 oz/week    0 Standard drinks or equivalent per week     Comment: Drinks: 2-3 month. Last drink: Saturday  . Drug Use: No  . Sexual Activity: Not Asked   Other Topics Concern  . None   Social History Narrative   Lives at home with  wife, child   No caffeine use   Additional Social History:   Sleep:  Fair   Appetite:  improved  describes it as " getting better "  Current Medications: Current Facility-Administered Medications  Medication Dose Route Frequency Provider Last Rate Last Dose  . acetaminophen (TYLENOL) tablet 650 mg  650 mg Oral Q6H PRN Adonis Brook, NP   650 mg at 06/13/15 2117  . alum & mag hydroxide-simeth (MAALOX/MYLANTA) 200-200-20 MG/5ML suspension 30 mL  30 mL Oral Q4H PRN Adonis Brook, NP      . divalproex (DEPAKOTE ER) 24 hr tablet 1,000 mg  1,000 mg Oral q morning - 10a Adonis Brook, NP   1,000 mg at 06/16/15 1210  . LORazepam (ATIVAN) tablet 1 mg  1 mg Oral Q12H PRN Craige Cotta, MD   1 mg at  06/15/15 2113  . magnesium hydroxide (MILK OF MAGNESIA) suspension 30 mL  30 mL Oral Daily PRN Adonis Brook, NP      . nicotine (NICODERM CQ - dosed in mg/24 hours) patch 21 mg  21 mg Transdermal Daily Craige Cotta, MD   21 mg at 06/15/15 1110  . OLANZapine (ZYPREXA) tablet 7.5 mg  7.5 mg Oral QHS Rockey Situ Cobos, MD   7.5 mg at 06/15/15 2113  . pantoprazole (PROTONIX) EC tablet 80 mg  80 mg Oral Daily Adonis Brook, NP   80 mg at 06/16/15 1210  . pneumococcal 23 valent vaccine (PNU-IMMUNE) injection 0.5 mL  0.5 mL Intramuscular Tomorrow-1000 Fernando A Cobos, MD      . sertraline (ZOLOFT) tablet 100 mg  100 mg Oral Daily Craige Cotta, MD   100 mg at 06/16/15 1211    Lab Results:  No results found for this or any previous visit (from the past 48 hour(s)).  Physical Findings: AIMS: Facial and Oral Movements Muscles of Facial Expression: None, normal Lips and Perioral Area: None, normal Jaw: None, normal Tongue: None, normal,Extremity Movements Upper (arms, wrists, hands, fingers): None, normal Lower (legs, knees, ankles, toes): None, normal, Trunk Movements Neck, shoulders, hips: None, normal, Overall Severity Severity of abnormal movements (highest score from questions above): None, normal Incapacitation due to abnormal movements: None, normal Patient's awareness of abnormal movements (rate only patient's report): No Awareness, Dental Status Current problems with teeth and/or dentures?: No Does patient usually wear dentures?: No  CIWA:    COWS:     Musculoskeletal: Strength & Muscle Tone: within normal limits Gait & Station: normal Patient leans: N/A  Psychiatric Specialty Exam: ROS denies headache, denies shortness of breath, denies chest pain, no vomiting   Blood pressure 153/82, pulse 77, temperature 97.6 F (36.4 C), temperature source Oral, resp. rate 16, height  (1.854 m), weight 92.5 kg (203 lb 14.8 oz).Body mass index is 26.91 kg/(m^2).  General  Appearance: improved grooming  Eye Contact::  Good  Speech:  Normal Rate  Volume:  Normal  Mood:  Depressed  Affect:   Depressed  Thought Process:  Linear  Orientation:  Full (Time, Place, and Person)  Thought Content:  Denies hallucinations, delusions, and paranoia at this time  Suicidal Thoughts:  Yes.  without intent/plan  States that he is having on and off; but last thoughts were yesterday (last night)  Homicidal Thoughts:  No  Memory:  recent and remote grossly intact   Judgement:  Fair  Insight:  Lacking  Psychomotor Activity:  Normal  Concentration:  Good  Recall:  Good  Fund of Knowledge:Good  Language:  Good  Akathisia:  Negative  Handed:  Right  AIMS (if indicated):     Assets:  Communication Skills Desire for Improvement Resilience  ADL's:  Intact  Cognition: WNL  Sleep:      Treatment Plan Summary: Daily contact with patient to assess and evaluate symptoms and progress in treatment, Medication management, Plan inpatient admission and medications as below Encourage ongoing milieu participation to work on coping skills and symptom reduction Continue Depakote ER 1000 mgrs QDAY for history of mood disorder and history of seizure disorder  Obtain routine Valproic Acid Serum level, and routine Liver function test, as on Depakote.  Continue Zoloft 100 mgrs QDAY for management of depression and anxiety Increase Zyprexa to 7.5  mgrs QHS for management of mood disorder, depression, psychotic symptoms and to help improve sleep Increase  Ativan to 1  mgrs Q 12  hours PRN for anxiety and also for insomnia as needed   Continue with current treatment plan; no changes at this time   Rankin, Shuvon, FNP-BC 06/16/2015, 12:35 PM

## 2015-06-16 NOTE — Progress Notes (Addendum)
D: Pt is alert and oriented x4. Pt endorses severe anxiety and depression. Pt denies pain. AVH, SI/HI and paranoia. He states, "Also though am still very anxious and depressed, I also used to be very paranoid that people are just going to come into the house to harm me; I feel safe here and that has helped with my paranoia." Pt also verbalizes hope.  A: Medications offered as prescribed.  Support, encouragement, and safe environment provided.  15-minute safety checks continue.  R: Pt was med compliant.  Pt was did attend group. Safety checks continue.

## 2015-06-16 NOTE — Progress Notes (Deleted)
D: Pt is alert and oriented x4. Pt with a flat affect endorses severe anxiety and depression. He states, "my anxiety and depression are about a 10 each." Pt states that he has no support system and he was ready to always put himself first from now on. Pt denies pain, AVH, SI and HI. Pt is very hopeful. Pt was very cooperative through the assessment.  A: Medications offered as prescribed. Support, encouragement, and safe environment provided. 15-minute safety checks continue.  R: Pt was med compliant. Pt was did attend group. Safety checks continue.  

## 2015-06-16 NOTE — Progress Notes (Signed)
D: Patient observed in milieu interacting with peers. Patient denies SI/HI A/V hallucinations. Patient stated his goal for the day is to" be better than yesterday." Patient continues to express that he is having difficulty sleeping. Patient voices no other concerns at this time. A: Patient offered support and encouragement. Patient medications reviewed and patient aware that his medications were addressed by S.  Rankin NP earlier today. Patient taking medications as ordered. Q 15 minute checks in progress and maintained for safety. R: Patient attending groups and interacts well with peers and staff. Patient is receptive to treatment plan.

## 2015-06-17 MED ORDER — TRAZODONE HCL 50 MG PO TABS: 50.0000 mg | ORAL_TABLET | Freq: Every evening | ORAL | Status: DC | PRN

## 2015-06-17 MED ORDER — ZOLPIDEM TARTRATE 5 MG PO TABS
5.0000 mg | ORAL_TABLET | Freq: Every evening | ORAL | Status: DC | PRN
  Administered 2015-06-17: 5 mg via ORAL
  Filled 2015-06-17: qty 1

## 2015-06-17 NOTE — Progress Notes (Signed)
Patient ID: Russell [name], male   DOB: [birth year], 44 y.o.   MRN: [id number] [employer] MD Progress Note  06/17/2015 12:22 PM Russell [name]  MRN:  [id number]    Subjective:  "I'm still tired; I keep telling everyone that I am not getting any sleep at night and just when I feel like I can go to sleep it is time to get up and everybody is in and out of my room."  Objective :  Patient seen, interviewed, chart reviewed, discussed with nursing staff and behavior staff, reviewed the sleep log and vitals chart and reviewed the labs.On evaluation patient is in bed again; stating that he got up around lunch time yesterday.  States that he is not sleeping at night and that the Ativan is not helping.  States that he continues to have suicidal thoughts on and off last night being the last time he had thoughts.  Denies auditory/visual hallucinations, and paranoia.  Rates his depression 6/10 and anxiety 8/10.    Principal Problem: Bipolar I disorder, most recent episode depressed (HCC) Diagnosis:   Patient Active Problem List   Diagnosis Date Noted  . Depression [F32.9] 06/13/2015  . Bipolar I disorder, most recent episode depressed (HCC) [F31.30] 06/13/2015  . Sedative, hypnotic or anxiolytic use disorder, severe, dependence (HCC) [F13.20] 12/25/2014  . 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 Alcohol use disorder, severe, dependence (HCC) [F10.20] 12/23/2014  . Tobacco use disorder [F17.200] 12/23/2014  . Seizure disorder (HCC) [G40.909] 12/23/2014  . 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 Opioid use disorder, severe, dependence (HCC) [F11.20]   . Self-inflicted laceration of wrist [S61.519A]    Total Time spent with patient: 15 minutes    Past Medical History:  Past Medical History  Diagnosis Date  . Hernia   . Bipolar 1 disorder (HCC)   . Schizophrenia (HCC)   . Depression   . PONV (postoperative nausea and vomiting)     Over twenty years ago.   Marland Kitchen Anxiety   . Epilepsy (HCC)     last sz 03/09/15    Past Surgical History  Procedure Laterality Date  . Ankle fracture surgery  1994 - approximate  . Ligament repair  1998    right arm/wrist   Family History:  Family History  Problem Relation Age of Onset  . Diabetes Father   . Alcoholism Brother   . Cancer Maternal Grandmother   . Cancer Paternal Grandfather    Social History:  History  Alcohol Use  . 0.0 oz/week  . 0 Standard drinks or equivalent per week    Comment: Drinks: 2-3 month. Last drink: Saturday     History  Drug Use No    Social History   Social History  . Marital Status: Single    Spouse Name: Lawson Fiscal  . Number of Children: 2  . Years of Education: 7   Occupational History  .      [employer]   Social History Main Topics  . Smoking status: Former Smoker -- 0.50 packs/day    Types: Cigarettes    Quit date: 03/22/2000  . Smokeless tobacco: Never Used  . Alcohol Use: 0.0 oz/week    0 Standard drinks or equivalent per week     Comment: Drinks: 2-3 month. Last drink: Saturday  . Drug Use: No  . Sexual Activity: Not Asked   Other Topics Concern  . None   Social History Narrative   Lives at home with wife,  child   No caffeine use   Additional Social History:   Sleep:  Poor   Appetite:  Fair  Current Medications: Current Facility-Administered Medications  Medication Dose Route Frequency Provider Last Rate Last Dose  . acetaminophen (TYLENOL) tablet 650 mg  650 mg Oral Q6H PRN Adonis BrookSheila Agustin, NP   650 mg at 06/13/15 2117  . alum & mag hydroxide-simeth (MAALOX/MYLANTA) 200-200-20 MG/5ML suspension 30 mL  30 mL Oral Q4H PRN Adonis BrookSheila Agustin, NP      . divalproex (DEPAKOTE ER) 24 hr tablet 1,000 mg  1,000 mg Oral q morning - 10a Adonis BrookSheila Agustin, NP   1,000 mg at 06/16/15 1210  . LORazepam (ATIVAN) tablet 1 mg  1 mg Oral Q12H PRN Craige CottaFernando A Cobos, MD   1 mg at 06/16/15 2150  . magnesium hydroxide (MILK OF MAGNESIA) suspension 30 mL  30 mL  Oral Daily PRN Adonis BrookSheila Agustin, NP      . nicotine (NICODERM CQ - dosed in mg/24 hours) patch 21 mg  21 mg Transdermal Daily Rockey SituFernando A Cobos, MD   21 mg at 06/16/15 0800  . OLANZapine (ZYPREXA) tablet 7.5 mg  7.5 mg Oral QHS Rockey SituFernando A Cobos, MD   7.5 mg at 06/16/15 2149  . pantoprazole (PROTONIX) EC tablet 80 mg  80 mg Oral Daily Adonis BrookSheila Agustin, NP   80 mg at 06/17/15 0645  . pneumococcal 23 valent vaccine (PNU-IMMUNE) injection 0.5 mL  0.5 mL Intramuscular Tomorrow-1000 Fernando A Cobos, MD   0.5 mL at 06/16/15 0800  . sertraline (ZOLOFT) tablet 100 mg  100 mg Oral Daily Craige CottaFernando A Cobos, MD   100 mg at 06/16/15 1211  . traZODone (DESYREL) tablet 50 mg  50 mg Oral QHS PRN Loann Chahal B Magdalen Cabana, NP        Lab Results:  Results for orders placed or performed during the hospital encounter of 06/13/15 (from the past 48 hour(s))  Valproic acid level     Status: Abnormal   Collection Time: 06/16/15  6:20 PM  Result Value Ref Range   Valproic Acid Lvl 23 (L) 50.0 - 100.0 ug/mL    Comment: [facility]  Hepatic function panel     Status: Abnormal   Collection Time: 06/16/15  6:20 PM  Result Value Ref Range   Total Protein 8.3 (H) 6.5 - 8.1 g/dL   Albumin 4.4 3.5 - 5.0 g/dL   AST 25 15 - 41 U/L   ALT 34 17 - 63 U/L   Alkaline Phosphatase 91 38 - 126 U/L   Total Bilirubin 0.6 0.3 - 1.2 mg/dL   Bilirubin, Direct <1.6<0.1 (L) 0.1 - 0.5 mg/dL   Indirect Bilirubin NOT CALCULATED 0.3 - 0.9 mg/dL    Comment: Performed at [facility]    Physical Findings: AIMS: Facial and Oral Movements Muscles of Facial Expression: None, normal Lips and Perioral Area: None, normal Jaw: None, normal Tongue: None, normal,Extremity Movements Upper (arms, wrists, hands, fingers): None, normal Lower (legs, knees, ankles, toes): None, normal, Trunk Movements Neck, shoulders, hips: None, normal, Overall Severity Severity of abnormal movements (highest score from questions  above): None, normal Incapacitation due to abnormal movements: None, normal Patient's awareness of abnormal movements (rate only patient's report): No Awareness, Dental Status Current problems with teeth and/or dentures?: No Does patient usually wear dentures?: No  CIWA:    COWS:     Musculoskeletal: Strength & Muscle Tone: within normal limits Gait & Station: normal Patient leans: N/A  Psychiatric Specialty Exam: Review of Systems  Psychiatric/Behavioral: Positive for depression and suicidal ideas. Negative for hallucinations. The patient is nervous/anxious and has insomnia.   All other systems reviewed and are negative.    Blood pressure 140/93, pulse 69, temperature 97.5 F (36.4 C), temperature source Oral, resp. rate 18, height  (1.854 m), weight 92.5 kg (203 lb 14.8 oz).Body mass index is 26.91 kg/(m^2).  General Appearance: Disheveled  Eye Solicitor::  None  Speech:  Normal Rate  Volume:  Normal  Mood:  Depressed  Affect:   Depressed  Thought Process:  Linear  Orientation:  Full (Time, Place, and Person)  Thought Content:  Denies hallucinations, delusions, and paranoia at this time  Suicidal Thoughts:  Yes.  without intent/plan  Continues to have suicidal thoughts states that last thoughts were last night  Homicidal Thoughts:  No  Memory:  recent and remote grossly intact   Judgement:  Fair  Insight:  Lacking  Psychomotor Activity:  Normal  Concentration:  Good  Recall:  Good  Fund of Knowledge:Good  Language: Good  Akathisia:  Negative  Handed:  Right  AIMS (if indicated):     Assets:  Communication Skills Desire for Improvement Resilience  ADL's:  Intact  Cognition: WNL  Sleep:  Number of Hours: 4.25   Treatment Plan Summary: Daily contact with patient to assess and evaluate symptoms and progress in treatment, Medication management, Plan inpatient admission and medications as below   Encourage ongoing milieu participation to work on coping skills and  symptom reduction Continue Depakote ER 1000 mgrs QDAY for history of mood disorder and history of seizure disorder  Obtain routine Valproic Acid Serum level, and routine Liver function test, as on Depakote.  Continue Zoloft 100 mgrs QDAY for management of depression and anxiety Increase Zyprexa to 7.5  mgrs QHS for management of mood disorder, depression, psychotic symptoms and to help improve sleep Increase  Ativan to 1  mgrs Q 12  hours PRN for anxiety and also for insomnia as needed  Started Trazodone 50 mg Q hs prn insomnia Repeat Valproic Acid level Tuesday (06/19/15)  Addendum:  Patient states that Trazodone does not work for him.   Discontinue Trazodone 50 mg and Ativan 1 mg Q hs prn Start Ambien 5 mg Q hs prn insomnia  Shifra Swartzentruber, FNP-BC 06/17/2015, 12:22 PM

## 2015-06-17 NOTE — Progress Notes (Signed)
Russell [name] , again, slept until noon and when he got OOB, the first thing he said is " I couldn't sleep last night". He takes his medications as ordered. He is cooperative,  he says " what did they order to help me sleep today?" . When this nurse told him it was trazadone, he said " oh no.Marland Kitchen.Marland Kitchen.Marland Kitchen.Ive tried that before and it doesn't work.    A This nurse alled NP and trazadone and ativan were dc'Russell and new order obtained to administer ambien 5 mg po x1 for sleep.    R Pt remains sad, depressed and tired. Contracts for safety.

## 2015-06-17 NOTE — [facility] Group Notes (Signed)
[facility] Group Notes:  (Clinical Social Work)  06/17/2015  1:30-2:30pm  Summary of Progress/Problems:   The main focus of today's process group was to   1)  discuss the importance of adding supports  2)  define health supports versus unhealthy supports  3)  identify the patient's current unhealthy supports and plan how to handle them  4)  Identify the patient's current healthy supports and plan what to add.  An emphasis was placed on using counselor, doctor, therapy groups, 12-step groups, and problem-specific support groups to expand supports.    The patient expressed full comprehension of the concepts presented, and agreed that there is a need to add more supports.  The patient stated that he has excellent support in his life, and his problem is simply that he himself is not healthy enough for himself to reach out for any assistance that is available.  He stated that he was treated in such a way as a child that sometimes now when people put him down it almost pleases him and makes him feel good.  Type of Therapy:  Process Group with Motivational Interviewing  Participation Level:  Active  Participation Quality:  Appropriate, Attentive, Sharing and Supportive  Affect:  Appropriate  Cognitive:  Alert, Appropriate and Oriented  Insight:  Developing/Improving  Engagement in Therapy:  Engaged  Modes of Intervention:   Education, Support and Processing, Activity  Ambrose MantleMareida Grossman-Orr, LCSW 06/17/2015

## 2015-06-17 NOTE — Progress Notes (Signed)
  Psychoeducational Group Note  Date: 06/17/2015 Time:  0930 Group Topic/Focus:  Gratefulness:  The focus of this group is to help patients identify what two things they are most grateful for in their lives. What helps ground them and to center them on their work to their recovery.  Participation Level: did not attend    Dione HousekeeperJudge, Sequoia Mincey A

## 2015-06-17 NOTE — Progress Notes (Signed)
Adult Psychoeducational Group Note  Date:  06/17/2015 Time:  9:19 PM  Group Topic/Focus:  Wrap-Up Group:   The focus of this group is to help patients review their daily goal of treatment and discuss progress on daily workbooks.  Participation Level:  Active  Participation Quality:  Appropriate and Attentive  Affect:  Appropriate  Cognitive:  Appropriate  Insight: Appropriate and Good  Engagement in Group:  Engaged  Modes of Intervention:  Education  Additional Comments:  Pt overall had a good day and plans to have a better day tomorrow.   Merlinda FrederickKeshia S Viral Schramm 06/17/2015, 9:19 PM

## 2015-06-17 NOTE — [facility] Group Notes (Addendum)
[facility] Group Notes:  (Nursing/MHT/Case Management/Adjunct)  Date:  06/17/2015  Time:  1045  Type of Therapy:  Nurse Education  /  Healthy Support Systems: The group is focused on teaching patients the importance of developing healthy support systems.   Participation Level:  Patient did nt attend.  Participation Quality:  N/A  Affect:  N/A  Cognitive:  N/A  Insight: N/.A   Engagement in Group:  N/A  Modes of Intervention:  N/A  Summary of Progress/Problems:  Russell BraveDuke, Russell [name] 06/17/2015, 12:49 PM

## 2015-06-18 MED ORDER — DIVALPROEX SODIUM ER 500 MG PO TB24
1500.0000 mg | ORAL_TABLET | Freq: Every morning | ORAL | Status: DC
  Administered 2015-06-19 – 2015-06-30 (×12): 1500 mg via ORAL
  Filled 2015-06-18 (×14): qty 3
  Filled 2015-06-18: qty 42
  Filled 2015-06-18 (×2): qty 3

## 2015-06-18 MED ORDER — ZOLPIDEM TARTRATE 10 MG PO TABS
10.0000 mg | ORAL_TABLET | Freq: Every evening | ORAL | Status: DC | PRN
  Administered 2015-06-18 – 2015-06-20 (×3): 10 mg via ORAL
  Filled 2015-06-18 (×3): qty 1

## 2015-06-18 MED ORDER — DIVALPROEX SODIUM ER 500 MG PO TB24
500.0000 mg | ORAL_TABLET | Freq: Once | ORAL | Status: AC
  Administered 2015-06-18: 500 mg via ORAL
  Filled 2015-06-18 (×2): qty 1

## 2015-06-18 MED ORDER — SERTRALINE HCL 50 MG PO TABS
150.0000 mg | ORAL_TABLET | Freq: Every day | ORAL | Status: DC
Start: 2015-06-19 — End: 2015-06-25
  Administered 2015-06-19 – 2015-06-25 (×7): 150 mg via ORAL
  Filled 2015-06-18 (×10): qty 3

## 2015-06-18 NOTE — [facility] Group Notes (Signed)
[facility] Note  06/18/2015 8:45 AM  Pt did not attend, declined invitation.   Chad CordialLauren Carter, LCSWA 06/18/2015 9:54 AM

## 2015-06-18 NOTE — [facility] Group Notes (Signed)
[facility] LCSW Group Therapy  06/18/2015 1:15pm  Type of Therapy:  Group Therapy vercoming Obstacles  Participation Level:  Minimal  Participation Quality:  Appropriate   Affect:  Appropriate  Cognitive:  Appropriate and Oriented  Insight:  Developing/Improving and Improving  Engagement in Therapy:  Limited  Modes of Intervention:  Discussion, Exploration, Problem-solving and Support  Description of Group:   In this group patients will be encouraged to explore what they see as obstacles to their own wellness and recovery. They will be guided to discuss their thoughts, feelings, and behaviors related to these obstacles. The group will process together ways to cope with barriers, with attention given to specific choices patients can make. Each patient will be challenged to identify changes they are motivated to make in order to overcome their obstacles. This group will be process-oriented, with patients participating in exploration of their own experiences as well as giving and receiving support and challenge from other group members.  Summary of Patient Progress: Pt participated minimally in group discussion but was attentive to others and engaged in nonverbal affirming gestures. Pt did identify his need to have more positive influences in his life and engage with others who understand mental illness. He expressed that his family did not know how to sympathize with him about his mental illness and reports that he needs other avenues of support.   Therapeutic Modalities:   Cognitive Behavioral Therapy Solution Focused Therapy Motivational Interviewing Relapse Prevention Therapy   Chad CordialLauren Carter, LCSWA 06/18/2015 3:16 PM

## 2015-06-18 NOTE — Progress Notes (Signed)
Patient ID: Russell [name], male   DOB: [birth year], 44 y.o.   MRN: [id number] [facility] MD Progress Note  06/18/2015 2:35 PM Russell [name]  MRN:  [id number]    Subjective:  Patient  Reports partial improvement, states he feels better , but still does not feel he is at baseline,  And is still reporting his mood as " kind of depressed ". Denies medication side effects.  Objective :  I have discussed case with treatment team and have met with patient. Patient reports partial improvement of mood compared to admission but still reports a subjective sense of sadness, and affect does continue to present as  subdued, constricted, although does smile at times appropriately, and there has been an improvement compared to his admission presentation . Denies medication side effects. He feels the medications are " starting to help a little more ".  Over the weekend he was switched from Trazodone to Ambien to address insomnia. Reports Trazodone was not working well .  States Ambien ( currently at 5 mgrs QHS) helped him sleep better, but still " could not sleep well last night , kind of tossed and turned a lot "  Of note, 10/29 Valproic Acid level was sub therapeutic - 23. ( Depakote is prescribed for seizure disorder- he  has not had any seizure activity on unit )  LFTs  Unremarkable. Behavior on unit calm, in good control. Interacts appropriately with selected peers, going to some groups, less isolative .  Principal Problem: Bipolar I disorder, most recent episode depressed ([place]) Diagnosis:   Patient Active Problem List   Diagnosis Date Noted  . Depression [F32.9] 06/13/2015  . Bipolar I disorder, most recent episode depressed ([place]) [F31.30] 06/13/2015  . Sedative, hypnotic or anxiolytic use disorder, severe, dependence ([place]) [F13.20] 12/25/2014  . Severe major depression without psychotic features ([place]) [F32.2] 12/23/2014  . Alcohol use disorder, severe, dependence ([place]) [F10.20] 12/23/2014  .  Tobacco use disorder [F17.200] 12/23/2014  . Seizure disorder ([place]) [Y09.983] 12/23/2014  . Stimulant use disorder ([place]) [F15.90]   . Opioid use disorder, severe, dependence ([place]) [F11.20]   . Self-inflicted laceration of wrist [S61.519A]    Total Time spent with patient: 20 minutes     Past Medical History:  Past Medical History  Diagnosis Date  . Hernia   . Bipolar 1 disorder ([place])   . Schizophrenia ([place])   . Depression   . PONV (postoperative nausea and vomiting)     Over twenty years ago.  Marland Kitchen Anxiety   . Epilepsy ([place])     last sz 03/09/15    Past Surgical History  Procedure Laterality Date  . Ankle fracture surgery  1994 - approximate  . Ligament repair  1998    right arm/wrist   Family History:  Family History  Problem Relation Age of Onset  . Diabetes Father   . Alcoholism Brother   . Cancer Maternal Grandmother   . Cancer Paternal Grandfather    Social History:  History  Alcohol Use  . 0.0 oz/week  . 0 Standard drinks or equivalent per week    Comment: Drinks: 2-3 month. Last drink: Saturday     History  Drug Use No    Social History   Social History  . Marital Status: Single    Spouse Name: [name]  . Number of Children: 2  . Years of Education: 7   Occupational History  .      [employer]   Social History Main Topics  .  Smoking status: Former Smoker -- 0.50 packs/day    Types: Cigarettes    Quit date: 03/22/2000  . Smokeless tobacco: Never Used  . Alcohol Use: 0.0 oz/week    0 Standard drinks or equivalent per week     Comment: Drinks: 2-3 month. Last drink: Saturday  . Drug Use: No  . Sexual Activity: Not Asked   Other Topics Concern  . None   Social History Narrative   Lives at home with wife, child   No caffeine use   Additional Social History:   Sleep:   Fair, improved compared to prior    Appetite:  Good   Current Medications: Current Facility-Administered Medications  Medication Dose Route Frequency Provider Last  Rate Last Dose  . acetaminophen (TYLENOL) tablet 650 mg  650 mg Oral Q6H PRN Kerrie Buffalo, NP   650 mg at 06/13/15 2117  . alum & mag hydroxide-simeth (MAALOX/MYLANTA) 200-200-20 MG/5ML suspension 30 mL  30 mL Oral Q4H PRN Kerrie Buffalo, NP      . divalproex (DEPAKOTE ER) 24 hr tablet 1,000 mg  1,000 mg Oral q morning - 10a Kerrie Buffalo, NP   1,000 mg at 06/18/15 1140  . magnesium hydroxide (MILK OF MAGNESIA) suspension 30 mL  30 mL Oral Daily PRN Kerrie Buffalo, NP      . nicotine (NICODERM CQ - dosed in mg/24 hours) patch 21 mg  21 mg Transdermal Daily Jenne Campus, MD   21 mg at 06/18/15 0809  . OLANZapine (ZYPREXA) tablet 7.5 mg  7.5 mg Oral QHS Myer Peer Cobos, MD   7.5 mg at 06/17/15 2219  . pantoprazole (PROTONIX) EC tablet 80 mg  80 mg Oral Daily Kerrie Buffalo, NP   80 mg at 06/18/15 0809  . pneumococcal 23 valent vaccine (PNU-IMMUNE) injection 0.5 mL  0.5 mL Intramuscular Tomorrow-1000 Fernando A Cobos, MD   0.5 mL at 06/16/15 0800  . sertraline (ZOLOFT) tablet 100 mg  100 mg Oral Daily Jenne Campus, MD   100 mg at 06/18/15 6378  . zolpidem (AMBIEN) tablet 5 mg  5 mg Oral QHS PRN Jenne Campus, MD   5 mg at 06/17/15 2219    Lab Results:  Results for orders placed or performed during the hospital encounter of 06/13/15 (from the past 48 hour(s))  Valproic acid level     Status: Abnormal   Collection Time: 06/16/15  6:20 PM  Result Value Ref Range   Valproic Acid Lvl 23 (L) 50.0 - 100.0 ug/mL    Comment: Performed at [facility]  Hepatic function panel     Status: Abnormal   Collection Time: 06/16/15  6:20 PM  Result Value Ref Range   Total Protein 8.3 (H) 6.5 - 8.1 g/dL   Albumin 4.4 3.5 - 5.0 g/dL   AST 25 15 - 41 U/L   ALT 34 17 - 63 U/L   Alkaline Phosphatase 91 38 - 126 U/L   Total Bilirubin 0.6 0.3 - 1.2 mg/dL   Bilirubin, Direct <0.1 (L) 0.1 - 0.5 mg/dL   Indirect Bilirubin NOT CALCULATED 0.3 - 0.9 mg/dL    Comment: Performed at [facility]    Physical Findings: AIMS: Facial and Oral Movements Muscles of Facial Expression: None, normal Lips and Perioral Area: None, normal Jaw: None, normal Tongue: None, normal,Extremity Movements Upper (arms, wrists, hands, fingers): None, normal Lower (legs, knees, ankles, toes): None, normal, Trunk Movements Neck, shoulders, hips: None, normal, Overall Severity Severity  of abnormal movements (highest score from questions above): None, normal Incapacitation due to abnormal movements: None, normal Patient's awareness of abnormal movements (rate only patient's report): No Awareness, Dental Status Current problems with teeth and/or dentures?: No Does patient usually wear dentures?: No  CIWA:  CIWA-Ar Total: 1 COWS:  COWS Total Score: 1  Musculoskeletal: Strength & Muscle Tone: within normal limits Gait & Station: normal Patient leans: N/A  Psychiatric Specialty Exam: Review of Systems  Psychiatric/Behavioral: Positive for depression and suicidal ideas. Negative for hallucinations. The patient is nervous/anxious and has insomnia.   All other systems reviewed and are negative.  at this time does not endorse nausea, vomiting ,  Or abdominal pain  Blood pressure 137/77, pulse 78, temperature 97.5 F (36.4 C), temperature source Oral, resp. rate 16, height 6' 1"  (1.854 m), weight 203 lb 14.8 oz (92.5 kg).Body mass index is 26.91 kg/(m^2).  General Appearance: improved grooming  Eye Contact::  Good  Speech:  Normal Rate  Volume:  Normal  Mood:  Still depressed but improved compared to admission , describes it as 5/10  Affect:    Still tends to be constricted, although smiles at times appropriately  Thought Process:  Linear  Orientation:  Full (Time, Place, and Person)  Thought Content:  Denies hallucinations, delusions, and paranoia at this time  Suicidal Thoughts:  At this time denies any suicidal or self injurious ideations and denies any violent or homicidal  ideations   Homicidal Thoughts:  No  Memory:  recent and remote grossly intact   Judgement:  Other:  improving   Insight:  improving   Psychomotor Activity:  Normal  Concentration:  Good  Recall:  Good  Fund of Knowledge:Good  Language: Good  Akathisia:  Negative  Handed:  Right  AIMS (if indicated):     Assets:  Communication Skills Desire for Improvement Resilience  ADL's:  Intact  Cognition: WNL  Sleep:  Number of Hours: 4.75  Assessment - Patient partially improved compared to admission, denies any suicidal ideations,  but still presents with some depression and with a somewhat constricted affect .  Denies medication side effects. He does feel hopeful about current medication regimen , and feels Zoloft and Zyprexa " are starting to work ".  Insomnia has been ongoing issue for patient, now partially improved on Ambien. Of note, patient's Valproic Acid Serum level is sub- therapeutic .  Treatment Plan Summary: Daily contact with patient to assess and evaluate symptoms and progress in treatment, Medication management, Plan inpatient admission and medications as below   Encourage ongoing milieu participation to work on coping skills and symptom reduction Increase  Depakote ER to 1500  mgrs QDAY for history of mood disorder and history of seizure disorder  Increase  Zoloft to 150 mgrs QDAY for management of depression and anxiety Continue Zyprexa  7.5  mgrs QHS for management of mood disorder, depression, psychotic symptoms and to help improve sleep Increase Ambien to 10  mgrs QHS PRN for insomnia  Repeat Valproic Acid level  Wednesday  (06/20/15)   Neita Garnet,  MD  06/18/2015, 2:35 PM

## 2015-06-18 NOTE — Progress Notes (Addendum)
D: Patient's self inventory sheet, patient has fair sleep, sleep medication was not helpful. Fair appetite, normal energy level, good concentration. Rated depression and hopeless 5, anxiety 4. Denied withdrawals. Has experienced cramping. Denied SI. Denied physical problems. Worst pain in past 24 hours is #4, stomach pain, no pain medication. Goal is "staying positive". Plans to have "positive thinking". Denied discharge plans. A: Medications administered per MD orders. Emotional support and encouragement given patient. R: Denied SI and HI, contracts for safety. Denied A/V hallucinations. Safety maintained with 15 minute checks.

## 2015-06-18 NOTE — Progress Notes (Signed)
D: Pt denies SI/HI/AVH. Pt is pleasant and cooperative. Pt seen interacting with peers and staff on the unit. Pt only complaint in not sleeping. Pt stated he has this problem at home sometimes which seems to start from racing thoughts that turns into no sleep for 2-3 days that ends up in SI and wanting to hurt himself. Pt stated that luckily this time he called a friend that suggested he come to the hospital.   A: Pt was offered support and encouragement. Pt was given scheduled medications. Pt was encourage to attend groups. Q 15 minute checks were done for safety.  R:Pt attends groups and interacts well with peers and staff. Pt is taking medication. Pt has no complaints at this time .Pt receptive to treatment and safety maintained on unit.

## 2015-06-18 NOTE — Plan of Care (Signed)
Problem: Alteration in mood Goal: LTG-Patient reports reduction in suicidal thoughts (Patient reports reduction in suicidal thoughts and is able to verbalize a safety plan for whenever patient is feeling suicidal)  Outcome: Progressing Pt denies SI at this time     

## 2015-06-18 NOTE — Progress Notes (Signed)
Adult Psychoeducational Group Note  Date:  06/18/2015 Time:  9:56 PM  Group Topic/Focus:  Wrap-Up Group:   The focus of this group is to help patients review their daily goal of treatment and discuss progress on daily workbooks.  Participation Level:  Active  Participation Quality:  Appropriate and Attentive  Affect:  Appropriate  Cognitive:  Appropriate  Insight: Appropriate and Good  Engagement in Group:  Engaged  Modes of Intervention:  Education  Additional Comments:  Pt overall had a good day. Pt goal is to get a good night sleep.   Russell [name] 06/18/2015, 9:56 PM

## 2015-06-19 NOTE — Progress Notes (Signed)
Recreation Therapy Notes  Animal-Assisted Activity (AAA) Program Checklist/Progress Notes Patient Eligibility Criteria Checklist & Daily Group note for Rec Tx Intervention  Date: 11.01.2016 Time: 2:15pm [address]   AAA/T Program Assumption of Risk Form signed by Patient/ or Parent Legal Guardian yes  Patient is free of allergies or sever asthma yes  Patient reports no fear of animals yes  Patient reports no history of cruelty to animals yes  Patient understands his/her participation is voluntary yes  Patient washes hands before animal contact yes  Patient washes hands after animal contact yes  Behavioral Response: Appropriate   Education: Hand Washing, Appropriate Animal Interaction   Education Outcome: Acknowledges education.   Clinical Observations/Feedback: Patient interacted appropriately with therapy dog, petting him and interacting with peers appropriately during session.   Marykay Lexenise L Lindey Renzulli, LRT/CTRS        Jearl KlinefelterBlanchfield, Izeyah Deike L 06/19/2015 2:34 PM

## 2015-06-19 NOTE — Progress Notes (Signed)
D:  Patient's self inventory sheet, patient has fair sleep, sleep medication is not helpful.  Good appetite, normal energy level, good concentration.  Rated depression and hopeless #4, anxiety #5.  Denied withdrawals.  Has experienced cramping in past 24 hours.  Denied physical problems.  Physical pain, stomach, worst pain #5 in past 24 hours.  Goal is to shower today, plans to get in shower.  No discharge plans.  No problems anticipated after discharge. A:  Medications administered per MD orders.  Emotional support and encouragement given patient. R:  Denied SI and HI, contracts for safety.  Denied A/V hallucinations.  Safety maintained with 15 minute checks.

## 2015-06-19 NOTE — [facility] Group Notes (Signed)
The focus of this group is to educate the patient on the purpose and policies of crisis stabilization and provide a format to answer questions about their admission.  The group details unit policies and expectations of patients while admitted.  Patient did not attend 0900 nurse education orientation group this morning.  Patient stayed in bed.   

## 2015-06-19 NOTE — Plan of Care (Signed)
Problem: Consults Goal: Depression Patient Education See Patient Education Module for education specifics.  Outcome: Progressing Nurse discussed depression/coping skills with patient.        

## 2015-06-19 NOTE — [facility] Group Notes (Signed)
[facility] LCSW Group Therapy  06/19/2015   1:15 PM   Type of Therapy:  Group Therapy  Participation Level:  Active  Participation Quality:  Attentive, Sharing and Supportive  Affect:  Appropriate  Cognitive:  Alert and Oriented  Insight:  Developing/Improving and Engaged  Engagement in Therapy:  Developing/Improving and Engaged  Modes of Intervention:  Clarification, Confrontation, Discussion, Education, Exploration, Limit-setting, Orientation, Problem-solving, Rapport Building, Dance movement psychotherapisteality Testing, Socialization and Support  Summary of Progress/Problems: The topic for group therapy was feelings about diagnosis.  Pt actively participated in group discussion on their past and current diagnosis and how they feel towards this.  Pt also identified how society and family members judge them, based on their diagnosis as well as stereotypes and stigmas.  Patient discussed feeling angry by his illness and how it affects his daily living. CSW and other group members provided patient with emotional support and encouragement.  Samuella BruinKristin Gerardo Caiazzo, MSW, [employer]CSWA Clinical Social Worker [facility] [phone]

## 2015-06-19 NOTE — Plan of Care (Signed)
Problem: Diagnosis: Increased Risk For Suicide Attempt Goal: STG-Patient Will Comply With Medication Regime Outcome: Progressing Pt is safe and compliant with medication regime     

## 2015-06-19 NOTE — Tx Team (Signed)
Interdisciplinary Treatment Plan Update (Adult) Date: 06/19/2015   Date: 06/19/2015 4:14 PM  Progress in Treatment:  Attending groups: Yes  Participating in groups: Yes  Taking medication as prescribed: Yes  Tolerating medication: Yes  Family/Significant othe contact made: No, CSW assessing for appropriate contact Patient understands diagnosis: Yes Discussing patient identified problems/goals with staff: Yes  Medical problems stabilized or resolved: Yes  Denies suicidal/homicidal ideation: Yes Patient has not harmed self or Others: Yes   New problem(s) identified: None identified at this time.   Discharge Plan or Barriers: Pt will return home and follow-up with outpatient resources  Additional comments: n/a   Reason for Continuation of Hospitalization:  Anxiety Depression Medication stabilization Suicidal ideation  Estimated length of stay: 3-5 days  Review of initial/current patient goals per problem list:   1.  Goal(s): Patient will participate in aftercare plan  Met:  Yes  Target date: 3-5 days from date of admission   As evidenced by: Patient will participate within aftercare plan AEB aftercare provider and housing plan at discharge being identified.   06/14/15: Pt will return home and follow-up with outpatient resources  2.  Goal (s): Patient will exhibit decreased depressive symptoms and suicidal ideations.  Met:  goal progressing  Target date: 3-5 days from date of admission   As evidenced by: Patient will utilize self rating of depression at 3 or below and demonstrate decreased signs of depression or be deemed stable for discharge by MD.  06/14/15: Pt rates depression at 6/10; denies SI  06/19/15: Pt rates depression at 4/10; denies SI  3.  Goal(s): Patient will demonstrate decreased signs and symptoms of anxiety.  Met:  Goal progressing  Target date: 3-5 days from date of admission   As evidenced by: Patient will utilize self rating of anxiety at 3  or below and demonstrated decreased signs of anxiety, or be deemed stable for discharge by MD  06/14/15: Pt rates anxiety at 7/10.  06/19/15: Pt rates anxiety at 5/10.  Attendees:  Patient:    Family:    Physician: Dr. Parke Poisson, MD  06/19/2015 4:14 PM  Nursing: Lars Pinks, RN Case manager  06/19/2015 4:14 PM  Clinical Social Worker Norman Clay, MSW 06/19/2015 4:14 PM  Other: Jake Bathe Liasion 06/19/2015 4:14 PM  Clinical: Grayland Ormond, RN 06/19/2015 4:14 PM  Other: , RN Charge Nurse 06/19/2015 4:14 PM  Other:     Peri Maris, [place] MSW

## 2015-06-19 NOTE — [facility] Group Notes (Signed)
Adult Psychoeducational Group Note  Date:  06/19/2015 Time:  8:48 PM  Group Topic/Focus:  Wrap-Up Group:   The focus of this group is to help patients review their daily goal of treatment and discuss progress on daily workbooks.  Participation Level:  Active  Participation Quality:  Appropriate and Attentive  Affect:  Appropriate  Cognitive:  Alert and Appropriate  Insight: Appropriate  Engagement in Group:  Engaged  Modes of Intervention:  Discussion  Additional Comments:  Pt stated he had a good day.   Shelly BombardGarner, Raena Pau D 06/19/2015, 8:48 PM

## 2015-06-19 NOTE — Progress Notes (Signed)
Patient ID: Russell [name], male   DOB: Jun 05, 1971, 44 y.o.   MRN: [id number]  [facility] MD Progress Note  06/19/2015 6:06 PM Russell [name]  MRN:  [id number]    Subjective:   Patient reports partial improvement . He states he is less depressed , but still not at baseline. He denies medication side effects.   Objective :  I have discussed case with treatment team and have met with patient. Reports and presents with partial improvement in mood and affect compared to admission status . Less severely depressed, affect more reactive, but still somewhat constricted, sad.  He is moere future oriented, and focusing more on discharge planning- at this time plans to return home after discharge and plans to continue outpatient treatment .  He is ruminative about his decreased ability to work related to his psychiatric and neurologic illnesses ( has history of seizure disorder , due to which he does not drive ) . States that his inability to work and provide for family is a contributor to his depression. He is reactive to support, encouragement, support, and affect does present as significantly improved compared to admission. Going to groups, no behavioral difficulties on unit, going to [employer] with peers . Denies medication side effects. Of note, patient reports intermittent , occasional , but persistent experience of " hearing an explosion inside my head". This not associated with anxiety, depression, and not described as hallucination as patient describes it more as  " a sound inside my head, so not a real sound, and also a feeling that is difficult to describe ". These are fleeting symptoms.  Have encouraged him to discuss these with his neurologist as well- as noted, patient has a diagnosis of seizure disorder and states that the above related experiences started roughly at the same time as his seizure disorder . Of note, patient has had no seizures on unit .    Principal Problem:  Bipolar I disorder, most recent episode depressed ([place]) Diagnosis:   Patient Active Problem List   Diagnosis Date Noted  . Depression [F32.9] 06/13/2015  . Bipolar I disorder, most recent episode depressed ([place]) [F31.30] 06/13/2015  . 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 Severe major depression without psychotic features ([place]) [F32.2] 12/23/2014  . Alcohol use disorder, severe, dependence ([place]) [F10.20] 12/23/2014  . Tobacco use disorder [F17.200] 12/23/2014  . Seizure disorder ([place]) [N82.956] 12/23/2014  . Stimulant use disorder ([place]) [F15.90]   . Opioid use disorder, severe, dependence ([place]) [F11.20]   . Self-inflicted laceration of wrist [S61.519A]    Total Time spent with patient: 25 minutes     Past Medical History:  Past Medical History  Diagnosis Date  . Hernia   . Bipolar 1 disorder ([place])   . Schizophrenia ([place])   . Depression   . PONV (postoperative nausea and vomiting)     Over twenty years ago.  Marland Kitchen Anxiety   . Epilepsy ([place])     last sz 03/09/15    Past Surgical History  Procedure Laterality Date  . Ankle fracture surgery  1994 - approximate  . Ligament repair  1998    right arm/wrist   Family History:  Family History  Problem Relation Age of Onset  . Diabetes Father   . Alcoholism Brother   . Cancer Maternal Grandmother   . Cancer Paternal Grandfather    Social History:  History  Alcohol Use  . 0.0 oz/week  . 0 Standard drinks or equivalent  per week    Comment: Drinks: 2-3 month. Last drink: Saturday     History  Drug Use No    Social History   Social History  . Marital Status: Single    Spouse Name: [name]  . Number of Children: 2  . Years of Education: 7   Occupational History  .      [employer]   Social History Main Topics  . Smoking status: Former Smoker -- 0.50 packs/day    Types: Cigarettes    Quit date: 03/22/2000  . Smokeless tobacco: Never Used  . Alcohol Use:  0.0 oz/week    0 Standard drinks or equivalent per week     Comment: Drinks: 2-3 month. Last drink: Saturday  . Drug Use: No  . Sexual Activity: Not Asked   Other Topics Concern  . None   Social History Narrative   Lives at home with wife, child   No caffeine use   Additional Social History:   Sleep:   Fair, improved compared to prior    Appetite:  Good   Current Medications: Current Facility-Administered Medications  Medication Dose Route Frequency Provider Last Rate Last Dose  . acetaminophen (TYLENOL) tablet 650 mg  650 mg Oral Q6H PRN Kerrie Buffalo, NP   650 mg at 06/13/15 2117  . alum & mag hydroxide-simeth (MAALOX/MYLANTA) 200-200-20 MG/5ML suspension 30 mL  30 mL Oral Q4H PRN Kerrie Buffalo, NP      . divalproex (DEPAKOTE ER) 24 hr tablet 1,500 mg  1,500 mg Oral q morning - 10a Myer Peer Janautica Netzley, MD   1,500 mg at 06/19/15 1000  . magnesium hydroxide (MILK OF MAGNESIA) suspension 30 mL  30 mL Oral Daily PRN Kerrie Buffalo, NP      . nicotine (NICODERM CQ - dosed in mg/24 hours) patch 21 mg  21 mg Transdermal Daily Jenne Campus, MD   21 mg at 06/19/15 0958  . OLANZapine (ZYPREXA) tablet 7.5 mg  7.5 mg Oral QHS Myer Peer Dontee Jaso, MD   7.5 mg at 06/18/15 2215  . pantoprazole (PROTONIX) EC tablet 80 mg  80 mg Oral Daily Kerrie Buffalo, NP   80 mg at 06/19/15 0959  . pneumococcal 23 valent vaccine (PNU-IMMUNE) injection 0.5 mL  0.5 mL Intramuscular Tomorrow-1000 Valia Wingard A Jacobus Colvin, MD   0.5 mL at 06/16/15 0800  . sertraline (ZOLOFT) tablet 150 mg  150 mg Oral Daily Jenne Campus, MD   150 mg at 06/19/15 0959  . zolpidem (AMBIEN) tablet 10 mg  10 mg Oral QHS PRN Jenne Campus, MD   10 mg at 06/18/15 2215    Lab Results:  No results found for this or any previous visit (from the past 82 hour(s)).  Physical Findings: AIMS: Facial and Oral Movements Muscles of Facial Expression: None, normal Lips and Perioral Area: None, normal Jaw: None, normal Tongue: None,  normal,Extremity Movements Upper (arms, wrists, hands, fingers): None, normal Lower (legs, knees, ankles, toes): None, normal, Trunk Movements Neck, shoulders, hips: None, normal, Overall Severity Severity of abnormal movements (highest score from questions above): None, normal Incapacitation due to abnormal movements: None, normal Patient's awareness of abnormal movements (rate only patient's report): No Awareness, Dental Status Current problems with teeth and/or dentures?: No Does patient usually wear dentures?: No  CIWA:  CIWA-Ar Total: 1 COWS:  COWS Total Score: 1  Musculoskeletal: Strength & Muscle Tone: within normal limits Gait & Station: normal Patient leans: N/A  Psychiatric Specialty Exam: Review of Systems  Psychiatric/Behavioral: Positive for depression and suicidal ideas. Negative for hallucinations. The patient is nervous/anxious and has insomnia.   All other systems reviewed and are negative.  at this time does not endorse nausea, vomiting ,  Or abdominal pain  Blood pressure 125/76, pulse 80, temperature 97.7 F (36.5 C), temperature source Oral, resp. rate 16, height _0  (1.854 m), weight 203 lb 14.8 oz (92.5 kg).Body mass index is 26.91 kg/(m^2).  General Appearance: improved grooming  Eye Contact::  Good  Speech:  Normal Rate  Volume:  Normal  Mood:   Improved, but still feels vaguely depressed   Affect:  Less constricted , less sad than on admission, smiles at times appropriately  Thought Process:  Linear  Orientation:  Full (Time, Place, and Person)  Thought Content:  Denies hallucinations, delusions, and paranoia at this time  Suicidal Thoughts:  At this time denies any suicidal or self injurious ideations and denies any violent or homicidal ideations   Homicidal Thoughts:  No  Memory:  recent and remote grossly intact   Judgement:  Other:  improving   Insight:  improving   Psychomotor Activity:  Normal  Concentration:  Good  Recall:  Good  Fund of  Knowledge:Good  Language: Good  Akathisia:  Negative  Handed:  Right  AIMS (if indicated):     Assets:  Communication Skills Desire for Improvement Resilience  ADL's:  Intact  Cognition: WNL  Sleep:  Number of Hours: 5  Assessment -  Partial improvement of mood and affect- patient still feels depressed, but acknowledges significant improvement in mood since admission and does present with fuller range of affect. No SI. As patient improves he is also more focused on discharge planning options. At present tolerating medications well.   Treatment Plan Summary: Daily contact with patient to assess and evaluate symptoms and progress in treatment, Medication management, Plan inpatient admission and medications as below   Encourage ongoing milieu participation to work on coping skills and symptom reduction Continue Depakote ER 1500  mgrs QDAY for history of mood disorder and history of seizure disorder  Continue  Zoloft  150 mgrs QDAY for management of depression and anxiety Continue Zyprexa  7.5  mgrs QHS for management of mood disorder, depression, psychotic symptoms and to help improve sleep Continue  Ambien to 10  mgrs QHS PRN for insomnia  Repeat Valproic Acid Serum level  Treatment team working on disposition planning    Neita Garnet,  MD  06/19/2015, 6:06 PM

## 2015-06-19 NOTE — Progress Notes (Signed)
D: Pt denies SI/HI/AVH. Pt is pleasant and cooperative. Pt stated he was feeling much better, but may not be ready to leave for a couple of days, when he gets his confidence up.   A: Pt was offered support and encouragement. Pt was given scheduled medications. Pt was encourage to attend groups. Q 15 minute checks were done for safety.   R:Pt attends groups and interacts well with peers and staff. Pt is taking medication. Pt has no complaints at this time .Pt receptive to treatment and safety maintained on unit.

## 2015-06-19 NOTE — Progress Notes (Signed)
Patient ID: Russell [name], male   DOB: 1971/05/27, 44 y.o.   MRN: [id number] D: Patient alert and cooperative. Denies SI/HI/AVH. Pt c/o about not sleeping and hoping to sleep with increase dosage of medication. Pt observe interacting well with peers. Pt reports he is tolerating medication well. Pt attended and participated in evening wrap up group. Cooperative with assessment.  A: Met with pt 1:1. Medications administered as prescribed. Support and encouragement provided. Pt encouraged to discuss feelings and come to staff with any question or concerns.  R: Patient remains safe and complaint with medications.

## 2015-06-19 NOTE — Plan of Care (Signed)
Problem: Alteration in mood; excessive anxiety as evidenced by: Goal: LTG-Patient's behavior demonstrates decreased anxiety (Patient's behavior demonstrates anxiety and he/she is utilizing learned coping skills to deal with anxiety-producing situations)  Outcome: Progressing Pt observed interacting on the unit with peers , appeared to be less anxious this evening

## 2015-06-20 LAB — VALPROIC ACID LEVEL: VALPROIC ACID LVL: 69 ug/mL (ref 50.0–100.0)

## 2015-06-20 NOTE — [facility] Group Notes (Signed)
[facility] Note  06/20/2015 8:45 AM  Pt did not attend, declined invitation.   Chad CordialLauren Carter, LCSWA 06/20/2015 10:22 AM

## 2015-06-20 NOTE — Progress Notes (Signed)
Patient ID: Russell [name], male   DOB: [birth year], 44 y.o.   MRN: [id number]  [facility] MD Progress Note  06/20/2015 5:27 PM Russell [name]  MRN:  [id number]    Subjective:  Reports he is doing better- less depressed, although not feeling back to baseline and still feels he is struggling with some depression. Less anhedonic, and today speaking animatedly about tonight's World [employer]. Denies medication side effects.    Objective :  I have discussed case with treatment team and have met with patient. Patient visible on unit , going to groups, interactive, pleasant. Reporting significant improvement in mood, but some lingering anxiety. Presents with overall improved mood, improved range of affect. At this time not endorsing medication side effects, feels medications are helping .  No seizures on unit thus far- on Depakote ER, which was recently titrated up  in dose, due to sub therapeutic serum level- no side effects reported at higher dose . Marland Kitchen Admits he is anxious about discharge, but feels more ready to discuss discharge /disposition planning- plans to return home after discharge. I have encouraged him to, in addition to seeing psychiatrist, therapist, consider going to [place] or /and to [place] to address gambling issues and history of alcohol use disorder- now sober x several months as per his report .    Principal Problem: Bipolar I disorder, most recent episode depressed ([place]) Diagnosis:   Patient Active Problem List   Diagnosis Date Noted  . Depression [F32.9] 06/13/2015  . Bipolar I disorder, most recent episode depressed ([place]) [F31.30] 06/13/2015  . 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 Severe major depression without psychotic features ([place]) [F32.2] 12/23/2014  . Alcohol use disorder, severe, dependence ([place]) [F10.20] 12/23/2014  . Tobacco use disorder [F17.200] 12/23/2014  . Seizure disorder ([place]) [Z00.923]  12/23/2014  . Stimulant use disorder ([place]) [F15.90]   . Opioid use disorder, severe, dependence ([place]) [F11.20]   . Self-inflicted laceration of wrist [S61.519A]    Total Time spent with patient: 25 minutes     Past Medical History:  Past Medical History  Diagnosis Date  . Hernia   . Bipolar 1 disorder ([place])   . Schizophrenia ([place])   . Depression   . PONV (postoperative nausea and vomiting)     Over twenty years ago.  Marland Kitchen Anxiety   . Epilepsy ([place])     last sz 03/09/15    Past Surgical History  Procedure Laterality Date  . Ankle fracture surgery  1994 - approximate  . Ligament repair  1998    right arm/wrist   Family History:  Family History  Problem Relation Age of Onset  . Diabetes Father   . Alcoholism Brother   . Cancer Maternal Grandmother   . Cancer Paternal Grandfather    Social History:  History  Alcohol Use  . 0.0 oz/week  . 0 Standard drinks or equivalent per week    Comment: Drinks: 2-3 month. Last drink: Saturday     History  Drug Use No    Social History   Social History  . Marital Status: Single    Spouse Name: [name]  . Number of Children: 2  . Years of Education: 7   Occupational History  .      [employer]   Social History Main Topics  . Smoking status: Former Smoker -- 0.50 packs/day    Types: Cigarettes    Quit date: 03/22/2000  . Smokeless tobacco: Never Used  .  Alcohol Use: 0.0 oz/week    0 Standard drinks or equivalent per week     Comment: Drinks: 2-3 month. Last drink: Saturday  . Drug Use: No  . Sexual Activity: Not Asked   Other Topics Concern  . None   Social History Narrative   Lives at home with wife, child   No caffeine use   Additional Social History:   Sleep:   Fair, improved compared to prior    Appetite:  Good   Current Medications: Current Facility-Administered Medications  Medication Dose Route Frequency Provider Last Rate Last Dose  . acetaminophen (TYLENOL) tablet 650 mg  650 mg Oral Q6H PRN  Kerrie Buffalo, NP   650 mg at 06/13/15 2117  . alum & mag hydroxide-simeth (MAALOX/MYLANTA) 200-200-20 MG/5ML suspension 30 mL  30 mL Oral Q4H PRN Kerrie Buffalo, NP      . divalproex (DEPAKOTE ER) 24 hr tablet 1,500 mg  1,500 mg Oral q morning - 10a Jenne Campus, MD   1,500 mg at 06/20/15 1212  . magnesium hydroxide (MILK OF MAGNESIA) suspension 30 mL  30 mL Oral Daily PRN Kerrie Buffalo, NP      . nicotine (NICODERM CQ - dosed in mg/24 hours) patch 21 mg  21 mg Transdermal Daily Jenne Campus, MD   21 mg at 06/20/15 0818  . OLANZapine (ZYPREXA) tablet 7.5 mg  7.5 mg Oral QHS Myer Peer Cobos, MD   7.5 mg at 06/19/15 2238  . pantoprazole (PROTONIX) EC tablet 80 mg  80 mg Oral Daily Kerrie Buffalo, NP   80 mg at 06/20/15 0631  . pneumococcal 23 valent vaccine (PNU-IMMUNE) injection 0.5 mL  0.5 mL Intramuscular Tomorrow-1000 Fernando A Cobos, MD   0.5 mL at 06/16/15 0800  . sertraline (ZOLOFT) tablet 150 mg  150 mg Oral Daily Jenne Campus, MD   150 mg at 06/20/15 0817  . zolpidem (AMBIEN) tablet 10 mg  10 mg Oral QHS PRN Jenne Campus, MD   10 mg at 06/19/15 2239    Lab Results:  No results found for this or any previous visit (from the past 20 hour(s)).  Physical Findings: AIMS: Facial and Oral Movements Muscles of Facial Expression: None, normal Lips and Perioral Area: None, normal Jaw: None, normal Tongue: None, normal,Extremity Movements Upper (arms, wrists, hands, fingers): None, normal Lower (legs, knees, ankles, toes): None, normal, Trunk Movements Neck, shoulders, hips: None, normal, Overall Severity Severity of abnormal movements (highest score from questions above): None, normal Incapacitation due to abnormal movements: None, normal Patient's awareness of abnormal movements (rate only patient's report): No Awareness, Dental Status Current problems with teeth and/or dentures?: No Does patient usually wear dentures?: No  CIWA:  CIWA-Ar Total: 1 COWS:  COWS Total  Score: 1  Musculoskeletal: Strength & Muscle Tone: within normal limits Gait & Station: normal Patient leans: N/A  Psychiatric Specialty Exam: Review of Systems  Psychiatric/Behavioral: Positive for depression and suicidal ideas. Negative for hallucinations. The patient is nervous/anxious and has insomnia.   All other systems reviewed and are negative.  at this time does not endorse nausea, vomiting ,  Or abdominal pain  Blood pressure 133/79, pulse 59, temperature 98.3 F (36.8 C), temperature source Oral, resp. rate 18, height 6' 1"  (1.854 m), weight 203 lb 14.8 oz (92.5 kg).Body mass index is 26.91 kg/(m^2).  General Appearance: improved grooming  Eye Contact::  Good  Speech:  Normal Rate  Volume:  Normal  Mood:   Improving  Affect:  More reactive, still somewhat anxious   Thought Process:  Linear  Orientation:  Full (Time, Place, and Person)  Thought Content:  Denies hallucinations, delusions, and paranoia at this time  Suicidal Thoughts:  At this time denies any suicidal or self injurious ideations and denies any violent or homicidal ideations   Homicidal Thoughts:  No  Memory:  recent and remote grossly intact   Judgement:  Other:  improving   Insight:  improving   Psychomotor Activity:  Normal  Concentration:  Good  Recall:  Good  Fund of Knowledge:Good  Language: Good  Akathisia:  Negative  Handed:  Right  AIMS (if indicated):     Assets:  Communication Skills Desire for Improvement Resilience  ADL's:  Intact  Cognition: WNL  Sleep:  Number of Hours: 5  Assessment -  Ongoing partial but significant improvement of mood and affect. Some lingering anxiety. Denies any  SI.  No medication side effects at this time.   Treatment Plan Summary: Daily contact with patient to assess and evaluate symptoms and progress in treatment, Medication management, Plan inpatient admission and medications as below   Encourage ongoing milieu participation to work on coping skills and  symptom reduction Continue Depakote ER 1500  mgrs QDAY for history of mood disorder and history of seizure disorder  Continue  Zoloft  150 mgrs QDAY for management of depression and anxiety Continue Zyprexa  7.5  mgrs QHS for management of mood disorder, depression, psychotic symptoms and to help improve sleep Continue  Ambien to 10  mgrs QHS PRN for insomnia  Repeat Valproic Acid Serum level  Treatment team working on disposition planning    Neita Garnet,  MD  06/20/2015, 5:27 PM

## 2015-06-20 NOTE — Progress Notes (Signed)
Pt presented anxious on approach. Pt reported decreased depression and rates depression 1/10. Anxiety 3/10. Pt reported poor sleep last night and stated that his HS meds are not effective. Pt compliant with taking meds and attending groups. Pt hygiene is improving. Pt goal today is to work on d/c plan. A: Medications administered as ordered per MD. Verbal support given. Pt encouraged to attend groups. 15 minute checks performed for safety.  R: Pt receptive to tx.

## 2015-06-20 NOTE — [facility] Group Notes (Signed)
[facility] LCSW Group Therapy 06/20/2015 1:15 PM  Type of Therapy: Group Therapy- Emotion Regulation  Participation Level: Minimal  Participation Quality:  Distracting at times  Affect: Appropriate  Cognitive: Alert and Oriented   Insight:  Developing  Engagement in Therapy: Developing/Improving and Engaged   Modes of Intervention: Clarification, Confrontation, Discussion, Education, Exploration, Limit-setting, Orientation, Problem-solving, Rapport Building, Dance movement psychotherapisteality Testing, Socialization and Support  Summary of Progress/Problems: The topic for group today was emotional regulation. This group focused on both positive and negative emotion identification and allowed group members to process ways to identify feelings, regulate negative emotions, and find healthy ways to manage internal/external emotions. Group members were asked to reflect on a time when their reaction to an emotion led to a negative outcome and explored how alternative responses using emotion regulation would have benefited them. Group members were also asked to discuss a time when emotion regulation was utilized when a negative emotion was experienced. Pt observed to be attentive to group but participation implies that he is not completely invested in treatment as participation is surface level and minimal. Pt engages in side conversations and laughs at times. He did identify anxiety as an emotion he has difficulty regulating as it becomes overwhelming.   Chad CordialLauren Carter, LCSWA 06/20/2015 4:26 PM

## 2015-06-21 MED ORDER — LORAZEPAM 0.5 MG PO TABS
0.5000 mg | ORAL_TABLET | Freq: Four times a day (QID) | ORAL | Status: DC | PRN
  Administered 2015-06-21 – 2015-06-30 (×21): 0.5 mg via ORAL
  Filled 2015-06-21 (×22): qty 1

## 2015-06-21 MED ORDER — LORAZEPAM 0.5 MG PO TABS: 0.5000 mg | ORAL_TABLET | Freq: Three times a day (TID) | ORAL | Status: DC | PRN

## 2015-06-21 MED ORDER — GABAPENTIN 100 MG PO CAPS
100.0000 mg | ORAL_CAPSULE | Freq: Three times a day (TID) | ORAL | Status: DC
  Administered 2015-06-21 – 2015-06-22 (×4): 100 mg via ORAL
  Filled 2015-06-21 (×10): qty 1

## 2015-06-21 MED ORDER — QUETIAPINE FUMARATE 100 MG PO TABS
100.0000 mg | ORAL_TABLET | Freq: Once | ORAL | Status: AC
  Administered 2015-06-22: 100 mg via ORAL
  Filled 2015-06-21 (×2): qty 1

## 2015-06-21 NOTE — Progress Notes (Signed)
D: Patient rates his depression 5/10anxiey 7/10. Does not feel he has adequate medication adjustments to be able to dc home and effectively manage his depression and more specifically his anxiety. He does not feel he is at his baseline at this time. States xanax has been effective for him in the past and he would like to discuss medication adjustment of this with provider in the am. He spent most of his time this evening in the dayroom watching the [employer]. He has been very anxious this evening.  A: Encouragement and support given.  R: Continue to monitor for patient safety and medication effectiveness.

## 2015-06-21 NOTE — Progress Notes (Signed)
Adult Psychoeducational Group Note  Date:  06/21/2015 Time:  0900   Group Topic/Focus:  Orientation:   The focus of this group is to educate the patient on the purpose and policies of crisis stabilization and provide a format to answer questions about their admission.  The group details unit policies and expectations of patients while admitted.  Participation Level:  Did not attend  Participation Quality:    Affect:    Cognitive:    Insight:   Engagement in Group:    Modes of Intervention:    Additional Comments:    Ginnifer Creelman L 06/21/2015, 12:54 PM

## 2015-06-21 NOTE — Progress Notes (Signed)
D: Pt presented with flat affect this morning. Pt reported that he did not sleep well last night. Pt reported ongoing insomnia. Pt reported increased in anxiety. Pt requesting antianxiety med. Dr. Jama Flavorsobos informed. Pt reported decreasing depression this morning. Pt passive SI but denies SI at time of assessment. Pt verbally contracts not to harm self.  A: Medications administered as ordered per MD. Verbal support given. Pt encouraged to attend groups. 15 minute checks performed for safety.  R: Pt receptive to tx.

## 2015-06-21 NOTE — Plan of Care (Signed)
Problem: Alteration in mood Goal: STG-Patient is able to discuss feelings and issues (Patient is able to discuss feelings and issues leading to depression)  Outcome: Progressing Pt verbalized feeling increasingly anxious today and poor sleep at bedtime. Pt verbalized decreased depression today. Pt utilizing coping skills by speaking to staff and pacing hallway. Pt also requested antianxiety med to help relieve anxiety.

## 2015-06-21 NOTE — Progress Notes (Addendum)
Patient ID: Russell [name], male   DOB: 04/12/1971, 44 y.o.   MRN: [id number]  [facility] MD Progress Note  06/21/2015 6:17 PM Russell [name]  MRN:  [id number]    Subjective:  Patient states he has had a more difficult day yesterday and today. No clear reason or stressor, but has been feeling more anxious,  More depressed, ruminative. Interested in starting Xanax, which he states has helped in the past for management of anxiety and to decrease Tourette's symptoms. Denies medication side effects.  Objective :  I have discussed case with treatment team and have met with patient. Patient has continued to participate in groups- staff has noted and patient has reported increased anxiety today. He responds partially to support, empathy, and review of coping skills to address anxiety. Today presents somewhat depressed in mood, more constricted in affect- denies any SI. Behavior on unit in good control. Repeat Valproic Acid level 69 - therapeutic. Has had no seizures on unit thus far.    Principal Problem: Bipolar I disorder, most recent episode depressed ([place]) Diagnosis:   Patient Active Problem List   Diagnosis Date Noted  . Depression [F32.9] 06/13/2015  . Bipolar I disorder, most recent episode depressed ([place]) [F31.30] 06/13/2015  . 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 Severe major depression without psychotic features ([place]) [F32.2] 12/23/2014  . Alcohol use disorder, severe, dependence ([place]) [F10.20] 12/23/2014  . Tobacco use disorder [F17.200] 12/23/2014  . Seizure disorder ([place]) [Y56.389] 12/23/2014  . Stimulant use disorder ([place]) [F15.90]   . Opioid use disorder, severe, dependence ([place]) [F11.20]   . Self-inflicted laceration of wrist [S61.519A]    Total Time spent with patient: 20 minutes     Past Medical History:  Past Medical History  Diagnosis Date  . Hernia   . Bipolar 1 disorder ([place])   . Schizophrenia ([place])    . Depression   . PONV (postoperative nausea and vomiting)     Over twenty years ago.  Marland Kitchen Anxiety   . Epilepsy ([place])     last sz 03/09/15    Past Surgical History  Procedure Laterality Date  . Ankle fracture surgery  1994 - approximate  . Ligament repair  1998    right arm/wrist   Family History:  Family History  Problem Relation Age of Onset  . Diabetes Father   . Alcoholism Brother   . Cancer Maternal Grandmother   . Cancer Paternal Grandfather    Social History:  History  Alcohol Use  . 0.0 oz/week  . 0 Standard drinks or equivalent per week    Comment: Drinks: 2-3 month. Last drink: Saturday     History  Drug Use No    Social History   Social History  . Marital Status: Single    Spouse Name: [name]  . Number of Children: 2  . Years of Education: 7   Occupational History  .      [employer]   Social History Main Topics  . Smoking status: Former Smoker -- 0.50 packs/day    Types: Cigarettes    Quit date: 03/22/2000  . Smokeless tobacco: Never Used  . Alcohol Use: 0.0 oz/week    0 Standard drinks or equivalent per week     Comment: Drinks: 2-3 month. Last drink: Saturday  . Drug Use: No  . Sexual Activity: Not Asked   Other Topics Concern  . None   Social History Narrative   Lives at home with wife,  child   No caffeine use   Additional Social History:   Sleep:   Fair, improved compared to prior    Appetite:  Good   Current Medications: Current Facility-Administered Medications  Medication Dose Route Frequency Provider Last Rate Last Dose  . acetaminophen (TYLENOL) tablet 650 mg  650 mg Oral Q6H PRN Kerrie Buffalo, NP   650 mg at 06/13/15 2117  . alum & mag hydroxide-simeth (MAALOX/MYLANTA) 200-200-20 MG/5ML suspension 30 mL  30 mL Oral Q4H PRN Kerrie Buffalo, NP      . divalproex (DEPAKOTE ER) 24 hr tablet 1,500 mg  1,500 mg Oral q morning - 10a Jenne Campus, MD   1,500 mg at 06/21/15 0907  . gabapentin (NEURONTIN) capsule 100 mg  100  mg Oral TID Jenne Campus, MD   100 mg at 06/21/15 1602  . LORazepam (ATIVAN) tablet 0.5 mg  0.5 mg Oral Q6H PRN Jenne Campus, MD   0.5 mg at 06/21/15 1219  . magnesium hydroxide (MILK OF MAGNESIA) suspension 30 mL  30 mL Oral Daily PRN Kerrie Buffalo, NP      . nicotine (NICODERM CQ - dosed in mg/24 hours) patch 21 mg  21 mg Transdermal Daily Jenne Campus, MD   21 mg at 06/21/15 0908  . OLANZapine (ZYPREXA) tablet 7.5 mg  7.5 mg Oral QHS Myer Peer Cobos, MD   7.5 mg at 06/20/15 2342  . pantoprazole (PROTONIX) EC tablet 80 mg  80 mg Oral Daily Kerrie Buffalo, NP   80 mg at 06/21/15 0907  . pneumococcal 23 valent vaccine (PNU-IMMUNE) injection 0.5 mL  0.5 mL Intramuscular Tomorrow-1000 Fernando A Cobos, MD   0.5 mL at 06/16/15 0800  . sertraline (ZOLOFT) tablet 150 mg  150 mg Oral Daily Jenne Campus, MD   150 mg at 06/21/15 0908    Lab Results:  Results for orders placed or performed during the hospital encounter of 06/13/15 (from the past 48 hour(s))  Valproic acid level     Status: None   Collection Time: 06/20/15  6:20 PM  Result Value Ref Range   Valproic Acid Lvl 69 50.0 - 100.0 ug/mL    Comment: Performed at [facility]    Physical Findings: AIMS: Facial and Oral Movements Muscles of Facial Expression: None, normal Lips and Perioral Area: None, normal Jaw: None, normal Tongue: None, normal,Extremity Movements Upper (arms, wrists, hands, fingers): None, normal Lower (legs, knees, ankles, toes): None, normal, Trunk Movements Neck, shoulders, hips: None, normal, Overall Severity Severity of abnormal movements (highest score from questions above): None, normal Incapacitation due to abnormal movements: None, normal Patient's awareness of abnormal movements (rate only patient's report): No Awareness, Dental Status Current problems with teeth and/or dentures?: No Does patient usually wear dentures?: No  CIWA:  CIWA-Ar Total: 4 COWS:  COWS Total  Score: 2  Musculoskeletal: Strength & Muscle Tone: within normal limits Gait & Station: normal Patient leans: N/A  Psychiatric Specialty Exam: Review of Systems  Psychiatric/Behavioral: Positive for depression and suicidal ideas. Negative for hallucinations. The patient is nervous/anxious and has insomnia.   All other systems reviewed and are negative.  at this time does not endorse nausea, vomiting ,  Or abdominal pain  Blood pressure 115/72, pulse 64, temperature 97.3 F (36.3 C), temperature source Oral, resp. rate 16, height _0  (1.854 m), weight 203 lb 14.8 oz (92.5 kg).Body mass index is 26.91 kg/(m^2).  General Appearance: improved grooming  Eye Contact::  Good  Speech:  Normal Rate  Volume:  Normal  Mood:   Today feeling somewhat more depressed and anxious   Affect:  anxious   Thought Process:  Linear  Orientation:  Full (Time, Place, and Person)  Thought Content:  Denies hallucinations, delusions, and paranoia at this time  Suicidal Thoughts:  At this time denies any suicidal or self injurious ideations and denies any violent or homicidal ideations   Homicidal Thoughts:  No  Memory:  recent and remote grossly intact   Judgement:  Other:  improving   Insight:  improving   Psychomotor Activity:  Normal  Concentration:  Good  Recall:  Good  Fund of Knowledge:Good  Language: Good  Akathisia:  Negative  Handed:  Right  AIMS (if indicated):     Assets:  Communication Skills Desire for Improvement Resilience  ADL's:  Intact  Cognition: WNL  Sleep:  Number of Hours: 5  Assessment -   In general patient has improved compared to admission but states that over the last day or two has felt more depressed and anxious, although denies any suicidal ideations and continues to be able to contract for safety on unit. Denies medication side effects, and does not identify any specific trigger to cause worsened depression. Interested in starting Xanax which he has taken in the past-  we reviewed the rationale to avoid long term BZD use due to abuse potential, patient expressed understanding  Treatment Plan Summary: Daily contact with patient to assess and evaluate symptoms and progress in treatment, Medication management, Plan inpatient admission and medications as below   Encourage ongoing milieu participation to work on coping skills and symptom reduction Continue Depakote ER 1500  mgrs QDAY for history of mood disorder and history of seizure disorder  Continue  Zoloft  150 mgrs QDAY for management of depression and anxiety Continue Zyprexa  7.5  mgrs QHS for management of mood disorder, depression, psychotic symptoms and to help improve sleep D/c Ambien, as patient does not feel it is helping his insomnia particularly well at this time.  Add Ativan 0.5 mgrs Q 6 hours PRN anxiety or insomnia .  Add Neurontin 100 mgrs TID initially to address anxiety Treatment team working on disposition planning    Neita Garnet,  MD  06/21/2015, 6:17 PM

## 2015-06-22 ENCOUNTER — Ambulatory Visit: Payer: Medicaid Other | Admitting: Diagnostic Neuroimaging

## 2015-06-22 LAB — BASIC METABOLIC PANEL
Anion gap: 10 (ref 5–15)
BUN: 18 mg/dL (ref 6–20)
CALCIUM: 9.4 mg/dL (ref 8.9–10.3)
CO2: 24 mmol/L (ref 22–32)
CREATININE: 1.21 mg/dL (ref 0.61–1.24)
Chloride: 106 mmol/L (ref 101–111)
GFR calc Af Amer: >60 mL/min (ref >60)
GLUCOSE: 143 mg/dL — AB (ref 65–99)
POTASSIUM: 4.4 mmol/L (ref 3.5–5.1)
SODIUM: 140 mmol/L (ref 135–145)

## 2015-06-22 MED ORDER — GABAPENTIN 100 MG PO CAPS
200.0000 mg | ORAL_CAPSULE | Freq: Three times a day (TID) | ORAL | Status: DC
  Administered 2015-06-22 – 2015-06-25 (×6): 200 mg via ORAL
  Filled 2015-06-22 (×14): qty 2

## 2015-06-22 MED ORDER — OLANZAPINE 10 MG PO TABS
10.0000 mg | ORAL_TABLET | Freq: Every day | ORAL | Status: DC
  Administered 2015-06-22: 10 mg via ORAL
  Filled 2015-06-22 (×3): qty 1

## 2015-06-22 NOTE — Progress Notes (Signed)
D: Pt presents less anxious this evening. Pt was in the bed resting at the beginning of the shift. Pt noted for a lack of sleep for the previous nights. Pt is currently negative for any SI/HI/AVH.  A: Writer administered scheduled and prn medications to pt, per MD orders. Continued support and availability as needed was extended to this pt. Staff continue to monitor pt with q5915min checks.  R: No adverse drug reactions noted. Pt receptive to treatment. Pt remains safe at this time.

## 2015-06-22 NOTE — Tx Team (Signed)
Interdisciplinary Treatment Plan Update (Adult) Date: 06/22/2015   Date: 06/22/2015 5:43 PM  Progress in Treatment:  Attending groups: Yes  Participating in groups: Yes  Taking medication as prescribed: Yes  Tolerating medication: Yes  Family/Significant othe contact made: No, CSW assessing for appropriate contact Patient understands diagnosis: Yes Discussing patient identified problems/goals with staff: Yes  Medical problems stabilized or resolved: Yes  Denies suicidal/homicidal ideation: Yes Patient has not harmed self or Others: Yes   New problem(s) identified: None identified at this time.   Discharge Plan or Barriers: Pt will return home and follow-up with outpatient resources  Additional comments: n/a   Reason for Continuation of Hospitalization:  Anxiety Depression Medication stabilization Suicidal ideation  Estimated length of stay: 2-3 days  Review of initial/current patient goals per problem list:   1.  Goal(s): Patient will participate in aftercare plan  Met:  Yes  Target date: 3-5 days from date of admission   As evidenced by: Patient will participate within aftercare plan AEB aftercare provider and housing plan at discharge being identified.   06/14/15: Pt will return home and follow-up with outpatient resources  2.  Goal (s): Patient will exhibit decreased depressive symptoms and suicidal ideations.  Met:  goal progressing  Target date: 3-5 days from date of admission   As evidenced by: Patient will utilize self rating of depression at 3 or below and demonstrate decreased signs of depression or be deemed stable for discharge by MD.  06/14/15: Pt rates depression at 6/10; denies SI  06/19/15: Pt rates depression at 4/10; denies SI  06/22/15: Pt rates depression at higher rates; visible change in affect and less participation on the unit  3.  Goal(s): Patient will demonstrate decreased signs and symptoms of anxiety.  Met:  Goal progressing  Target  date: 3-5 days from date of admission   As evidenced by: Patient will utilize self rating of anxiety at 3 or below and demonstrated decreased signs of anxiety, or be deemed stable for discharge by MD  06/14/15: Pt rates anxiety at 7/10.  06/19/15: Pt rates anxiety at 5/10.  06/22/15: Pt becoming more anxious on the unit, reporting high anxiety.  Attendees:  Patient:    Family:    Physician: Dr. Cobos, MD  06/22/2015 5:43 PM  Nursing: Jennifer Clark, RN Case manager  06/22/2015 5:43 PM  Clinical Social Worker  Carter, LCSWA, MSW 06/22/2015 5:43 PM  Other: Valerie Enoch, [facility] 06/22/2015 5:43 PM  Clinical: Amanda Lawson, RN 06/22/2015 5:43 PM  Other: , RN Charge Nurse 06/22/2015 5:43 PM  Other:      Carter, LCSWA MSW   

## 2015-06-22 NOTE — [facility] Group Notes (Signed)
[facility] Note  06/22/2015 8:45 AM  Pt did not attend, declined invitation.   Chad CordialLauren Carter, LCSWA 06/22/2015 5:42 PM

## 2015-06-22 NOTE — [facility] Group Notes (Signed)
[facility] LCSW Group Therapy 06/22/2015 1:15pm  Type of Therapy: Group Therapy- Feelings Around Relapse and Recovery  Participation Level: Active   Participation Quality:  Appropriate  Affect:  Appropriate  Cognitive: Alert and Oriented   Insight:  Developing   Engagement in Therapy: Developing/Improving and Engaged   Modes of Intervention: Clarification, Confrontation, Discussion, Education, Exploration, Limit-setting, Orientation, Problem-solving, Rapport Building, Dance movement psychotherapisteality Testing, Socialization and Support  Summary of Progress/Problems: The topic for today was feelings about relapse. The group discussed what relapse prevention is to them and identified triggers that they are on the path to relapse. Members also processed their feeling towards relapse and were able to relate to common experiences. Group also discussed coping skills that can be used for relapse prevention.  Pt attended and was able to process the risk and benefit involved with having a trusted support to be honest with him and keep him accountable. Pt also reported that he does not have anyone at this time that can be that role to help him prevent relapse.    Therapeutic Modalities:   Cognitive Behavioral Therapy Solution-Focused Therapy Assertiveness Training Relapse Prevention Therapy    Russell SprinklesLauren Carter, LCSWA [phone] 06/22/2015 5:42 PM

## 2015-06-22 NOTE — Progress Notes (Signed)
D-pt has spent all morning in bed, pt c/o of high anxiety and some depression A-pt did not attend group this am but he did take his medications R-cont to monitor for safety

## 2015-06-22 NOTE — Progress Notes (Signed)
D: Pt presents anxious in affect and mood. Pt reports having racing thoughts at night that interferes with his ability to sleep. Pt reports a hx of hearing voices. Pt is currently denying any suicidal ideation. Pt verbally contracts for safety. Pt had a verbal confrontation with his roommate. Pt voluntarily walked into the hallway to deescalate the situation. Pt was moved to the quiet room to prevent any further confrontation for the night. Pt agrees to avoid confrontation with his roommate.   A: Writer administered scheduled and prn medications to pt, per MD orders. Continued support and availability as needed was extended to this pt. Staff continue to monitor pt with q2815min checks.  R: No adverse drug reactions noted. Pt receptive to treatment. Pt remains safe at this time.

## 2015-06-22 NOTE — Progress Notes (Signed)
Patient participated and attended karaoke.

## 2015-06-22 NOTE — Progress Notes (Signed)
Patient did not attend group, was sleeping. 

## 2015-06-22 NOTE — Progress Notes (Addendum)
Patient ID: Russell [name], male   DOB: 03/19/1971, 44 y.o.   MRN: [id number]  [facility] MD Progress Note  06/22/2015 3:03 PM Russell [name]  MRN:  [id number]    Subjective:  Patient reports some increase in anxiety and depression over the last two days, without any clear stressors or specific triggers . Reports increased panic type symptoms, to include palpitations and sense of fear/anxiety. Does feel that medication adjustments have helped , and today, although still feeling depressed, is feeling " a little better than yesterday".  At this time denies medication side effects.   Objective :  I have discussed case with treatment team and have met with patient. As noted, has reported increased symptoms of depression and anxiety over the last two days, and had symptoms of panic, particularly yesterday .  Today feeling partially better and has started attending groups today again. States " I know how important it is to socialize, if I isolate I am more likely to have negative thoughts ". Yesterday had passive SI in the context of increased anxiety and depression, today denies any suicidal ideation and contracts for safety.  Tolerating medications without significant side effects at this time. Behavior on unit in good control. No disruptive or agitated behaviors .      Principal Problem: Bipolar I disorder, most recent episode depressed ([place]) Diagnosis:   Patient Active Problem List   Diagnosis Date Noted  . Depression [F32.9] 06/13/2015  . Bipolar I disorder, most recent episode depressed ([place]) [F31.30] 06/13/2015  . 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 Severe major depression without psychotic features ([place]) [F32.2] 12/23/2014  . Alcohol use disorder, severe, dependence ([place]) [F10.20] 12/23/2014  . Tobacco use disorder [F17.200] 12/23/2014  . Seizure disorder ([place]) [B52.080] 12/23/2014  . Stimulant use disorder ([place]) [F15.90]    . Opioid use disorder, severe, dependence ([place]) [F11.20]   . Self-inflicted laceration of wrist [S61.519A]    Total Time spent with patient: 20 minutes     Past Medical History:  Past Medical History  Diagnosis Date  . Hernia   . Bipolar 1 disorder ([place])   . Schizophrenia ([place])   . Depression   . PONV (postoperative nausea and vomiting)     Over twenty years ago.  [place] Kitchen Anxiety   . Epilepsy ([place])     last sz 03/09/15    Past Surgical History  Procedure Laterality Date  . Ankle fracture surgery  1994 - approximate  . Ligament repair  1998    right arm/wrist   Family History:  Family History  Problem Relation Age of Onset  . Diabetes Father   . Alcoholism Brother   . Cancer Maternal Grandmother   . Cancer Paternal Grandfather    Social History:  History  Alcohol Use  . 0.0 oz/week  . 0 Standard drinks or equivalent per week    Comment: Drinks: 2-3 month. Last drink: Saturday     History  Drug Use No    Social History   Social History  . Marital Status: Single    Spouse Name: [name]  . Number of Children: 2  . Years of Education: 7   Occupational History  .      [employer]   Social History Main Topics  . Smoking status: Former Smoker -- 0.50 packs/day    Types: Cigarettes    Quit date: 03/22/2000  . Smokeless tobacco: Never Used  . Alcohol Use: 0.0 oz/week  0 Standard drinks or equivalent per week     Comment: Drinks: 2-3 month. Last drink: Saturday  . Drug Use: No  . Sexual Activity: Not Asked   Other Topics Concern  . None   Social History Narrative   Lives at home with wife, child   No caffeine use   Additional Social History:   Sleep:   Fair, improved compared to prior    Appetite:  Good   Current Medications: Current Facility-Administered Medications  Medication Dose Route Frequency Provider Last Rate Last Dose  . acetaminophen (TYLENOL) tablet 650 mg  650 mg Oral Q6H PRN Kerrie Buffalo, NP   650 mg at 06/13/15 2117  . alum &  mag hydroxide-simeth (MAALOX/MYLANTA) 200-200-20 MG/5ML suspension 30 mL  30 mL Oral Q4H PRN Kerrie Buffalo, NP      . divalproex (DEPAKOTE ER) 24 hr tablet 1,500 mg  1,500 mg Oral q morning - 10a Jenne Campus, MD   1,500 mg at 06/22/15 0738  . gabapentin (NEURONTIN) capsule 200 mg  200 mg Oral TID Jenne Campus, MD      . LORazepam (ATIVAN) tablet 0.5 mg  0.5 mg Oral Q6H PRN Jenne Campus, MD   0.5 mg at 06/22/15 0738  . magnesium hydroxide (MILK OF MAGNESIA) suspension 30 mL  30 mL Oral Daily PRN Kerrie Buffalo, NP      . nicotine (NICODERM CQ - dosed in mg/24 hours) patch 21 mg  21 mg Transdermal Daily Jenne Campus, MD   21 mg at 06/22/15 0738  . OLANZapine (ZYPREXA) tablet 10 mg  10 mg Oral QHS Myer Peer Cobos, MD      . pantoprazole (PROTONIX) EC tablet 80 mg  80 mg Oral Daily Kerrie Buffalo, NP   80 mg at 06/22/15 0641  . pneumococcal 23 valent vaccine (PNU-IMMUNE) injection 0.5 mL  0.5 mL Intramuscular Tomorrow-1000 Fernando A Cobos, MD   0.5 mL at 06/16/15 0800  . sertraline (ZOLOFT) tablet 150 mg  150 mg Oral Daily Jenne Campus, MD   150 mg at 06/22/15 8119    Lab Results:  Results for orders placed or performed during the hospital encounter of 06/13/15 (from the past 48 hour(s))  Valproic acid level     Status: None   Collection Time: 06/20/15  6:20 PM  Result Value Ref Range   Valproic Acid Lvl 69 50.0 - 100.0 ug/mL    Comment: Performed at [facility]    Physical Findings: AIMS: Facial and Oral Movements Muscles of Facial Expression: None, normal Lips and Perioral Area: None, normal Jaw: None, normal Tongue: None, normal,Extremity Movements Upper (arms, wrists, hands, fingers): None, normal Lower (legs, knees, ankles, toes): None, normal, Trunk Movements Neck, shoulders, hips: None, normal, Overall Severity Severity of abnormal movements (highest score from questions above): None, normal Incapacitation due to abnormal movements: None,  normal Patient's awareness of abnormal movements (rate only patient's report): No Awareness, Dental Status Current problems with teeth and/or dentures?: No Does patient usually wear dentures?: No  CIWA:  CIWA-Ar Total: 4 COWS:  COWS Total Score: 2  Musculoskeletal: Strength & Muscle Tone: within normal limits Gait & Station: normal Patient leans: N/A  Psychiatric Specialty Exam: Review of Systems  Psychiatric/Behavioral: Positive for depression and suicidal ideas. Negative for hallucinations. The patient is nervous/anxious and has insomnia.   All other systems reviewed and are negative.  at this time does not endorse nausea, vomiting ,  Or abdominal pain  Blood  pressure 114/95, pulse 64, temperature 97.4 F (36.3 C), temperature source Oral, resp. rate 16, height _0  (1.854 m), weight 203 lb 14.8 oz (92.5 kg).Body mass index is 26.91 kg/(m^2).  General Appearance: improved grooming  Eye Contact::  Good  Speech:  Normal Rate  Volume:  Normal  Mood:   Depressed   Affect:   Still anxious but reports feeling better today compared to yesterday  Thought Process:  Linear  Orientation:  Full (Time, Place, and Person)  Thought Content:  Denies hallucinations, delusions, and paranoia at this time  Suicidal Thoughts:  At this time denies any suicidal or self injurious ideations and denies any violent or homicidal ideations   Homicidal Thoughts:  No  Memory:  recent and remote grossly intact   Judgement:  Other:  improving   Insight:  improving   Psychomotor Activity:  Normal  Concentration:  Good  Recall:  Good  Fund of Knowledge:Good  Language: Good  Akathisia:  Negative  Handed:  Right  AIMS (if indicated):     Assets:  Communication Skills Desire for Improvement Resilience  ADL's:  Intact  Cognition: WNL  Sleep:  Number of Hours: 4  Assessment -  Over the last two days patient has had worsening of anxiety and depression. He can't identify any specific trigger. Approaching  discharge could be playing a role, as patient reports anxiety about his ability to return to premorbid level of  Functioning. Today still depressed, anxious, but presenting partially improved  Compared to yesterday. He does feel medications have helped ,and denies any side effects at present .  Treatment Plan Summary: Daily contact with patient to assess and evaluate symptoms and progress in treatment, Medication management, Plan inpatient admission and medications as below   Encourage ongoing milieu participation to work on coping skills and symptom reduction Continue Depakote ER 1500  mgrs QDAY for history of mood disorder and history of seizure disorder  Continue  Zoloft  150 mgrs QDAY for management of depression and anxiety Increase  Zyprexa  To 10   mgrs QHS for management of mood disorder, depression, night time anxiety Continue  Ativan 0.5 mgrs Q 6 hours PRN anxiety or insomnia .  Increase Neurontin to 200  mgrs TID initially to address ongoing  anxiety Obtain BMP, particularly to rule out hyponatremia as possible medication side effect Treatment team working on disposition planning    Neita Garnet,  MD  06/22/2015, 3:03 PM

## 2015-06-23 MED ORDER — TRAZODONE HCL 100 MG PO TABS
100.0000 mg | ORAL_TABLET | Freq: Once | ORAL | Status: DC
  Filled 2015-06-23: qty 1

## 2015-06-23 MED ORDER — HYDROXYZINE HCL 50 MG PO TABS
ORAL_TABLET | ORAL | Status: AC
  Filled 2015-06-23: qty 2

## 2015-06-23 MED ORDER — HYDROXYZINE HCL 50 MG PO TABS
100.0000 mg | ORAL_TABLET | Freq: Once | ORAL | Status: AC
  Administered 2015-06-23: 100 mg via ORAL

## 2015-06-23 MED ORDER — OLANZAPINE 7.5 MG PO TABS
15.0000 mg | ORAL_TABLET | Freq: Every day | ORAL | Status: DC
  Administered 2015-06-23 – 2015-06-25 (×3): 15 mg via ORAL
  Filled 2015-06-23 (×4): qty 2

## 2015-06-23 NOTE — [facility] Group Notes (Signed)
[facility] Group Notes:  (Nursing/MHT/Case Management/Adjunct)  Date:  06/23/2015  Time:  9:54 AM  Type of Therapy:  Psychoeducational Skills  Participation Level:  Did Not Attend  Participation Quality:  Did Not Attend  Affect:  Did Not Attend  Cognitive:  Did Not Attend  Insight:  None  Engagement in Group:  Did Not Attend  Modes of Intervention:  Did Not Attend  Summary of Progress/Problems: Pt did not attend patient self inventory group.   Russell BalintForrest, Russell [name] 06/23/2015, 9:54 AM

## 2015-06-23 NOTE — Plan of Care (Signed)
Problem: Alteration in mood; excessive anxiety as evidenced by: Goal: LTG-Patient's behavior demonstrates decreased anxiety (Patient's behavior demonstrates anxiety and he/she is utilizing learned coping skills to deal with anxiety-producing situations)  Outcome: Progressing Pt presented less anxious during the evening shift of 06/22/15 ( in comparison to 06/21/15)

## 2015-06-23 NOTE — Progress Notes (Signed)
Adult Psychoeducational Group Note  Date:  06/23/2015 Time:  9:21 PM  Group Topic/Focus:  Wrap-Up Group:   The focus of this group is to help patients review their daily goal of treatment and discuss progress on daily workbooks.  Participation Level:  Active  Participation Quality:  Appropriate and Attentive  Affect:  Appropriate  Cognitive:  Appropriate  Insight: Appropriate and Good  Engagement in Group:  Engaged  Modes of Intervention:  Education  Additional Comments:  Pt overall had a good day. Pt goal is to be better tomorrow then he was today.   Merlinda FrederickKeshia S Raesean Bartoletti 06/23/2015, 9:21 PM

## 2015-06-23 NOTE — [facility] Group Notes (Signed)
[facility] Group Notes:  (Clinical Social Work)  06/23/2015     1:15-2:15PM  Summary of Progress/Problems:   The main focus of today's process group was to discuss patient feelings about being in the hospital and how they can focus on the positive aspects of it.  Comparison and contrast was done of people's feelings and symptoms, and they provided ideas and supports to each other.  [name] said he has Tourette's and that this has been a bad day because he got bad news he did not want to discuss.  Type of Therapy:  Group Therapy - Process   Participation Level:  Active  Participation Quality:  Appropriate, Attentive and Sharing  Affect:  Depressed and Flat  Cognitive:  Oriented  Insight:  Improving  Engagement in Therapy:  Limited  Modes of Intervention:  Education, Motivational Interviewing  Ambrose MantleMareida Grossman-Orr, LCSW 06/23/2015, 3:16 PM

## 2015-06-23 NOTE — Progress Notes (Signed)
[facility] MD Progress Note  06/23/2015 1:58 PM Laval Cafaro  MRN:  [id number]    Subjective:  States he had a bad experience yesterday. Talked to g/f on the phone and she told him to kill himself. States he is now very depressed and anxious. He has not talked to her since. Pt is having SI with plan to shoot himself with a gun. He has intent to do so on d/c. Pt denies access to guns and states he can find one. He would committ suicide to make her feel bad. Denies HI.  He had a panic attack last night. Sleep was good.  States meds are not working and doses are too low. States the only thing that has worked were narcotics.   At this time denies medication side effects.   Objective :  I have met with patient. As noted, has reported increased symptoms of depression and anxiety over the last day, and had symptoms of panic, particularly yesterday .  Today feeling worse and has been attending groups.  Tolerating medications without significant side effects at this time. Behavior on unit in good control. No disruptive or agitated behaviors .      Principal Problem: Bipolar I disorder, most recent episode depressed ([place]) Diagnosis:   Patient Active Problem List   Diagnosis Date Noted  . Depression [F32.9] 06/13/2015  . Bipolar I disorder, most recent episode depressed ([place]) [F31.30] 06/13/2015  . 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 Severe major depression without psychotic features ([place]) [F32.2] 12/23/2014  . Alcohol use disorder, severe, dependence ([place]) [F10.20] 12/23/2014  . Tobacco use disorder [F17.200] 12/23/2014  . Seizure disorder ([place]) [J44.920] 12/23/2014  . Stimulant use disorder ([place]) [F15.90]   . Opioid use disorder, severe, dependence ([place]) [F11.20]   . Self-inflicted laceration of wrist [S61.519A]    Total Time spent with patient: 20 minutes     Past Medical History:  Past Medical History  Diagnosis Date  .  Hernia   . Bipolar 1 disorder ([place])   . Schizophrenia ([place])   . Depression   . PONV (postoperative nausea and vomiting)     Over twenty years ago.  Marland Kitchen Anxiety   . Epilepsy ([place])     last sz 03/09/15    Past Surgical History  Procedure Laterality Date  . Ankle fracture surgery  1994 - approximate  . Ligament repair  1998    right arm/wrist   Family History:  Family History  Problem Relation Age of Onset  . Diabetes Father   . Alcoholism Brother   . Cancer Maternal Grandmother   . Cancer Paternal Grandfather    Social History:  History  Alcohol Use  . 0.0 oz/week  . 0 Standard drinks or equivalent per week    Comment: Drinks: 2-3 month. Last drink: Saturday     History  Drug Use No    Social History   Social History  . Marital Status: Single    Spouse Name: [name]  . Number of Children: 2  . Years of Education: 7   Occupational History  .      [employer]   Social History Main Topics  . Smoking status: Former Smoker -- 0.50 packs/day    Types: Cigarettes    Quit date: 03/22/2000  . Smokeless tobacco: Never Used  . Alcohol Use: 0.0 oz/week    0 Standard drinks or equivalent per week     Comment: Drinks: 2-3 month. Last  drink: Saturday  . Drug Use: No  . Sexual Activity: Not Asked   Other Topics Concern  . None   Social History Narrative   Lives at home with wife, child   No caffeine use   Additional Social History:   Sleep:   Fair, improved compared to prior    Appetite:  Good   Current Medications: Current Facility-Administered Medications  Medication Dose Route Frequency Provider Last Rate Last Dose  . acetaminophen (TYLENOL) tablet 650 mg  650 mg Oral Q6H PRN Kerrie Buffalo, NP   650 mg at 06/13/15 2117  . alum & mag hydroxide-simeth (MAALOX/MYLANTA) 200-200-20 MG/5ML suspension 30 mL  30 mL Oral Q4H PRN Kerrie Buffalo, NP      . divalproex (DEPAKOTE ER) 24 hr tablet 1,500 mg  1,500 mg Oral q morning - 10a Jenne Campus, MD   1,500 mg  at 06/23/15 1157  . gabapentin (NEURONTIN) capsule 200 mg  200 mg Oral TID Jenne Campus, MD   200 mg at 06/23/15 1158  . LORazepam (ATIVAN) tablet 0.5 mg  0.5 mg Oral Q6H PRN Jenne Campus, MD   0.5 mg at 06/23/15 1202  . magnesium hydroxide (MILK OF MAGNESIA) suspension 30 mL  30 mL Oral Daily PRN Kerrie Buffalo, NP      . nicotine (NICODERM CQ - dosed in mg/24 hours) patch 21 mg  21 mg Transdermal Daily Jenne Campus, MD   21 mg at 06/22/15 0738  . OLANZapine (ZYPREXA) tablet 10 mg  10 mg Oral QHS Jenne Campus, MD   10 mg at 06/22/15 2203  . pantoprazole (PROTONIX) EC tablet 80 mg  80 mg Oral Daily Kerrie Buffalo, NP   80 mg at 06/23/15 1158  . pneumococcal 23 valent vaccine (PNU-IMMUNE) injection 0.5 mL  0.5 mL Intramuscular Tomorrow-1000 Fernando A Cobos, MD   0.5 mL at 06/16/15 0800  . sertraline (ZOLOFT) tablet 150 mg  150 mg Oral Daily Jenne Campus, MD   150 mg at 06/23/15 1158    Lab Results:  Results for orders placed or performed during the hospital encounter of 06/13/15 (from the past 48 hour(s))  Basic metabolic panel     Status: Abnormal   Collection Time: 06/22/15  6:30 PM  Result Value Ref Range   Sodium 140 135 - 145 mmol/L   Potassium 4.4 3.5 - 5.1 mmol/L   Chloride 106 101 - 111 mmol/L   CO2 24 22 - 32 mmol/L   Glucose, Bld 143 (H) 65 - 99 mg/dL   BUN 18 6 - 20 mg/dL   Creatinine, Ser 1.21 0.61 - 1.24 mg/dL   Calcium 9.4 8.9 - 10.3 mg/dL   GFR calc non Af Amer >60 >60 mL/min   GFR calc Af Amer >60 >60 mL/min    Comment: (NOTE) The eGFR has been calculated using the CKD EPI equation. This calculation has not been validated in all clinical situations. eGFR's persistently <60 mL/min signify possible Chronic Kidney Disease.    Anion gap 10 5 - 15    Comment: Performed at [facility]    Physical Findings: AIMS: Facial and Oral Movements Muscles of Facial Expression: None, normal Lips and Perioral Area: None, normal Jaw: None,  normal Tongue: None, normal,Extremity Movements Upper (arms, wrists, hands, fingers): None, normal Lower (legs, knees, ankles, toes): None, normal, Trunk Movements Neck, shoulders, hips: None, normal, Overall Severity Severity of abnormal movements (highest score from questions above): None, normal Incapacitation due  to abnormal movements: None, normal Patient's awareness of abnormal movements (rate only patient's report): No Awareness, Dental Status Current problems with teeth and/or dentures?: No Does patient usually wear dentures?: No  CIWA:  CIWA-Ar Total: 4 COWS:  COWS Total Score: 2  Musculoskeletal: Strength & Muscle Tone: within normal limits Gait & Station: normal Patient leans: N/A  Psychiatric Specialty Exam: Review of Systems  Psychiatric/Behavioral: Positive for depression and suicidal ideas. Negative for hallucinations. The patient is nervous/anxious and has insomnia.   All other systems reviewed and are negative.    Blood pressure 117/87, pulse 85, temperature 97.7 F (36.5 C), temperature source Oral, resp. rate 20, height 6' 1"  (1.854 m), weight 92.5 kg (203 lb 14.8 oz).Body mass index is 26.91 kg/(m^2).  General Appearance: improved grooming  Eye Contact::  Poor  Speech:  Normal Rate  Volume:  Normal  Mood:   Depressed   Affect:   Depressed and anxious  Thought Process:  Linear  Orientation:  Full (Time, Place, and Person)  Thought Content:  Denies hallucinations, delusions, and paranoia at this time  Suicidal Thoughts:  +SI with plan and intent  Homicidal Thoughts:  No  Memory:  recent and remote grossly intact   Judgement:  Other:  improving   Insight:  improving   Psychomotor Activity:  Normal  Concentration:  Good  Recall:  Good  Fund of Knowledge:Good  Language: Good  Akathisia:  Negative  Handed:  Right  AIMS (if indicated):     Assets:  Communication Skills Desire for Improvement Resilience  ADL's:  Intact  Cognition: WNL  Sleep:  Number of  Hours: 6  Assessment -   patient has had worsening of anxiety and depression.  Approaching discharge could be playing a role, as patient reports anxiety about his ability to return to premorbid level of Functioning. Felt medications were helping until yesterday and denies any side effects at present .  Treatment Plan Summary: Daily contact with patient to assess and evaluate symptoms and progress in treatment, Medication management, Plan inpatient admission and medications as below   Encourage ongoing milieu participation to work on coping skills and symptom reduction Continue Depakote ER 1500  mgrs QDAY for history of mood disorder and history of seizure disorder  Continue  Zoloft 150 mgrs QDAY for management of depression and anxiety Increase Zyprexa To 15  mgrs QHS for management of mood disorder, depression, night time anxiety Continue Ativan 0.5 mgrs Q 6 hours PRN anxiety or insomnia .  Neurontin to 200  mgrs TID initially to address ongoing  anxiety   Reviewed labs - glu 143, Na low Treatment team working on disposition planning    Charlcie Cradle,  MD  06/23/2015, 1:58 PM

## 2015-06-23 NOTE — Progress Notes (Signed)
D Cristal DeerChristopher is status quo. He refused to wake up at morning med pass ( 0800-0900) and slept soundly from his return from breakfast to about 1130., at which point he presented to the main nurses' station and demanded his meds be given to him " right now".  He avoids eye contact. He is very sad, depressed and flat today and he says " Im not going to talk about it" and when this writr asks him what he is talking about, he says .Marland Kitchen..." it was last night and I, not going to tell you".     A He  did  complete his daily assessment after he got OOB at 1130 and on it he wrote he has experienced  SI today and  he rated his depression, hopelessness and anxiety " 7/6/4", respectively. He is compliant withhis medicaitons, when he does get up. HE says he slept " better last night thatn he has since he has been here".   R. Safety in place.

## 2015-06-23 NOTE — [facility] Group Notes (Signed)
[facility] Group Notes:  (Nursing/MHT/Case Management/Adjunct)  Date:  06/23/2015  Time:  11:37 AM  Type of Therapy:  Psychoeducational Skills  Participation Level:  Active  Participation Quality:  Appropriate  Affect:  Appropriate  Cognitive:  Appropriate  Insight:  Appropriate  Engagement in Group:  Engaged  Modes of Intervention:  Discussion  Summary of Progress/Problems: Pt did attend self healthy coping skills group.   Khylon Davies Shanta 06/23/2015, 11:37 AM 

## 2015-06-24 MED ORDER — HYDROXYZINE HCL 25 MG PO TABS
25.0000 mg | ORAL_TABLET | Freq: Four times a day (QID) | ORAL | Status: DC | PRN
  Administered 2015-06-24 – 2015-06-29 (×4): 25 mg via ORAL
  Filled 2015-06-24 (×5): qty 1

## 2015-06-24 NOTE — Progress Notes (Signed)
Patient ID: Russell [name], male   DOB: 1971-05-10, 44 y.o.   MRN: [id number]  [facility] MD Progress Note  06/24/2015 10:41 AM Kendal Raffo  MRN:  [id number]    Subjective: Pt seen in his room today. Pt is lying in bed with eyes closed. States he did not sleep well and is tired. He wants to sleep now. Denies depression. Denies SI/HI. Denies AVH.   Denies issues with meds today and denies SE.   At this time denies medication side effects.   Objective :  I have met with patient and reviewed the chart. Pt has been reporting more depression since the incident with his wife.  As noted, has reported increased symptoms of depression and anxiety over the last day, and had symptoms of panic, particularly yesterday.  Tolerating medications without significant side effects at this time. Behavior on unit in good control. No disruptive or agitated behaviors .      Principal Problem: Bipolar I disorder, most recent episode depressed ([place]) Diagnosis:   Patient Active Problem List   Diagnosis Date Noted  . Depression [F32.9] 06/13/2015  . Bipolar I disorder, most recent episode depressed ([place]) [F31.30] 06/13/2015  . 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 Severe major depression without psychotic features ([place]) [F32.2] 12/23/2014  . Alcohol use disorder, severe, dependence ([place]) [F10.20] 12/23/2014  . Tobacco use disorder [F17.200] 12/23/2014  . Seizure disorder ([place]) [P29.518] 12/23/2014  . Stimulant use disorder ([place]) [F15.90]   . Opioid use disorder, severe, dependence ([place]) [F11.20]   . Self-inflicted laceration of wrist [S61.519A]    Total Time spent with patient: 20 minutes     Past Medical History:  Past Medical History  Diagnosis Date  . Hernia   . Bipolar 1 disorder ([place])   . Schizophrenia ([place])   . Depression   . PONV (postoperative nausea and vomiting)     Over twenty years ago.  Marland Kitchen Anxiety   . Epilepsy ([place])    last sz 03/09/15    Past Surgical History  Procedure Laterality Date  . Ankle fracture surgery  1994 - approximate  . Ligament repair  1998    right arm/wrist   Family History:  Family History  Problem Relation Age of Onset  . Diabetes Father   . Alcoholism Brother   . Cancer Maternal Grandmother   . Cancer Paternal Grandfather    Social History:  History  Alcohol Use  . 0.0 oz/week  . 0 Standard drinks or equivalent per week    Comment: Drinks: 2-3 month. Last drink: Saturday     History  Drug Use No    Social History   Social History  . Marital Status: Single    Spouse Name: [name]  . Number of Children: 2  . Years of Education: 7   Occupational History  .      [employer]   Social History Main Topics  . Smoking status: Former Smoker -- 0.50 packs/day    Types: Cigarettes    Quit date: 03/22/2000  . Smokeless tobacco: Never Used  . Alcohol Use: 0.0 oz/week    0 Standard drinks or equivalent per week     Comment: Drinks: 2-3 month. Last drink: Saturday  . Drug Use: No  . Sexual Activity: Not Asked   Other Topics Concern  . None   Social History Narrative   Lives at home with wife, child   No caffeine use   Additional Social History:  Sleep:   Fair, improved compared to prior    Appetite:  Good   Current Medications: Current Facility-Administered Medications  Medication Dose Route Frequency Provider Last Rate Last Dose  . acetaminophen (TYLENOL) tablet 650 mg  650 mg Oral Q6H PRN Kerrie Buffalo, NP   650 mg at 06/13/15 2117  . alum & mag hydroxide-simeth (MAALOX/MYLANTA) 200-200-20 MG/5ML suspension 30 mL  30 mL Oral Q4H PRN Kerrie Buffalo, NP      . divalproex (DEPAKOTE ER) 24 hr tablet 1,500 mg  1,500 mg Oral q morning - 10a Jenne Campus, MD   1,500 mg at 06/23/15 1157  . gabapentin (NEURONTIN) capsule 200 mg  200 mg Oral TID Jenne Campus, MD   200 mg at 06/23/15 1822  . LORazepam (ATIVAN) tablet 0.5 mg  0.5 mg Oral Q6H PRN Jenne Campus, MD   0.5 mg at 06/23/15 2221  . magnesium hydroxide (MILK OF MAGNESIA) suspension 30 mL  30 mL Oral Daily PRN Kerrie Buffalo, NP      . nicotine (NICODERM CQ - dosed in mg/24 hours) patch 21 mg  21 mg Transdermal Daily Jenne Campus, MD   21 mg at 06/23/15 1330  . OLANZapine (ZYPREXA) tablet 15 mg  15 mg Oral QHS Charlcie Cradle, MD   15 mg at 06/23/15 2221  . pantoprazole (PROTONIX) EC tablet 80 mg  80 mg Oral Daily Kerrie Buffalo, NP   80 mg at 06/23/15 1158  . pneumococcal 23 valent vaccine (PNU-IMMUNE) injection 0.5 mL  0.5 mL Intramuscular Tomorrow-1000 Fernando A Cobos, MD   0.5 mL at 06/16/15 0800  . sertraline (ZOLOFT) tablet 150 mg  150 mg Oral Daily Jenne Campus, MD   150 mg at 06/23/15 1158    Lab Results:  Results for orders placed or performed during the hospital encounter of 06/13/15 (from the past 48 hour(s))  Basic metabolic panel     Status: Abnormal   Collection Time: 06/22/15  6:30 PM  Result Value Ref Range   Sodium 140 135 - 145 mmol/L   Potassium 4.4 3.5 - 5.1 mmol/L   Chloride 106 101 - 111 mmol/L   CO2 24 22 - 32 mmol/L   Glucose, Bld 143 (H) 65 - 99 mg/dL   BUN 18 6 - 20 mg/dL   Creatinine, Ser 1.21 0.61 - 1.24 mg/dL   Calcium 9.4 8.9 - 10.3 mg/dL   GFR calc non Af Amer >60 >60 mL/min   GFR calc Af Amer >60 >60 mL/min    Comment: (NOTE) The eGFR has been calculated using the CKD EPI equation. This calculation has not been validated in all clinical situations. eGFR's persistently <60 mL/min signify possible Chronic Kidney Disease.    Anion gap 10 5 - 15    Comment: Performed at [facility]    Physical Findings: AIMS: Facial and Oral Movements Muscles of Facial Expression: None, normal Lips and Perioral Area: None, normal Jaw: None, normal Tongue: None, normal,Extremity Movements Upper (arms, wrists, hands, fingers): None, normal Lower (legs, knees, ankles, toes): None, normal, Trunk Movements Neck, shoulders, hips:  None, normal, Overall Severity Severity of abnormal movements (highest score from questions above): None, normal Incapacitation due to abnormal movements: None, normal Patient's awareness of abnormal movements (rate only patient's report): No Awareness, Dental Status Current problems with teeth and/or dentures?: No Does patient usually wear dentures?: No  CIWA:  CIWA-Ar Total: 4 COWS:  COWS Total Score: 2  Musculoskeletal: Strength &  Muscle Tone: within normal limits Gait & Station: normal Patient leans: N/A  Psychiatric Specialty Exam: Review of Systems  Gastrointestinal: Positive for abdominal pain. Negative for nausea, vomiting, diarrhea and constipation.  Psychiatric/Behavioral: Negative for depression, suicidal ideas and hallucinations. The patient has insomnia. The patient is not nervous/anxious.   All other systems reviewed and are negative.    Blood pressure 115/72, pulse 87, temperature 97.5 F (36.4 C), temperature source Oral, resp. rate 20, height 6' 1"  (1.854 m), weight 92.5 kg (203 lb 14.8 oz).Body mass index is 26.91 kg/(m^2).  General Appearance: improved grooming  Eye Contact::  Poor- eyes closed the whole time  Speech:  Normal Rate  Volume:  Normal  Mood:   Depressed   Affect:   Depressed and anxious  Thought Process:  Linear  Orientation:  Full (Time, Place, and Person)  Thought Content:  Denies hallucinations, delusions, and paranoia at this time  Suicidal Thoughts:  Denies SI today  Homicidal Thoughts:  No  Memory:  recent and remote grossly intact   Judgement:  Other:  improving   Insight:  improving   Psychomotor Activity:  Normal  Concentration:  Good  Recall:  Good  Fund of Knowledge:Good  Language: Good  Akathisia:  Negative  Handed:  Right  AIMS (if indicated):     Assets:  Communication Skills Desire for Improvement Resilience  ADL's:  Intact  Cognition: WNL  Sleep:  Number of Hours: 6.5  Assessment -   patient has had worsening of anxiety  and depression.  Approaching discharge could be playing a role, as patient reports anxiety about his ability to return to premorbid level of Functioning. Felt medications were helping until yesterday and denies any side effects at present .  Treatment Plan Summary: Daily contact with patient to assess and evaluate symptoms and progress in treatment, Medication management, Plan inpatient admission and medications as below   Encourage ongoing milieu participation to work on coping skills and symptom reduction Continue Depakote ER 1500  mgrs QDAY for history of mood disorder and history of seizure disorder  Continue  Zoloft 150 mgrs QDAY for management of depression and anxiety Increase Zyprexa To 15  mgrs QHS for management of mood disorder, depression, night time anxiety Continue Ativan 0.5 mgrs Q 6 hours PRN anxiety or insomnia .  Neurontin to 200  mgrs TID initially to address ongoing  anxiety   Reviewed labs - glu 143, Na low Treatment team working on disposition planning    Charlcie Cradle,  MD  06/24/2015, 10:41 AM

## 2015-06-24 NOTE — Progress Notes (Signed)
Writer spoke with patient 1:1 and he reports that he and his wife are having some personal issues since him being here. Patient has changed his code so that she is unable to continue calling and upsetting him. Patient is concerned that he will not rest well tonight so an order was obtained to aid with sleep. Support and encouragement given, safety maintained on unit with 15 min checks.

## 2015-06-24 NOTE — Progress Notes (Signed)
[name] is seen OOB UAL on the 400 hall today..he tolerates this well. He slept until after 1130 ( as usual) but immediately took his daily meds upon returning to the unit after going to the cafe for lunch.   A He is attentive in group today and he spoke with this Clinical research associatewriter, at length, about plans he is trying to make, for his " new" life, without his GF. While he is grieving the loss of their relationship, he also is hopeful to establish new friends, to continue to parent his daughter and to learn to be helatheir.    R Safety in [place and poc cont.

## 2015-06-24 NOTE — Progress Notes (Signed)
Adult Psychoeducational Group Note  Date:  06/24/2015 Time:  9:02 PM  Group Topic/Focus:  Wrap-Up Group:   The focus of this group is to help patients review their daily goal of treatment and discuss progress on daily workbooks.  Participation Level:  Active  Participation Quality:  Appropriate and Attentive  Affect:  Appropriate  Cognitive:  Appropriate  Insight: Appropriate and Good  Engagement in Group:  Engaged  Modes of Intervention:  Education  Additional Comments:  Pt had a normal day. Pt goal is to be better than tomorrow.   [name] 06/24/2015, 9:02 PM

## 2015-06-24 NOTE — Progress Notes (Signed)
Pt reports he is still having a difficult time with his anxiety over the break up of his long time relationship with his girlfriend.  He does not feel she is supportive and does not understand what he is dealing with in his life.  He feels he needs to move on, but he does want to nurture his relationship with his 44 yo daughter.  He has been in the dayroom watching TV.  He contracts for safety, but does admit that he still has suicidal thoughts at times.  He denies HI/AVH.  He makes his needs known to staff.  Support and encouragement offered.  Pt is pleasant and cooperative with staff.  Discharge plans still in process.  Safety maintained with q15 minute checks.

## 2015-06-24 NOTE — [facility] Group Notes (Signed)
[facility] Group Notes:  (Clinical Social Work)  06/24/2015  1:15-2:15PM  Summary of Progress/Problems:   The main focus of today's process group was to   1)  discuss the importance of adding supports  2)  define health supports versus unhealthy supports  3)  identify the patient's current unhealthy supports and plan how to handle them  4)  Identify the patient's current healthy supports and plan what to add.  An emphasis was placed on using counselor, doctor, therapy groups, 12-step groups, and problem-specific support groups to expand supports.    The patient expressed full comprehension of the concepts presented, and agreed that there is a need to add more supports.  The patient stated he does not feel he can do the work necessary to develop new supports.  We discussed the variety of additional supports that can possibly be made available to him since he has had 5 hospitalizations, such as [facility], Transitional Care Team which he will be working with, assistance with applications for transportation, even Doctor, hospitalAssertive Community Treatment Team.  He was interested in the possibilities, and said he will talk to his CSW.  He was initially resistant to having group because he wanted to watch football on TV, stating that it is the most therapeutic time of the week for him.  Type of Therapy:  Process Group with Motivational Interviewing  Participation Level:  Active  Participation Quality:  Attentive, Resistant and Sharing  Affect:  Blunted  Cognitive:  Appropriate  Insight:  Developing/Improving  Engagement in Therapy:  Engaged  Modes of Intervention:   Education, Support and Processing, Activity  Ambrose MantleMareida Grossman-Orr, LCSW 06/24/2015

## 2015-06-24 NOTE — Progress Notes (Signed)
Adult Psychoeducational Group Note  Date:  06/24/2015 Time:  0900  Group Topic/Focus:  Orientation:   The focus of this group is to educate the patient on the purpose and policies of crisis stabilization and provide a format to answer questions about their admission.  The group details unit policies and expectations of patients while admitted.  Participation Level:  Did Not Attend  Participation Quality:    Affect:    Cognitive:    Insight:   Engagement in Group:    Modes of Intervention:    Additional Comments:   Russell [name] L 06/24/2015, 10:02 AM

## 2015-06-25 MED ORDER — SERTRALINE HCL 100 MG PO TABS
200.0000 mg | ORAL_TABLET | Freq: Every day | ORAL | Status: DC
  Administered 2015-06-26 – 2015-06-30 (×5): 200 mg via ORAL
  Filled 2015-06-25 (×7): qty 2
  Filled 2015-06-25: qty 28
  Filled 2015-06-25: qty 2

## 2015-06-25 MED ORDER — HYDROXYZINE HCL 50 MG PO TABS
50.0000 mg | ORAL_TABLET | Freq: Every evening | ORAL | Status: DC | PRN
  Administered 2015-06-25 – 2015-06-29 (×10): 50 mg via ORAL
  Filled 2015-06-25 (×18): qty 1

## 2015-06-25 MED ORDER — GABAPENTIN 300 MG PO CAPS
300.0000 mg | ORAL_CAPSULE | Freq: Three times a day (TID) | ORAL | Status: DC
  Administered 2015-06-25 – 2015-06-27 (×4): 300 mg via ORAL
  Filled 2015-06-25 (×11): qty 1

## 2015-06-25 NOTE — [facility] Group Notes (Signed)
Adult Psychoeducational Group Note  Date:  06/25/2015 Time:  10:18 PM  Group Topic/Focus:  Wrap-Up Group:   The focus of this group is to help patients review their daily goal of treatment and discuss progress on daily workbooks.  Participation Level:  Minimal  Participation Quality:  Attentive  Affect:  Flat  Cognitive:  Appropriate  Insight: Good  Engagement in Group:  Limited  Modes of Intervention:  Discussion  Additional Comments:  Cristal DeerChristopher stated that his day was "not to good".  He expressed that he was dealing with things from home.  He also stated that "the last 2-3 days have been tough".  The writer asked his was there anything that staff could do to make things easier for him and he stated "not at this time".  Caroll RancherLindsay, Kaylynne Andres A 06/25/2015, 10:18 PM

## 2015-06-25 NOTE — Progress Notes (Signed)
Patient ID: Russell [name], male   DOB: 10-24-1970, 44 y.o.   MRN: [id number]  [facility] MD Progress Note  06/25/2015 4:38 PM Russell [name]  MRN:  [id number]    Subjective:  After a period of improvement , feeling better, patient has again been feeling worse, more depressed over the last few days. He states he is unsure what may have caused exacerbation of symptoms, but attributes a lot of it to marital discord. States wife is angry with him and during phone conversations with her has said she wished he would have committed suicide. He states he is unsure as to whether he wants to or would be able to return to his home after discharge. He denies medication side effects.   Objective :  I have met with patient and have discussed case with treatment team. Patient presenting with increased depression , constricted affect, and attributing some of this mood worsening to poor relationship with wife . Denies medication side effects. No seizures /seizure like activities on unit . At this time denies plan or intention of hurting self and contracts for safety on the unit .  No disruptive or agitated behaviors on unit. Calm and cooperative upon approach.  Principal Problem: Bipolar I disorder, most recent episode depressed ([place]) Diagnosis:   Patient Active Problem List   Diagnosis Date Noted  . Depression [F32.9] 06/13/2015  . Bipolar I disorder, most recent episode depressed ([place]) [F31.30] 06/13/2015  . 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 Severe major depression without psychotic features ([place]) [F32.2] 12/23/2014  . Alcohol use disorder, severe, dependence ([place]) [F10.20] 12/23/2014  . Tobacco use disorder [F17.200] 12/23/2014  . Seizure disorder ([place]) [G83.662] 12/23/2014  . Stimulant use disorder ([place]) [F15.90]   . Opioid use disorder, severe, dependence ([place]) [F11.20]   . Self-inflicted laceration of wrist [S61.519A]    Total Time  spent with patient: 25 minutes     Past Medical History:  Past Medical History  Diagnosis Date  . Hernia   . Bipolar 1 disorder ([place])   . Schizophrenia ([place])   . Depression   . PONV (postoperative nausea and vomiting)     Over twenty years ago.  Marland Kitchen Anxiety   . Epilepsy ([place])     last sz 03/09/15    Past Surgical History  Procedure Laterality Date  . Ankle fracture surgery  1994 - approximate  . Ligament repair  1998    right arm/wrist   Family History:  Family History  Problem Relation Age of Onset  . Diabetes Father   . Alcoholism Brother   . Cancer Maternal Grandmother   . Cancer Paternal Grandfather    Social History:  History  Alcohol Use  . 0.0 oz/week  . 0 Standard drinks or equivalent per week    Comment: Drinks: 2-3 month. Last drink: Saturday     History  Drug Use No    Social History   Social History  . Marital Status: Single    Spouse Name: [name]  . Number of Children: 2  . Years of Education: 7   Occupational History  .      [employer]   Social History Main Topics  . Smoking status: Former Smoker -- 0.50 packs/day    Types: Cigarettes    Quit date: 03/22/2000  . Smokeless tobacco: Never Used  . Alcohol Use: 0.0 oz/week    0 Standard drinks or equivalent per week     Comment: Drinks:  2-3 month. Last drink: Saturday  . Drug Use: No  . Sexual Activity: Not Asked   Other Topics Concern  . None   Social History Narrative   Lives at home with wife, child   No caffeine use   Additional Social History:   Sleep:   Fair   Appetite:  Good   Current Medications: Current Facility-Administered Medications  Medication Dose Route Frequency Provider Last Rate Last Dose  . acetaminophen (TYLENOL) tablet 650 mg  650 mg Oral Q6H PRN Kerrie Buffalo, NP   650 mg at 06/13/15 2117  . alum & mag hydroxide-simeth (MAALOX/MYLANTA) 200-200-20 MG/5ML suspension 30 mL  30 mL Oral Q4H PRN Kerrie Buffalo, NP      . divalproex (DEPAKOTE ER) 24 hr  tablet 1,500 mg  1,500 mg Oral q morning - 10a Jenne Campus, MD   1,500 mg at 06/25/15 1207  . gabapentin (NEURONTIN) capsule 200 mg  200 mg Oral TID Jenne Campus, MD   200 mg at 06/25/15 1209  . hydrOXYzine (ATARAX/VISTARIL) tablet 25 mg  25 mg Oral Q6H PRN Harriet Butte, NP   25 mg at 06/24/15 2224  . LORazepam (ATIVAN) tablet 0.5 mg  0.5 mg Oral Q6H PRN Jenne Campus, MD   0.5 mg at 06/25/15 0036  . magnesium hydroxide (MILK OF MAGNESIA) suspension 30 mL  30 mL Oral Daily PRN Kerrie Buffalo, NP      . nicotine (NICODERM CQ - dosed in mg/24 hours) patch 21 mg  21 mg Transdermal Daily Jenne Campus, MD   21 mg at 06/25/15 1208  . OLANZapine (ZYPREXA) tablet 15 mg  15 mg Oral QHS Charlcie Cradle, MD   15 mg at 06/24/15 2225  . pantoprazole (PROTONIX) EC tablet 80 mg  80 mg Oral Daily Kerrie Buffalo, NP   80 mg at 06/25/15 1208  . pneumococcal 23 valent vaccine (PNU-IMMUNE) injection 0.5 mL  0.5 mL Intramuscular Tomorrow-1000 Nakaila Freeze A Xochil Shanker, MD   0.5 mL at 06/16/15 0800  . sertraline (ZOLOFT) tablet 150 mg  150 mg Oral Daily Jenne Campus, MD   150 mg at 06/25/15 1209    Lab Results:  No results found for this or any previous visit (from the past 20 hour(s)).  Physical Findings: AIMS: Facial and Oral Movements Muscles of Facial Expression: None, normal Lips and Perioral Area: None, normal Jaw: None, normal Tongue: None, normal,Extremity Movements Upper (arms, wrists, hands, fingers): None, normal Lower (legs, knees, ankles, toes): None, normal, Trunk Movements Neck, shoulders, hips: None, normal, Overall Severity Severity of abnormal movements (highest score from questions above): None, normal Incapacitation due to abnormal movements: None, normal Patient's awareness of abnormal movements (rate only patient's report): No Awareness, Dental Status Current problems with teeth and/or dentures?: No Does patient usually wear dentures?: No  CIWA:  CIWA-Ar Total: 4 COWS:   COWS Total Score: 2  Musculoskeletal: Strength & Muscle Tone: within normal limits Gait & Station: normal Patient leans: N/A  Psychiatric Specialty Exam: Review of Systems  Gastrointestinal: Positive for abdominal pain. Negative for nausea, vomiting, diarrhea and constipation.  Psychiatric/Behavioral: Negative for depression, suicidal ideas and hallucinations. The patient has insomnia. The patient is not nervous/anxious.   All other systems reviewed and are negative.    Blood pressure 112/74, pulse 59, temperature 97.7 F (36.5 C), temperature source Oral, resp. rate 18, height 6' 1"  (1.854 m), weight 203 lb 14.8 oz (92.5 kg).Body mass index is 26.91 kg/(m^2).  General Appearance: improved  grooming  Eye Contact::  Good   Speech:  Normal Rate  Volume:  Normal  Mood:    Remains depressed   Affect:    Constricted   Thought Process:  Linear  Orientation:  Full (Time, Place, and Person)  Thought Content:   No hallucinations, no delusions , not internally preoccupied   Suicidal Thoughts:  Denies  Any plan or intention of hurting self or of SI and contracts for safety on the unit. Does state he has had fleeting passive thoughts of death.  Homicidal Thoughts:  No denies any violent ideations towards anyone and specifically also denies any thoughts of wanting to hurt wife   Memory:  recent and remote grossly intact   Judgement:  Other:  improving   Insight:  improving   Psychomotor Activity:   Decreased   Concentration:  Good  Recall:  Good  Fund of Knowledge:Good  Language: Good  Akathisia:  Negative  Handed:  Right  AIMS (if indicated):     Assets:  Communication Skills Desire for Improvement Resilience  ADL's:  Intact  Cognition: WNL  Sleep:  Number of Hours: 4.75  Assessment -   Patient had been gradually improving until recently. Reportedly has had negative feedback and interactions with his wife , and states she has made statement that she would have preferred if he committed  suicide . Patient denies any plan or intention of hurting self or of suicide, but endorses passive thoughts at times. Presents sad, constricted in affect, and has been isolating more. Denies medication side effects.  Treatment Plan Summary: Daily contact with patient to assess and evaluate symptoms and progress in treatment, Medication management, Plan inpatient admission and medications as below   Encourage ongoing milieu participation to work on coping skills and symptom reduction Continue Depakote ER 1500  mgrs QDAY for history of mood disorder and history of seizure disorder  Increase   Zoloft to 200  mgrs QDAY for management of depression and anxiety Continue Zyprexa  15  mgrs QHS for management of mood disorder, depression, night time anxiety Continue Ativan 0.5 mgrs Q 6 hours PRN anxiety or insomnia .  Increase Neurontin to 300   mgrs TID initially to address ongoing  Anxiety I have offered patient to facilitate family therapy session, but patient thinks she would not come to it .      Neita Garnet,  MD  06/25/2015, 4:38 PM

## 2015-06-25 NOTE — Progress Notes (Signed)
Recreation Therapy Notes  Date: 11.07.2016 Time: 9:30am Location: [address] Day[address]  Group Topic: Stress Management  Goal Area(s) Addresses:  Patient will actively participate in stress management techniques presented during session.   Behavioral Response: Did not attend.   Ervin Rothbauer L Therese Rocco, LRT/CTRS        Niyla Marone L 06/25/2015 10:29 AM 

## 2015-06-25 NOTE — Plan of Care (Signed)
Problem: Ineffective individual coping Goal: STG: Patient will remain free from self harm Outcome: Progressing Patient has not engaged in self harm though does admit to passive SI at times. Verbally contracts for safety.  Problem: Diagnosis: Increased Risk For Suicide Attempt Goal: STG-Patient Will Comply With Medication Regime Outcome: Progressing Patient is med compliant however refuses meds until around noon when he gets up.

## 2015-06-25 NOTE — Progress Notes (Addendum)
Patient slept all morning stating to this writer, "I always take my morning meds around lunch time." States he continues to have poor sleep. Eye contact brief, affect flat, sad with congruent mood. Rates depression and anxiety both at an 8/10 and hopelessness at a 7/10. States his goal today is "to work on relationships and to try to stay engaged." Denies pain or physical problems. Medicated per orders. Support and reassurance offered. When asked about suicidal thoughts patient responded, "not right now." He verbally contracts for safety. No SI/HI and remains safe. Will continue to monitor closely. Russell MarseillesFriedman, Russell [name]

## 2015-06-25 NOTE — [facility] Group Notes (Signed)
[facility]  06/25/2015 8:45 AM  Pt did not attend, declined invitation.   Chad CordialLauren Carter, LCSWA 06/25/2015 9:33 AM

## 2015-06-25 NOTE — [facility] Group Notes (Signed)
[facility] LCSW Group Therapy  06/25/2015 1:15pm  Type of Therapy:  Group Therapy vercoming Obstacles  Participation Level:  Minimal  Participation Quality:  Appropriate  Affect:  Flat and Tearful  Cognitive:  Appropriate and Oriented  Insight:  Developing/Improving and Improving  Engagement in Therapy:  Improving  Modes of Intervention:  Discussion, Exploration, Problem-solving and Support  Description of Group:   In this group patients will be encouraged to explore what they see as obstacles to their own wellness and recovery. They will be guided to discuss their thoughts, feelings, and behaviors related to these obstacles. The group will process together ways to cope with barriers, with attention given to specific choices patients can make. Each patient will be challenged to identify changes they are motivated to make in order to overcome their obstacles. This group will be process-oriented, with patients participating in exploration of their own experiences as well as giving and receiving support and challenge from other group members.  Summary of Patient Progress: Pt observed to be reserved and tearful in group and has trouble articulating his feelings or a plan for coping with increased depression and anxiety. Pt described that his closest supports have made devastating comments over the last 2 days and he verbalizes that he is left "at a loss" at this point. He is unable to begin formulating a plan for next steps or identify his feelings.    Therapeutic Modalities:   Cognitive Behavioral Therapy Solution Focused Therapy Motivational Interviewing Relapse Prevention Therapy   Chad CordialLauren Carter, LCSWA 06/25/2015 3:37 PM

## 2015-06-26 MED ORDER — MIRTAZAPINE 15 MG PO TABS
15.0000 mg | ORAL_TABLET | Freq: Every day | ORAL | Status: DC
  Administered 2015-06-26 – 2015-06-29 (×4): 15 mg via ORAL
  Filled 2015-06-26 (×2): qty 1
  Filled 2015-06-26: qty 14
  Filled 2015-06-26 (×3): qty 1

## 2015-06-26 NOTE — Progress Notes (Signed)
Patient approached this writer, tearful, shaking. "I'm just not doing well. I'm scared about the future. He mentioned ECT but there are so many unknowns. Is there something else you can give me?" (Patient had received ativan shortly after 11). He continues to ruminate about wife's statement that she wished he had completed suicide. States his racing thoughts are worse at night therefore his inability to sleep is extremely stressful. States he sleeps in because he can't fall asleep until the wee hours. Spoke of wife and states she has had several losses as a young adult and hasn't received any therapy. Realizes his depression and SI might trigger unresolved emotions for her. He states she will not come visit, bring their daughter or come for a family session with tx team. "She works and has no help with our 44 year old." States he has not had a civil conversation with her as of late. Lengthy 1:1 provided for patient.  Emotional support given. Patient given vistaril 25mg  per prn order. Patient much calmer after time with staff. He contracts for safety and states his suicidal thoughts are more prominent at night. Promises to alert staff should they become so strong he has urge to act on them. Will continue to monitor closely. Lawrence MarseillesFriedman, Vanecia Limpert Eakes

## 2015-06-26 NOTE — Progress Notes (Signed)
Recreation Therapy Notes  Animal-Assisted Activity (AAA) Program Checklist/Progress Notes Patient Eligibility Criteria Checklist & Daily Group note for Rec Tx Intervention  Date: 11.08.2016 Time: 2:45pm Location: [address] Dayroom    AAA/T Program Assumption of Risk Form signed by Patient/ or Parent Legal Guardian yes  Patient is free of allergies or sever asthma yes  Patient reports no fear of animals yes  Patient reports no history of cruelty to animals yes  Patient understands his/her participation is voluntary yes  Behavioral Response: Did not attend.    Russell [name], LRT/CTRS        Russell [name] L 06/26/2015 3:15 PM 

## 2015-06-26 NOTE — [facility] Group Notes (Signed)
[facility] LCSW Group Therapy 06/26/2015 1:15 PM  Type of Therapy: Group Therapy- Feelings about Diagnosis  Pt did not attend, declined invitation.   Chad CordialLauren Carter, LCSWA 06/26/2015 2:26 PM

## 2015-06-26 NOTE — Progress Notes (Signed)
   D: Pt asked the writer about sleep meds. Informed the writer that he'd gotten 100mg  of vistaril and was requesting to get 300mg  tonight.  After checking, pt informed writer that he "wasn't sure what he'd gotten". Writer attempted to encourage pt to participate in the milieu rather than stay in bed til noon, in hopes of being more tired at night. Pt reports that he spoke to his wife whom continues to make derogatory statements to him. Stated as a result of their conversation, he's experiencing "self harm thoughts". Pt had no other questions or concerns.   A: Order given for vistaril hs with a mrx1.  Support and encouragement was offered. 15 min checks continued for safety.  R: Pt remains safe.

## 2015-06-26 NOTE — Progress Notes (Signed)
D: Depression 7/10 Anxiety 7/10. He is endorsing passive SI. Contracts for safety .Feels Zoloft is not helping his depression. He is still requesting xanax for his anxiety. Stating this is the best regimen that he has ever been on. However, he was taking these meds without a prescription at home. Had been using family member's xanax. He is exhibiting poor coping skills as he is attempting to treat his anxiety and depression with medications only. I asked him what his coping skills are and what he has learned since being admitted. He stated he does not have any. He does not like to read books, write in a journal or use any other distraction techniques. I encouraged [name] to focus on more coping skills obtained during group therapy instead of solely requesting medications.  A: Encouragement and support given. R: Continue to monitor for patient safety and medication effectiveness.

## 2015-06-26 NOTE — Progress Notes (Signed)
Patient ID: Russell [name], male   DOB: 11/14/1970, 44 y.o.   MRN: [id number]  [facility] MD Progress Note  06/26/2015 5:09 PM Russell [name]  MRN:  [id number]    Subjective:   Patient has been feeling more depressed, sad over recent days. Attributes this to recent phone conversation with wife , during which he states wife was unsupportive and made statement to the effect that she would have wanted for him to commit suicide . Patient states he had been feeling better until then, and is now ruminating about his relationship, possible separation, and acknowledges to guilty feelings regarding how his mood disorder has affected his marital relationship. Of note, he denies any suicidal plan or intention on unit, and states he feels safe on the unit at this time. He does state " I don't feel ready to leave yet, I am not doing well, I am feeling really bad". Denies medication side effects.  We discussed medication issues at length- patient has been on Zyprexa over recent days, and it does not appear to be working well for him- as noted, mood remains depressed, affect constricted . Also, reports he is still not sleeping well, waking up often in spite of Zyprexa. He is hoping to find a medication more effective for him.  Objective :  I have met with patient and have discussed case with treatment team. Patient remains significantly depressed, sad, some milieu participation , but has become more withdrawn , spending more time in bed .  As noted, we discussed medication issues- patient not sleeping well on current medication regimen.  No seizures /seizure like activities on unit . At this time continues to deny any  plan or intention of hurting self and contracts for safety on the unit , stating that he would contact staff should these thoughts emerge. He does , however, report worsening depression, sadness, and is apprehensive and not feeling safe to discharge at this time. We discussed treatment options, to  include possible ECT - patient expressed interest in this treatment modality- of note, however, patient does have a history of seizure disorder and is on Depakote ER for management. Agrees to Remeron trial with the goal of improving depression and helping insomnia- side effects, risks discussed. Denies medication side effects .Marland Kitchen  No disruptive or agitated behaviors on unit.  Principal Problem: Bipolar I disorder, most recent episode depressed ([place]) Diagnosis:   Patient Active Problem List   Diagnosis Date Noted  . Depression [F32.9] 06/13/2015  . Bipolar I disorder, most recent episode depressed ([place]) [F31.30] 06/13/2015  . 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 Severe major depression without psychotic features ([place]) [F32.2] 12/23/2014  . Alcohol use disorder, severe, dependence ([place]) [F10.20] 12/23/2014  . Tobacco use disorder [F17.200] 12/23/2014  . Seizure disorder ([place]) [B04.888] 12/23/2014  . Stimulant use disorder ([place]) [F15.90]   . Opioid use disorder, severe, dependence ([place]) [F11.20]   . Self-inflicted laceration of wrist [S61.519A]    Total Time spent with patient: 25 minutes     Past Medical History:  Past Medical History  Diagnosis Date  . Hernia   . Bipolar 1 disorder ([place])   . Schizophrenia ([place])   . Depression   . PONV (postoperative nausea and vomiting)     Over twenty years ago.  Marland Kitchen Anxiety   . Epilepsy ([place])     last sz 03/09/15    Past Surgical History  Procedure Laterality Date  . Ankle fracture surgery  1994 - approximate  . Ligament repair  1998    right arm/wrist   Family History:  Family History  Problem Relation Age of Onset  . Diabetes Father   . Alcoholism Brother   . Cancer Maternal Grandmother   . Cancer Paternal Grandfather    Social History:  History  Alcohol Use  . 0.0 oz/week  . 0 Standard drinks or equivalent per week    Comment: Drinks: 2-3 month. Last drink: Saturday      History  Drug Use No    Social History   Social History  . Marital Status: Single    Spouse Name: [name]  . Number of Children: 2  . Years of Education: 7   Occupational History  .      [employer]   Social History Main Topics  . Smoking status: Former Smoker -- 0.50 packs/day    Types: Cigarettes    Quit date: 03/22/2000  . Smokeless tobacco: Never Used  . Alcohol Use: 0.0 oz/week    0 Standard drinks or equivalent per week     Comment: Drinks: 2-3 month. Last drink: Saturday  . Drug Use: No  . Sexual Activity: Not Asked   Other Topics Concern  . None   Social History Narrative   Lives at home with wife, child   No caffeine use   Additional Social History:   Sleep:   Fair   Appetite:  Good   Current Medications: Current Facility-Administered Medications  Medication Dose Route Frequency Provider Last Rate Last Dose  . acetaminophen (TYLENOL) tablet 650 mg  650 mg Oral Q6H PRN Kerrie Buffalo, NP   650 mg at 06/13/15 2117  . alum & mag hydroxide-simeth (MAALOX/MYLANTA) 200-200-20 MG/5ML suspension 30 mL  30 mL Oral Q4H PRN Kerrie Buffalo, NP      . divalproex (DEPAKOTE ER) 24 hr tablet 1,500 mg  1,500 mg Oral q morning - 10a Jenne Campus, MD   1,500 mg at 06/26/15 1103  . gabapentin (NEURONTIN) capsule 300 mg  300 mg Oral TID Jenne Campus, MD   300 mg at 06/26/15 1629  . hydrOXYzine (ATARAX/VISTARIL) tablet 25 mg  25 mg Oral Q6H PRN Harriet Butte, NP   25 mg at 06/26/15 1300  . hydrOXYzine (ATARAX/VISTARIL) tablet 50 mg  50 mg Oral QHS,MR X 1 Spencer E Simon, PA-C   50 mg at 06/25/15 2319  . LORazepam (ATIVAN) tablet 0.5 mg  0.5 mg Oral Q6H PRN Jenne Campus, MD   0.5 mg at 06/26/15 1106  . magnesium hydroxide (MILK OF MAGNESIA) suspension 30 mL  30 mL Oral Daily PRN Kerrie Buffalo, NP      . mirtazapine (REMERON) tablet 15 mg  15 mg Oral QHS Fernando A Cobos, MD      . nicotine (NICODERM CQ - dosed in mg/24 hours) patch 21 mg  21 mg Transdermal  Daily Jenne Campus, MD   21 mg at 06/26/15 1104  . pantoprazole (PROTONIX) EC tablet 80 mg  80 mg Oral Daily Kerrie Buffalo, NP   80 mg at 06/26/15 1103  . pneumococcal 23 valent vaccine (PNU-IMMUNE) injection 0.5 mL  0.5 mL Intramuscular Tomorrow-1000 Fernando A Cobos, MD   0.5 mL at 06/16/15 0800  . sertraline (ZOLOFT) tablet 200 mg  200 mg Oral Daily Jenne Campus, MD   200 mg at 06/26/15 1104    Lab Results:  No results found for this or any previous visit (from  the past 48 hour(s)).  Physical Findings: AIMS: Facial and Oral Movements Muscles of Facial Expression: None, normal Lips and Perioral Area: None, normal Jaw: None, normal Tongue: None, normal,Extremity Movements Upper (arms, wrists, hands, fingers): None, normal Lower (legs, knees, ankles, toes): None, normal, Trunk Movements Neck, shoulders, hips: None, normal, Overall Severity Severity of abnormal movements (highest score from questions above): None, normal Incapacitation due to abnormal movements: None, normal Patient's awareness of abnormal movements (rate only patient's report): No Awareness, Dental Status Current problems with teeth and/or dentures?: No Does patient usually wear dentures?: No  CIWA:  CIWA-Ar Total: 4 COWS:  COWS Total Score: 2  Musculoskeletal: Strength & Muscle Tone: within normal limits Gait & Station: normal Patient leans: N/A  Psychiatric Specialty Exam: Review of Systems  Gastrointestinal: Positive for abdominal pain. Negative for nausea, vomiting, diarrhea and constipation.  Psychiatric/Behavioral: Negative for depression, suicidal ideas and hallucinations. The patient has insomnia. The patient is not nervous/anxious.   All other systems reviewed and are negative.    Blood pressure 109/67, pulse 70, temperature 97.5 F (36.4 C), temperature source Oral, resp. rate 16, height 6' 1"  (1.854 m), weight 203 lb 14.8 oz (92.5 kg).Body mass index is 26.91 kg/(m^2).  General Appearance:  improved grooming  Eye Contact::  Good   Speech:  Normal Rate  Volume:  Normal  Mood:    Still significantly depressed   Affect:    Constricted , close to tears at times   Thought Process:  Linear  Orientation:  Full (Time, Place, and Person)  Thought Content:   No hallucinations, no delusions , not internally preoccupied   Suicidal Thoughts:  Denies  Any plan or intention of hurting self or of SI  On unit and contracts for safety on the unit.    Homicidal Thoughts:  No denies any violent ideations towards anyone and specifically also denies any thoughts of wanting to hurt wife   Memory:  recent and remote grossly intact   Judgement:  Other:  improving   Insight:  improving   Psychomotor Activity:   Decreased   Concentration:  Good  Recall:  Good  Fund of Knowledge:Good  Language: Good  Akathisia:  Negative  Handed:  Right  AIMS (if indicated):     Assets:  Communication Skills Desire for Improvement Resilience  ADL's:  Intact  Cognition: WNL  Sleep:  Number of Hours: 6  Assessment -   Patient  Presenting with worsened depression over the last few days, which he attributes to marital issues, mainly wife being angry with him and having made some statement on the phone that he took as her preferring if he had died . He is ruminative about this. He presents sad, has become more withdrawn and avoidant, and has had stated he does not feel safe for discharge at this time. He has denied any suicidal plan or intention and has contracted for safety on unit.  Zyprexa not felt to be helpful, and patient reports he is not sleeping well on this medication at present. Insomnia is an ongoing complaint for patient. Denies medication side effects.  Treatment Plan Summary: Daily contact with patient to assess and evaluate symptoms and progress in treatment, Medication management, Plan inpatient admission and medications as below   Encourage ongoing milieu participation to work on coping skills and  symptom reduction Continue Depakote ER 1500  mgrs QDAY for history of mood disorder and history of seizure disorder  Continue    Zoloft 200  mgrs QDAY  for management of depression and anxiety D/C Zyprexa   Continue Ativan 0.5 mgrs Q 6 hours PRN anxiety or insomnia .  Continue  Neurontin  300   mgrs TID initially to address ongoing  Anxiety Start Remeron 15 mgrs QHS for depression and for insomnia.  Patient expressing interest in ECT if considered appropriate - has history of seizures and is on Depakote ER for seizure control. Staff will send ECT consult/referral to Dr. Sebastian Ache for consideration of appropriateness of this treatment . Patient interested in family meeting with wife and is hoping she will agree to come. CSW , patient to work on scheduling of it .      Neita Garnet,  MD  06/26/2015, 5:09 PM

## 2015-06-26 NOTE — Tx Team (Signed)
Interdisciplinary Treatment Plan Update (Adult) Date: 06/26/2015   Date: 06/26/2015 12:09 PM  Progress in Treatment:  Attending groups: Yes  Participating in groups: Yes  Taking medication as prescribed: Yes  Tolerating medication: Yes  Family/Significant othe contact made: No, CSW assessing for appropriate contact Patient understands diagnosis: Yes Discussing patient identified problems/goals with staff: Yes  Medical problems stabilized or resolved: Yes  Denies suicidal/homicidal ideation: Yes Patient has not harmed self or Others: Yes   New problem(s) identified: None identified at this time.   Discharge Plan or Barriers: Pt will return home and follow-up with outpatient resources  Additional comments:  MD is suggesting referral for ECT; MD requests family session with wife  Reason for Continuation of Hospitalization:  Anxiety Depression Medication stabilization Suicidal ideation  Estimated length of stay: 2-3 days  Review of initial/current patient goals per problem list:   1.  Goal(s): Patient will participate in aftercare plan  Met:  Yes  Target date: 3-5 days from date of admission   As evidenced by: Patient will participate within aftercare plan AEB aftercare provider and housing plan at discharge being identified.   06/14/15: Pt will return home and follow-up with outpatient resources  2.  Goal (s): Patient will exhibit decreased depressive symptoms and suicidal ideations.  Met:  goal progressing  Target date: 3-5 days from date of admission   As evidenced by: Patient will utilize self rating of depression at 3 or below and demonstrate decreased signs of depression or be deemed stable for discharge by MD.  06/14/15: Pt rates depression at 6/10; denies SI  06/19/15: Pt rates depression at 4/10; denies SI  06/22/15: Pt rates depression at higher rates; visible change in affect and less participation on the unit  06/26/15: Pt rates depression high today;  passive SI off and on  3.  Goal(s): Patient will demonstrate decreased signs and symptoms of anxiety.  Met:  Goal progressing  Target date: 3-5 days from date of admission   As evidenced by: Patient will utilize self rating of anxiety at 3 or below and demonstrated decreased signs of anxiety, or be deemed stable for discharge by MD  06/14/15: Pt rates anxiety at 7/10.  06/19/15: Pt rates anxiety at 5/10.  06/22/15: Pt becoming more anxious on the unit, reporting high anxiety.  06/26/15: Pt continues to be more anxious on the unit; rates anxiety highly.  Attendees:  Patient:    Family:    Physician: Dr. Parke Poisson, MD  06/26/2015 12:09 PM  Nursing: Lars Pinks, RN Case manager  06/26/2015 12:09 PM  Clinical Social Worker Norman Clay, MSW 06/26/2015 12:09 PM  Other: Lucinda Dell, Beverly Sessions Liasion 06/26/2015 12:09 PM  Clinical: Loletta Specter, RN 06/26/2015 12:09 PM  Other: , RN Charge Nurse 06/26/2015 12:09 PM  Other:     Peri Maris, Latanya Presser MSW

## 2015-06-26 NOTE — Progress Notes (Addendum)
Patient continues to rest in bed, refusing to get up for AM meds. "I take them for lunch. I just don't want to get up." Eye contact brief, affect depressed. Explained to patient he needs his AM neurontin dose as it cannot be given with the noon dose. Also pointed out sleeping this late most likely is causing his insomnia. Acknowledged his depression and encouraged him to push himself to be out in the milieu, attend programming. Rating depression and anxiety at a 9/10, hopelessness at an 8/10. Patient minimally receptive. When asked about unsafe thoughts, patient responded, "I don't know. I haven't been awake enough to think about it." He assures this Clinical research associatewriter of his verbal contract to remain safe in his actions. States his goal is to "be positive and stay encouraged." Will continue to monitor closely. Russell [name], Russell [name]

## 2015-06-26 NOTE — [facility] Group Notes (Signed)
[facility] Group Notes:  (Nursing/MHT/Case Management/Adjunct)  Date:  06/26/2015  Time:  0900  Type of Therapy:  Nurse Education - SMART Method of Goal Setting to Promote Recover  Participation Level:  Did Not Attend  Participation Quality:    Affect:    Cognitive:    Insight:    Engagement in Group:    Modes of Intervention:    Summary of Progress/Problems: Patient invited however remained in bed.  Merian CapronFriedman, Klover Priestly [facility] 06/26/2015, 9:35 AM

## 2015-06-26 NOTE — Plan of Care (Signed)
Problem: Alteration in mood; excessive anxiety as evidenced by: Goal: STG-Pt will report an absence of self-harm thoughts/actions (Patient will report an absence of self-harm thoughts or actions)  Outcome: Not Progressing Patient continues to endorse passive SI on and off. Verbally contracts for safety.  Problem: Diagnosis: Increased Risk For Suicide Attempt Goal: STG-Patient Will Attend All Groups On The Unit Outcome: Not Progressing Patient only attends approximately half of groups on unit. Participation is generally minimal.

## 2015-06-27 MED ORDER — GABAPENTIN 400 MG PO CAPS
400.0000 mg | ORAL_CAPSULE | Freq: Three times a day (TID) | ORAL | Status: DC
  Administered 2015-06-27 – 2015-06-30 (×7): 400 mg via ORAL
  Filled 2015-06-27: qty 1
  Filled 2015-06-27: qty 42
  Filled 2015-06-27: qty 1
  Filled 2015-06-27: qty 42
  Filled 2015-06-27 (×2): qty 1
  Filled 2015-06-27: qty 42
  Filled 2015-06-27 (×8): qty 1

## 2015-06-27 NOTE — Clinical Social Work Note (Signed)
[facility]  Santa GeneraAnne Cunningham, LCSW Lead Clinical Social Worker Phone:  [phone]

## 2015-06-27 NOTE — Progress Notes (Signed)
Patient's status remains fairly unchanged. While he reports slightly improved sleep last night, he again stayed in bed until lunch. Continues to state none of the coping skills his been taught work for him as his wife is his Associate Professorcoping skill. Denies pain, physical problems. No requests for prn's thus far today. Patient medicated per orders. Support offered. Again encouraged to regulate sleep, attend groups but patient minimally receptive. Denies SI "right now" and verbally contracts for safety. No HI/AVH. Lawrence MarseillesFriedman, Taten Merrow Eakes

## 2015-06-27 NOTE — Progress Notes (Signed)
Adult Psychoeducational Group Note  Date:  06/27/2015 Time:  9:36 PM  Group Topic/Focus:  Wrap-Up Group:   The focus of this group is to help patients review their daily goal of treatment and discuss progress on daily workbooks.  Participation Level:  Active  Participation Quality:  Appropriate and Attentive  Affect:  Appropriate  Cognitive:  Appropriate  Insight: Appropriate  Engagement in Group:  Engaged  Modes of Intervention:  Discussion  Additional Comments:  Pt stated his day was better than yesterday. Pt stated he wanted to work on "getting out of here," even though he did not have a goal for today. Pt stated one positive thing is that he was able to talk to his daughter today.  Russell CorwinOwen, Russell [name] 06/27/2015, 9:36 PM

## 2015-06-27 NOTE — [facility] Group Notes (Signed)
[facility]  06/27/2015 8:45 AM  Pt did not attend, declined invitation.   Chad CordialLauren Carter, LCSWA 06/27/2015 9:26 AM

## 2015-06-27 NOTE — Progress Notes (Signed)
Patient ID: Russell [name], male   DOB: Dec 04, 1970, 44 y.o.   MRN: [id number]  [facility] MD Progress Note  06/27/2015 4:43 PM Russell [name]  MRN:  [id number]    Subjective:  Patient still depressed, sad, ruminative about his marital stressors, reports he tried to call her to set up a family meeting but " she just hung up on me ". At this time , however, reports a slight degree of improvement - for example, is more future oriented, and states he has  decided to " not call her for a few days, let her be, it looks like she may need some time off from my depression and my problems", and is thinking of other options of where he could go live after discharge. In particular, thinks he can live with his father for a period of time . Patient states that yesterday he " thought about all this stuff I got myself into a panic and I felt I had to vomit". Today denies nausea, vomiting. Reports partially improved sleep on Remeron. Denies medication side effects.  Objective :  I have met with patient and have discussed case with treatment team. Still significantly depressed , but today not tearful, not as withdrawn, out of bed more, more visible on unit, and going to some  Groups. As noted, more future oriented, and less intensely ruminative about marital discord issues. Denies medication side effects. No seizures /seizure like activities on unit. No disruptive or agitated behaviors on unit.  Principal Problem: Bipolar I disorder, most recent episode depressed ([place]) Diagnosis:   Patient Active Problem List   Diagnosis Date Noted  . Depression [F32.9] 06/13/2015  . Bipolar I disorder, most recent episode depressed ([place]) [F31.30] 06/13/2015  . 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 Severe major depression without psychotic features ([place]) [F32.2] 12/23/2014  . Alcohol use disorder, severe, dependence ([place]) [F10.20] 12/23/2014  . Tobacco use disorder  [F17.200] 12/23/2014  . Seizure disorder ([place]) [K35.465] 12/23/2014  . Stimulant use disorder ([place]) [F15.90]   . Opioid use disorder, severe, dependence ([place]) [F11.20]   . Self-inflicted laceration of wrist [S61.519A]    Total Time spent with patient: 25 minutes     Past Medical History:  Past Medical History  Diagnosis Date  . Hernia   . Bipolar 1 disorder ([place])   . Schizophrenia ([place])   . Depression   . PONV (postoperative nausea and vomiting)     Over twenty years ago.  Marland Kitchen Anxiety   . Epilepsy ([place])     last sz 03/09/15    Past Surgical History  Procedure Laterality Date  . Ankle fracture surgery  1994 - approximate  . Ligament repair  1998    right arm/wrist   Family History:  Family History  Problem Relation Age of Onset  . Diabetes Father   . Alcoholism Brother   . Cancer Maternal Grandmother   . Cancer Paternal Grandfather    Social History:  History  Alcohol Use  . 0.0 oz/week  . 0 Standard drinks or equivalent per week    Comment: Drinks: 2-3 month. Last drink: Saturday     History  Drug Use No    Social History   Social History  . Marital Status: Single    Spouse Name: [name]  . Number of Children: 2  . Years of Education: 7   Occupational History  .      [employer]   Social History Main  Topics  . Smoking status: Former Smoker -- 0.50 packs/day    Types: Cigarettes    Quit date: 03/22/2000  . Smokeless tobacco: Never Used  . Alcohol Use: 0.0 oz/week    0 Standard drinks or equivalent per week     Comment: Drinks: 2-3 month. Last drink: Saturday  . Drug Use: No  . Sexual Activity: Not Asked   Other Topics Concern  . None   Social History Narrative   Lives at home with wife, child   No caffeine use   Additional Social History:   Sleep:   Fair   Appetite:  Good   Current Medications: Current Facility-Administered Medications  Medication Dose Route Frequency Provider Last Rate Last Dose  . acetaminophen (TYLENOL) tablet  650 mg  650 mg Oral Q6H PRN Kerrie Buffalo, NP   650 mg at 06/13/15 2117  . alum & mag hydroxide-simeth (MAALOX/MYLANTA) 200-200-20 MG/5ML suspension 30 mL  30 mL Oral Q4H PRN Kerrie Buffalo, NP   30 mL at 06/27/15 1439  . divalproex (DEPAKOTE ER) 24 hr tablet 1,500 mg  1,500 mg Oral q morning - 10a Myer Peer Cobos, MD   1,500 mg at 06/27/15 1210  . gabapentin (NEURONTIN) capsule 400 mg  400 mg Oral TID Jenne Campus, MD      . hydrOXYzine (ATARAX/VISTARIL) tablet 25 mg  25 mg Oral Q6H PRN Harriet [place]te, NP   25 mg at 06/26/15 1300  . hydrOXYzine (ATARAX/VISTARIL) tablet 50 mg  50 mg Oral QHS,MR X 1 Laverle Hobby, PA-C   50 mg at 06/26/15 2334  . LORazepam (ATIVAN) tablet 0.5 mg  0.5 mg Oral Q6H PRN Jenne Campus, MD   0.5 mg at 06/26/15 2255  . magnesium hydroxide (MILK OF MAGNESIA) suspension 30 mL  30 mL Oral Daily PRN Kerrie Buffalo, NP      . mirtazapine (REMERON) tablet 15 mg  15 mg Oral QHS Jenne Campus, MD   15 mg at 06/26/15 2255  . nicotine (NICODERM CQ - dosed in mg/24 hours) patch 21 mg  21 mg Transdermal Daily Jenne Campus, MD   21 mg at 06/27/15 1211  . pantoprazole (PROTONIX) EC tablet 80 mg  80 mg Oral Daily Kerrie Buffalo, NP   80 mg at 06/27/15 1210  . pneumococcal 23 valent vaccine (PNU-IMMUNE) injection 0.5 mL  0.5 mL Intramuscular Tomorrow-1000 Fernando A Cobos, MD   0.5 mL at 06/16/15 0800  . sertraline (ZOLOFT) tablet 200 mg  200 mg Oral Daily Jenne Campus, MD   200 mg at 06/27/15 1210    Lab Results:  No results found for this or any previous visit (from the past 48 hour(s)).  Physical Findings: AIMS: Facial and Oral Movements Muscles of Facial Expression: None, normal Lips and Perioral Area: None, normal Jaw: None, normal Tongue: None, normal,Extremity Movements Upper (arms, wrists, hands, fingers): None, normal Lower (legs, knees, ankles, toes): None, normal, Trunk Movements Neck, shoulders, hips: None, normal, Overall Severity Severity of  abnormal movements (highest score from questions above): None, normal Incapacitation due to abnormal movements: None, normal Patient's awareness of abnormal movements (rate only patient's report): No Awareness, Dental Status Current problems with teeth and/or dentures?: No Does patient usually wear dentures?: No  CIWA:  CIWA-Ar Total: 4 COWS:  COWS Total Score: 0  Musculoskeletal: Strength & Muscle Tone: within normal limits Gait & Station: normal Patient leans: N/A  Psychiatric Specialty Exam: Review of Systems  Gastrointestinal: Positive for abdominal  pain. Negative for nausea, vomiting, diarrhea and constipation.  Psychiatric/Behavioral: Negative for depression, suicidal ideas and hallucinations. The patient has insomnia. The patient is not nervous/anxious.   All other systems reviewed and are negative.    Blood pressure 121/81, pulse 79, temperature 97.6 F (36.4 C), temperature source Oral, resp. rate 16, height 6' 1"  (1.854 m), weight 203 lb 14.8 oz (92.5 kg).Body mass index is 26.91 kg/(m^2).  General Appearance: improved grooming  Eye Contact::  Good   Speech:  Normal Rate  Volume:  Normal  Mood:    Depressed , but slight improvement in severity of depression noted today  Affect:    Constricted , but somewhat more reactive affect today  Thought Process:  Linear  Orientation:  Full (Time, Place, and Person)  Thought Content:   No hallucinations, no delusions , not internally preoccupied   Suicidal Thoughts:  Denies  Suicidal ideations at this time and  contracts for safety on the unit.    Homicidal Thoughts:  No denies any violent ideations towards anyone and specifically also denies any thoughts of wanting to hurt wife   Memory:  recent and remote grossly intact   Judgement:  Other:  improving   Insight:  improving   Psychomotor Activity:   Improving partially, out of bed more, more visible on unit   Concentration:  Good  Recall:  Good  Fund of Knowledge:Good  Language:  Good  Akathisia:  Negative  Handed:  Right  AIMS (if indicated):     Assets:  Communication Skills Desire for Improvement Resilience  ADL's:  Intact  Cognition: WNL  Sleep:  Number of Hours: 4.25  Assessment -   Patient  Remains significantly depressed, sad, but today no longer tearful , and more future oriented, thinking of possible alternatives to returning home after discharge such as going to live with his father for a time . Feels he slept better on Remeron.  Still ruminative about marital discord, but to a less intense degree than yesterday. Reports ongoing anxiety, at times severe.   Partially improved on Remeron trial. Denies medication side effects.  Treatment Plan Summary: Daily contact with patient to assess and evaluate symptoms and progress in treatment, Medication management, Plan inpatient admission and medications as below   Encourage ongoing milieu participation to work on coping skills and symptom reduction Continue Depakote ER 1500  mgrs QDAY for history of mood disorder and history of seizure disorder  Continue    Zoloft 200  mgrs QDAY for management of depression and anxiety Continue Ativan 0.5 mgrs Q 6 hours PRN anxiety or insomnia .  Increase  Neurontin   To 400   mgrs TID initially to address ongoing  Anxiety ContinueRemeron 15 mgrs QHS for depression and for insomnia.  Patient states wife not interested in participating in a family meeting at this time Treatment team working on disposition options     Neita Garnet,  MD  06/27/2015, 4:43 PM

## 2015-06-27 NOTE — Progress Notes (Signed)
Recreation Therapy Notes  Date: 06/27/15 Time: 930 Location: 300 Hall Dayroom  Group Topic: Stress Management  Goal Area(s) Addresses:  Patient will verbalize importance of using healthy stress management.  Patient will identify positive emotions associated with healthy stress management.   Intervention: Stress Management  Activity :  Progressive Muscle Relaxation.  LRT introduced and educated patients on stress management technique of progressive muscle relaxation.  A script was used to deliver the technique to patients and patients were asked to follow script read allowed by LRT to engage in practicing the stress management technique.    Education:  Stress Management, Discharge Planning.   Education Outcome: Acknowledges edcuation/In group clarification offered/Needs additional education  Clinical Observations/Feedback:  Patient did not attend group.    Jeevan Kalla, LRT/CTRS         Susannah Carbin A 06/27/2015 4:07 PM 

## 2015-06-27 NOTE — [facility] Group Notes (Signed)
[facility] LCSW Group Therapy 06/27/2015 1:15 PM  Type of Therapy: Group Therapy- Emotion Regulation  Participation Level: Active   Participation Quality:  Appropriate  Affect: Appropriate  Cognitive: Alert and Oriented   Insight:  Developing/Improving  Engagement in Therapy: Developing/Improving and Engaged   Modes of Intervention: Clarification, Confrontation, Discussion, Education, Exploration, Limit-setting, Orientation, Problem-solving, Rapport Building, Dance movement psychotherapisteality Testing, Socialization and Support  Summary of Progress/Problems: The topic for group today was emotional regulation. This group focused on both positive and negative emotion identification and allowed group members to process ways to identify feelings, regulate negative emotions, and find healthy ways to manage internal/external emotions. Group members were asked to reflect on a time when their reaction to an emotion led to a negative outcome and explored how alternative responses using emotion regulation would have benefited them. Group members were also asked to discuss a time when emotion regulation was utilized when a negative emotion was experienced. Pt active in group participation today and was willing to process emotions. Pt continues to have difficulty identifying new coping mechanisms and necessary changes in his life in order to have a successful recovery journey. Pt is able to identify that he has had an inability to effectively regulate his emotions regardless of their source. Pt reports that he struggling with the feeling of abandonment at this time.   Russell CordialLauren Carter, LCSWA 06/27/2015 2:51 PM

## 2015-06-27 NOTE — Plan of Care (Signed)
Problem: Ineffective individual coping Goal: STG:Pt. will utilize relaxation techniques to reduce stress STG: Patient will utilize relaxation techniques to reduce stress levels  Outcome: Not Progressing Again suggested to patient that he employ coping skill such as deep breathing, progressive relaxation, distraction, etc. Patient states, "that stuff doesn't work for me. My wife is my coping skill. Well, I guess she was."  Problem: Diagnosis: Increased Risk For Suicide Attempt Goal: STG-Patient Will Report Suicidal Feelings to Staff Outcome: Progressing Patient denying SI "as of now, but it gets bad a night time." Verbally contracts for safety should he develop SI.

## 2015-06-28 MED ORDER — PRAZOSIN HCL 1 MG PO CAPS
1.0000 mg | ORAL_CAPSULE | Freq: Every day | ORAL | Status: DC
  Filled 2015-06-28 (×2): qty 1

## 2015-06-28 NOTE — Progress Notes (Signed)
Pt attended the evening karaoke group. Pt was appropriate and engaged. 

## 2015-06-28 NOTE — Progress Notes (Signed)
D: Russell [name] is still considering ECT treatment for his depression. He rates anxiety 6/10 Depression 6-7/10. Denies SI/HI/AVH. He states his wife called him this evening and the conversation was slightly better than the previous night however, she is still distant and sarcastic during most of conversation. He was able to speak with his 44 year old daughter in great length tonight and he was happy about that.  A: I encouraged Russell [name] to focus on the positive aspect of his phone conversation tonight which was the length of time he was able to speak with his daughter. Positive support given.  R: Continue to monitor for patient safety and medication effectiveness.

## 2015-06-28 NOTE — [facility] Group Notes (Signed)
[facility] LCSW Group Therapy 06/28/2015 1:15pm  Type of Therapy: Group Therapy- Balance in Life  Participation Level: Active   Description of the Group:  The topic for group was balance in life. Today's group focused on defining balance in one's own words, identifying things that can knock one off balance, and exploring healthy ways to maintain balance in life. Group members were asked to provide an example of a time when they felt off balance, describe how they handled that situation,and process healthier ways to regain balance in the future. Group members were asked to share the most important tool for maintaining balance that they learned [facility] and how they plan to apply this method after discharge.  Summary of Patient Progress Pt continues to identify a feeling of belonging and family support as what he needs for recovery. He expressed that he feels that balance is a mix of "good and bad" circumstances. He inquired about good coping skills to use for anxiety and depression. Pt continues to have difficulty articulating changes that he needs to make in order to better his situation and mental health.    Therapeutic Modalities:   Cognitive Behavioral Therapy Solution-Focused Therapy Assertiveness Training   Chad CordialLauren Carter, LCSWA 06/28/2015 4:04 PM

## 2015-06-28 NOTE — Progress Notes (Signed)
Nutrition Education Note  Pt attended group focusing on general, healthful nutrition education.  RD emphasized the importance of eating regular meals and snacks throughout the day. Consuming sugar-free beverages and incorporating fruits and vegetables into diet when possible. Provided examples of healthy snacks. Patient encouraged to leave group with a goal to improve nutrition/healthy eating.   Diet Order: Diet regular Room service appropriate?: Yes; Fluid consistency:: Thin Pt is also offered choice of unit snacks mid-morning and mid-afternoon.  Pt is eating as desired.   If additional nutrition issues arise, please consult RD.     Wilburt Messina, RD, LDN Inpatient Clinical Dietitian Pager # [phone] After hours/weekend pager # [phone]     

## 2015-06-28 NOTE — Progress Notes (Addendum)
D: Russell [name]. Another goal was for him to speak with his daughter and lastly to attend groups. He was able to meet all three goals. I asked Russell [name] if he could name one coping mechanism or skill he is going to use once he is discharged. He states he is going to help his elderly father by running errands, doing yard work, cleaning up around his home and getting groceries to decrease his anxiety. Spending time with his father will be a good distraction for him. He rates Anxiety 3/10 Depression 4/10. A: Encouragement and support given.  R: Continue to monitor for patient safety and medication effectiveness.

## 2015-06-28 NOTE — [facility] Group Notes (Signed)
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
## 2015-06-28 NOTE — Progress Notes (Signed)
D [name] is status quo...meaning he is quiet, sticks to the back of the line at the med window and does not talk much , at first, this morning when he takes his morning meds. HE looks well rested. HE agrees with this nurse when she says he appears well -rested . HE responds ' I think I slept better last night than any other night".    A he takes his scheduled meds and he goes to his afternoon groups, saying " I can't get up in the morning. " I can't do it".    R Safety is in place and pt and staff cont to work on [facility] planning. Pt reports he will live with father at [place].Marland Kitchen. For short term time .

## 2015-06-28 NOTE — Tx Team (Signed)
Interdisciplinary Treatment Plan Update (Adult) Date: 06/28/2015   Date: 06/28/2015 3:04 PM  Progress in Treatment:  Attending groups: Yes  Participating in groups: Yes  Taking medication as prescribed: Yes  Tolerating medication: Yes  Family/Significant othe contact made: No, Pt declines family contact Patient understands diagnosis: Yes Discussing patient identified problems/goals with staff: Yes  Medical problems stabilized or resolved: Yes  Denies suicidal/homicidal ideation: Yes Patient has not harmed self or Others: Yes   New problem(s) identified: None identified at this time.   Discharge Plan or Barriers: Pt will return home and follow-up with outpatient resources  Additional comments:  MD is suggesting referral for ECT; MD requests family session with wife 06/28/15: Wife has declined family session; Pt improving so ECT referral no longer needed.  Reason for Continuation of Hospitalization:  Anxiety Depression Medication stabilization Suicidal ideation  Estimated length of stay: 1-2 days  Review of initial/current patient goals per problem list:   1.  Goal(s): Patient will participate in aftercare plan  Met:  Yes  Target date: 3-5 days from date of admission   As evidenced by: Patient will participate within aftercare plan AEB aftercare provider and housing plan at discharge being identified.   06/14/15: Pt will return home and follow-up with outpatient resources  06/28/15: Pt planning to stay with his dad and follow-up with [facility] and TCT  2.  Goal (s): Patient will exhibit decreased depressive symptoms and suicidal ideations.  Met:  goal progressing  Target date: 3-5 days from date of admission   As evidenced by: Patient will utilize self rating of depression at 3 or below and demonstrate decreased signs of depression or be deemed stable for discharge by MD.  06/14/15: Pt rates depression at 6/10; denies SI  06/19/15: Pt rates depression at 4/10;  denies SI  06/22/15: Pt rates depression at higher rates; visible change in affect and less participation on the unit  06/26/15: Pt rates depression high today; passive SI off and on  06/28/15: Pt rates depression at 6-7/10; denies SI  3.  Goal(s): Patient will demonstrate decreased signs and symptoms of anxiety.  Met:  Goal progressing  Target date: 3-5 days from date of admission   As evidenced by: Patient will utilize self rating of anxiety at 3 or below and demonstrated decreased signs of anxiety, or be deemed stable for discharge by MD  06/14/15: Pt rates anxiety at 7/10.  06/19/15: Pt rates anxiety at 5/10.  06/22/15: Pt becoming more anxious on the unit, reporting high anxiety.  06/26/15: Pt continues to be more anxious on the unit; rates anxiety highly.  06/28/15: Pt rates anxiety at 6/10; affect is improving and he is more visible in the milieu  Attendees:  Patient:    Family:    Physician: Dr. Parke Poisson, MD  06/28/2015 3:04 PM  Nursing: Lars Pinks, RN Case manager  06/28/2015 3:04 PM  Clinical Social Worker Norman Clay, MSW 06/28/2015 3:04 PM  Other: Jake Bathe Liasion 06/28/2015 3:04 PM  Clinical: Sandre Kitty, RN 06/28/2015 3:04 PM  Other: , RN Charge Nurse 06/28/2015 3:04 PM  Other:     Peri Maris, Latanya Presser MSW

## 2015-06-29 MED ORDER — GABAPENTIN 400 MG PO CAPS: 400.0000 mg | ORAL_CAPSULE | Freq: Three times a day (TID) | ORAL | Status: DC

## 2015-06-29 MED ORDER — MIRTAZAPINE 15 MG PO TABS: 15.0000 mg | ORAL_TABLET | Freq: Every day | ORAL | Status: DC

## 2015-06-29 MED ORDER — SERTRALINE HCL 100 MG PO TABS: 200.0000 mg | ORAL_TABLET | Freq: Every day | ORAL | Status: DC

## 2015-06-29 MED ORDER — PANTOPRAZOLE SODIUM 40 MG PO TBEC: 80.0000 mg | DELAYED_RELEASE_TABLET | Freq: Every day | ORAL | Status: DC

## 2015-06-29 MED ORDER — DIVALPROEX SODIUM ER 500 MG PO TB24: 1500.0000 mg | ORAL_TABLET | Freq: Every morning | ORAL | Status: DC

## 2015-06-29 NOTE — Tx Team (Signed)
Interdisciplinary Treatment Plan Update (Adult) Date: 06/29/2015   Date: 06/29/2015 11:17 AM  Progress in Treatment:  Attending groups: Yes  Participating in groups: Yes  Taking medication as prescribed: Yes  Tolerating medication: Yes  Family/Significant othe contact made: No, Pt declines family contact Patient understands diagnosis: Yes Discussing patient identified problems/goals with staff: Yes  Medical problems stabilized or resolved: Yes  Denies suicidal/homicidal ideation: Yes Patient has not harmed self or Others: Yes   New problem(s) identified: None identified at this time.   Discharge Plan or Barriers: Pt will return home and follow-up with outpatient resources  Additional comments:  MD is suggesting referral for ECT; MD requests family session with wife 06/28/15: Wife has declined family session; Pt improving so ECT referral no longer needed.  Reason for Continuation of Hospitalization:  Anxiety Depression Medication stabilization Suicidal ideation  Estimated length of stay: 0 days; Pt stable for DC today  Review of initial/current patient goals per problem list:   1.  Goal(s): Patient will participate in aftercare plan  Met:  Yes  Target date: 3-5 days from date of admission   As evidenced by: Patient will participate within aftercare plan AEB aftercare provider and housing plan at discharge being identified.   06/14/15: Pt will return home and follow-up with outpatient resources  06/28/15: Pt planning to stay with his dad and follow-up with [facility] and TCT  2.  Goal (s): Patient will exhibit decreased depressive symptoms and suicidal ideations.  Met:  Adequate for DC  Target date: 3-5 days from date of admission   As evidenced by: Patient will utilize self rating of depression at 3 or below and demonstrate decreased signs of depression or be deemed stable for discharge by MD.  06/14/15: Pt rates depression at 6/10; denies SI  06/19/15: Pt rates  depression at 4/10; denies SI  06/22/15: Pt rates depression at higher rates; visible change in affect and less participation on the   unit  06/26/15: Pt rates depression high today; passive SI off and on  06/28/15: Pt rates depression at 6-7/10; denies SI  06/29/15: Pt reports depression is decreasing and continues to deny SI; MD feels that Pt's  symptoms have decreased to the point that they can be managed in an outpatient setting.  3.  Goal(s): Patient will demonstrate decreased signs and symptoms of anxiety.  Met:  Yes  Target date: 3-5 days from date of admission   As evidenced by: Patient will utilize self rating of anxiety at 3 or below and demonstrated decreased signs of anxiety, or be deemed stable for discharge by MD  06/14/15: Pt rates anxiety at 7/10.  06/19/15: Pt rates anxiety at 5/10.  06/22/15: Pt becoming more anxious on the unit, reporting high anxiety.  06/26/15: Pt continues to be more anxious on the unit; rates anxiety highly.  06/28/15: Pt rates anxiety at 6/10; affect is improving and he is more visible in the milieu  06/29/15: Pt reports that his anxiety is decreasing. MD feels that Pt's symptoms have decreased to the point that they can be managed in an outpatient setting.  Attendees:  Patient:    Family:    Physician: Dr. Parke Poisson, MD  06/29/2015 11:17 AM  Nursing: Lars Pinks, RN Case manager  06/29/2015 11:17 AM  Clinical Social Worker Norman Clay, MSW 06/29/2015 11:17 AM  Other: Jake Bathe Liasion 06/29/2015 11:17 AM  Clinical: Sandre Kitty, RN 06/29/2015 11:17 AM  Other: , RN Charge Nurse 06/29/2015 11:17 AM  Other:  Peri Maris, Latanya Presser MSW

## 2015-06-29 NOTE — [facility] Suicide Risk Assessment (Signed)
[facility]   Demographic Factors:  44 year old married male, was living with wife and children, but at this time plans to go live with father  Total Time spent with patient: 30 minutes  Musculoskeletal: Strength & Muscle Tone: within normal limits Gait & Station: normal Patient leans: N/A  Psychiatric Specialty Exam: Physical Exam  ROS  Blood pressure 120/75, pulse 75, temperature 97.6 F (36.4 C), temperature source Oral, resp. rate 18, height 6\' 1"  (1.854 m), weight 203 lb 14.8 oz (92.5 kg).Body mass index is 26.91 kg/(m^2).  General Appearance: Well Groomed  Eye Contact::  Good  Speech:  Normal Rate409  Volume:  Normal  Mood:  improved mood , less depressed   Affect:  Appropriate and more reactive, more hopeful, less constricted  Thought Process:  Goal Directed and Linear  Orientation:  Full (Time, Place, and Person)  Thought Content:  denies hallucinations, no delusions, less ruminative about marital stressors   Suicidal Thoughts:  No at this time denies any suicidal ideations or any thoughts of hurting self -he also denies any homicidal ideations or any thoughts of hurting anyone- specifically also denies any thoughts of violence towards wife   Homicidal Thoughts:  No  Memory:  recent and remote grossly intact   Judgement:  Other:  improved   Insight:  improved   Psychomotor Activity:  Normal and no longer psychomotorically retarded   Concentration:  Good  Recall:  Good  Fund of Knowledge:Good  Language: Good  Akathisia:  Negative  Handed:  Right  AIMS (if indicated):     Assets:  Desire for Improvement Resilience Social Support  Sleep:  Number of Hours: 5.25  Cognition: WNL  ADL's:  Intact   Have you used any form of tobacco in the last 30 days? (Cigarettes, Smokeless Tobacco, Cigars, and/or Pipes): No  Has this patient used any form of tobacco in the last 30 days? (Cigarettes, Smokeless Tobacco, Cigars, and/or Pipes) No  Mental  Status Per Nursing Assessment::   On Admission:  Suicidal ideation indicated by patient, Self-harm thoughts  Current Mental Status by Physician: At this time patient is improved compared to presentation earlier this week. Mood is now improved, affect brighter, he reports feeling more hopeful , more future oriented, no thought disorder, no SI or HI, no psychotic symptoms,  Future oriented .  Of note, at this time denies any medication side effects.  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 Is alert , attentive, and oriented x 3   Suicide Risk:  Mild:  Suicidal ideation of limited frequency, intensity, duration, and specificity.  There are no identifiable plans, no associated intent, mild dysphoria and related symptoms, good self-control (both objective and subjective assessment), few other risk factors, and identifiable  protective factors, including available and accessible social support.  Principal Problem: Bipolar I disorder, most recent episode depressed [facility]) Discharge Diagnoses:  Patient Active Problem List   Diagnosis Date Noted  . Depression [F32.9] 06/13/2015  . Bipolar I disorder, most recent episode depressed  ([facility]) [F31.30] 06/13/2015  . Sedative, hypnotic or anxiolytic use disorder, severe, dependence ([facility]) [F13.20] 12/25/2014  . 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 Alcohol use disorder, severe, dependence ([facility]) [F10.20] 12/23/2014  . Tobacco use disorder [F17.200] 12/23/2014  . Seizure disorder ([facility]) [G40.909] 12/23/2014  . 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 Opioid use disorder, severe, dependence ([facility]) [F11.20]   . Self-inflicted laceration of wrist [S61.519A]     Follow-up Information    Follow up with [facility].   Specialty:  Behavioral Health   Why:  TCT member will contact you with appointment date and time.    Contact information:   [address] [place] [address] [phone]       Plan Of Care/Follow-up recommendations:  Activity:  as tolerated  Diet:  regular Tests:  NA Other:  see below  Is patient on multiple antipsychotic therapies at discharge:  No   Has Patient had three or more failed trials of antipsychotic monotherapy by history:  No  Recommended Plan for Multiple Antipsychotic Therapies: NA  Patient is leaving unit in good spirits, feeling ready for discharge, plans to live with his father. Plans to follow up as above. Will also be connected with TCT for further support  Plans to follow up with [facility] for management of Seizure Disorder    Dajahnae Vondra 06/29/2015, 11:41 AM

## 2015-06-29 NOTE — Progress Notes (Signed)
Russell [name] is status quo today. HE got OOB around 1130, took his morning meds and then went to lunch with the other patients on the unit. HE is cooperative. He requests ( as soon as he got up) " something to help me feel better" and this nurse asked him, " what?" and he responded " vistaril and ativan".   A He is medicated for his anxiety per his request. He says he plans to be discharged and his father is planning on picking him up later this afternoon. Shortly after 1300 today, the pt's SW told this nurse pt would NOT be  Going home due to  pt not being able to stay with his father initially. Pt is sad , quiet and his face is hanging down on his chest.    R DC planning to cont and safety maintained.

## 2015-06-29 NOTE — Progress Notes (Signed)
Recreation Therapy Notes  Date: 06/29/15 Time: 930 Location: 300 Group Room  Group Topic: Stress Management  Goal Area(s) Addresses:  Patient will verbalize importance of using healthy stress management.  Patient will identify positive emotions associated with healthy stress management.   Intervention: Stress Management  Activity :  Guided Imagery.  LRT introduced the concept of guided imagery to the patients.  Patients were asked to follow along as LRT read the script to engage in the activity of guided imagery.  Education:  Stress Management, Discharge Planning.   Education Outcome: Acknowledges edcuation/In group clarification offered/Needs additional education  Clinical Observations/Feedback: Patient did not attend group.   Caroll RancherMarjette Zaine Elsass, LRT/CTRS         Caroll RancherLindsay, Eiliyah Reh A 06/29/2015 1:43 PM

## 2015-06-29 NOTE — Progress Notes (Signed)
Patient ID: Russell [name], male   DOB: 1971-02-05, 44 y.o.   MRN: [id number]  [facility] MD Progress Note  06/29/2015 2:02 PM Russell [name]  MRN:  [id number]    Subjective:   Patient reports ongoing gradual improvement of mood and affect. He remains depressed, but to lesser degree , and denies any SI at this time.  Denies medication side effects .  Objective :  I have met with patient and have discussed case with treatment team. Mood , which had been significantly depressed, has continued to partially but significantly improve over the last 2-3 days. Patient has presented with improving range of affect, overall mood, and decreased avoidance /isolation. He has started going to groups again, and has become more future oriented and focused on discharge  His behavior has remained calm, appropriate, not agitated or disruptive. He has denied medication side effects and has reported that he is sleeping better and feeling better after recent medication changes ( we D/C d Zyprexa and added Remeron to his treatment regimen).   Of note, patient had been seen earlier today as well with CSW and had agreed to D/C, due to overall improvement. His plan was to go live with his father for the time being , as he cannot return home at this time due to marital discord/ conflict . He found out a little while ago, however, that his father is NOT going to allow him to come live with him as he was expecting . Patient states the only other housing option he has is a friend, whom he has called with no return answer . This issue discussed with CSW , staff, patient. Patient is expressing interest in going to a shelter, but as per CSW due to time of day, holiday finding shelter locally at this time is significant challenge. Patient is expressing interest and agreement in going to [facility], and CSW is working on this option at this time  Principal Problem: Bipolar I disorder, most recent episode depressed  ([place]) Diagnosis:   Patient Active Problem List   Diagnosis Date Noted  . Depression [F32.9] 06/13/2015  . Bipolar I disorder, most recent episode depressed ([place]) [F31.30] 06/13/2015  . Sedative, hypnotic or anxiolytic use disorder, severe, dependence ([place]) [F13.20] 12/25/2014  . Severe major depression without psychotic features ([place]) [F32.2] 12/23/2014  . Alcohol use disorder, severe, dependence ([place]) [F10.20] 12/23/2014  . Tobacco use disorder [F17.200] 12/23/2014  . Seizure disorder ([place]) [V49.449] 12/23/2014  . Stimulant use disorder ([place]) [F15.90]   . Opioid use disorder, severe, dependence ([place]) [F11.20]   . Self-inflicted laceration of wrist [S61.519A]    Total Time spent with patient: 25 minutes     Past Medical History:  Past Medical History  Diagnosis Date  . Hernia   . Bipolar 1 disorder ([place])   . Schizophrenia ([place])   . Depression   . PONV (postoperative nausea and vomiting)     Over twenty years ago.  Marland Kitchen Anxiety   . Epilepsy ([place])     last sz 03/09/15    Past Surgical History  Procedure Laterality Date  . Ankle fracture surgery  1994 - approximate  . Ligament repair  1998    right arm/wrist   Family History:  Family History  Problem Relation Age of Onset  . Diabetes Father   . Alcoholism Brother   . Cancer Maternal Grandmother   . Cancer Paternal Grandfather    Social History:  History  Alcohol Use  . 0.0 oz/week  .  0 Standard drinks or equivalent per week    Comment: Drinks: 2-3 month. Last drink: Saturday     History  Drug Use No    Social History   Social History  . Marital Status: Single    Spouse Name: [name]  . Number of Children: 2  . Years of Education: 7   Occupational History  .      [employer]   Social History Main Topics  . Smoking status: Former Smoker -- 0.50 packs/day    Types: Cigarettes    Quit date: 03/22/2000  . Smokeless tobacco: Never Used  . Alcohol Use: 0.0 oz/week    0 Standard drinks or equivalent per  week     Comment: Drinks: 2-3 month. Last drink: Saturday  . Drug Use: No  . Sexual Activity: Not Asked   Other Topics Concern  . None   Social History Narrative   Lives at home with wife, child   No caffeine use   Additional Social History:   Sleep:   Improved, and states he slept better last night   Appetite:  Good   Current Medications: Current Facility-Administered Medications  Medication Dose Route Frequency Provider Last Rate Last Dose  . acetaminophen (TYLENOL) tablet 650 mg  650 mg Oral Q6H PRN Kerrie Buffalo, NP   650 mg at 06/13/15 2117  . alum & mag hydroxide-simeth (MAALOX/MYLANTA) 200-200-20 MG/5ML suspension 30 mL  30 mL Oral Q4H PRN Kerrie Buffalo, NP   30 mL at 06/27/15 1439  . divalproex (DEPAKOTE ER) 24 hr tablet 1,500 mg  1,500 mg Oral q morning - 10a Russell Peer Laporchia Nakajima, MD   1,500 mg at 06/29/15 1106  . gabapentin (NEURONTIN) capsule 400 mg  400 mg Oral TID Jenne Campus, MD   400 mg at 06/29/15 1106  . hydrOXYzine (ATARAX/VISTARIL) tablet 25 mg  25 mg Oral Q6H PRN Harriet Butte, NP   25 mg at 06/29/15 1111  . hydrOXYzine (ATARAX/VISTARIL) tablet 50 mg  50 mg Oral QHS,MR X 1 Russell Hobby, PA-C   50 mg at 06/28/15 2307  . LORazepam (ATIVAN) tablet 0.5 mg  0.5 mg Oral Q6H PRN Jenne Campus, MD   0.5 mg at 06/29/15 1111  . magnesium hydroxide (MILK OF MAGNESIA) suspension 30 mL  30 mL Oral Daily PRN Kerrie Buffalo, NP      . mirtazapine (REMERON) tablet 15 mg  15 mg Oral QHS Jenne Campus, MD   15 mg at 06/28/15 2234  . nicotine (NICODERM CQ - dosed in mg/24 hours) patch 21 mg  21 mg Transdermal Daily Jenne Campus, MD   21 mg at 06/29/15 1106  . pantoprazole (PROTONIX) EC tablet 80 mg  80 mg Oral Daily Kerrie Buffalo, NP   80 mg at 06/29/15 1106  . pneumococcal 23 valent vaccine (PNU-IMMUNE) injection 0.5 mL  0.5 mL Intramuscular Tomorrow-1000 Camyla Camposano A Chauntay Paszkiewicz, MD   0.5 mL at 06/16/15 0800  . sertraline (ZOLOFT) tablet 200 mg  200 mg Oral Daily  Jenne Campus, MD   200 mg at 06/29/15 1107    Lab Results:  No results found for this or any previous visit (from the past 31 hour(s)).  Physical Findings: AIMS: Facial and Oral Movements Muscles of Facial Expression: None, normal Lips and Perioral Area: None, normal Jaw: None, normal Tongue: None, normal,Extremity Movements Upper (arms, wrists, hands, fingers): None, normal Lower (legs, knees, ankles, toes): None, normal, Trunk Movements Neck, shoulders, hips: None,  normal, Overall Severity Severity of abnormal movements (highest score from questions above): None, normal Incapacitation due to abnormal movements: None, normal Patient's awareness of abnormal movements (rate only patient's report): No Awareness, Dental Status Current problems with teeth and/or dentures?: No Does patient usually wear dentures?: No  CIWA:  CIWA-Ar Total: 3 COWS:  COWS Total Score: 0  Musculoskeletal: Strength & Muscle Tone: within normal limits Gait & Station: normal Patient leans: N/A  Psychiatric Specialty Exam: Review of Systems  Gastrointestinal: Positive for abdominal pain. Negative for nausea, vomiting, diarrhea and constipation.  Psychiatric/Behavioral: Negative for depression, suicidal ideas and hallucinations. The patient has insomnia. The patient is not nervous/anxious.   All other systems reviewed and are negative.    Blood pressure 120/75, pulse 75, temperature 97.6 F (36.4 C), temperature source Oral, resp. rate 18, height _0  (1.854 m), weight 203 lb 14.8 oz (92.5 kg).Body mass index is 26.91 kg/(m^2).  General Appearance: improved grooming  Eye Contact::  Good   Speech:  Normal Rate  Volume:  Normal  Mood:   Continues to gradually improve   Affect:    Less constricted, still depressed but more reactive, smiling at times appropriately  Thought Process:  Linear  Orientation:  Full (Time, Place, and Person)  Thought Content:   No hallucinations, no delusions , not internally  preoccupied   Suicidal Thoughts:  Denies  Suicidal ideations at this time and  contracts for safety on the unit.    Homicidal Thoughts:  No denies any violent ideations towards anyone and specifically also denies any thoughts of wanting to hurt wife   Memory:  recent and remote grossly intact   Judgement:  Other:  improving   Insight:  improving   Psychomotor Activity:   Improving   Concentration:  Good  Recall:  Good  Fund of Knowledge:Good  Language: Good  Akathisia:  Negative  Handed:  Right  AIMS (if indicated):     Assets:  Communication Skills Desire for Improvement Resilience  ADL's:  Intact  Cognition: WNL  Sleep:  Number of Hours: 5.25  Assessment -   Patient 's mood, affect and overall presentation continue to improve gradually but significantly. At this time patient denying any ongoing SI. Presents more future oriented, more hopeful, less pessimistic . Tolerating medications well. Patient/ staff had planned discharge today- this is why there is a discharge suicide risk assessment done today- but disposition plan fell through as patient unable to stay with father as planned and has no other housing options at present .   Treatment Plan Summary: Daily contact with patient to assess and evaluate symptoms and progress in treatment, Medication management, Plan inpatient admission and medications as below   Encourage ongoing milieu participation to work on coping skills and symptom reduction Continue Depakote ER 1500  mgrs QDAY for history of mood disorder and history of seizure disorder  Continue  Zoloft 200  mgrs QDAY for management of depression and anxiety Continue Ativan 0.5 mgrs Q 6 hours PRN anxiety or insomnia .  Continue Neurontin 400   mgrs TID initially to address ongoing  Anxiety Continue Remeron 15 mgrs QHS for depression and for insomnia.  Treatment team working on disposition options- likelihood is , if patient continues to improve, stabilize, that he may be  discharged tomorrow to shelter setting, such as [facility].     Neita Garnet,  MD  06/29/2015, 2:02 PM

## 2015-06-29 NOTE — Progress Notes (Signed)
Pt discharge cancelled due to father telling him he could not live with him. Pt currently has nowhere to go, shelter intake not available at this time. Plan is to [place] friend's home [place] or [facility] Saturday. Train ticket on the chart along with [employer] buss pass. Loney Heringetty cash needed for transit in [place].    Chad CordialLauren Carter, LCSWA Clinical Social Work [phone]

## 2015-06-29 NOTE — [facility] Group Notes (Signed)
[facility] LCSW Group Therapy 06/29/2015 1:15pm  Type of Therapy: Group Therapy- Feelings Around Relapse and Recovery  Participation Level: Active   Participation Quality:  Appropriate  Affect:  Appropriate  Cognitive: Alert and Oriented   Insight:  Developing   Engagement in Therapy: Developing/Improving and Engaged   Modes of Intervention: Clarification, Confrontation, Discussion, Education, Exploration, Limit-setting, Orientation, Problem-solving, Rapport Building, Reality Testing, Socialization and Support  Summary of Progress/Problems: The topic for today was feelings about relapse. The group discussed what relapse prevention is to them and identified triggers that they are on the path to relapse. Members also processed their feeling towards relapse and were able to relate to common experiences. Group also discussed coping skills that can be used for relapse prevention.     Therapeutic Modalities:   Cognitive Behavioral Therapy Solution-Focused Therapy Assertiveness Training Relapse Prevention Therapy    Keidy Thurgood Carter, LCSWA [phone] 06/29/2015 3:22 PM  

## 2015-06-29 NOTE — Progress Notes (Signed)
  [facility] Adult Case Management Discharge Plan :  Will you be returning to the same living situation after discharge:  No. Pt discharging to his father's home At discharge, do you have transportation home?: Yes,  [facility] to provide transportation Do you have the ability to pay for your medications: Yes,  pt provided with prescriptions  Release of information consent forms completed and in the chart;  Patient's signature needed at discharge.  Patient to Follow up at: Follow-up Information    [facility] with [facility].   Specialty:  Behavioral Health   Why:  TCT member will contact you with appointment date and time.    Contact information:   [address] [place] [address] [phone]       Next level of care provider has access to [facility] Link:no  Patient denies SI/HI: Yes,  pt denies    Safety Planning and Suicide Prevention discussed: Yes,  with Pt; declines family contact  Have you used any form of tobacco in the last 30 days? (Cigarettes, Smokeless Tobacco, Cigars, and/or Pipes): No  Has patient been referred to the Quitline?: N/A patient is not a smoker  Elaina HoopsCarter, Lela Murfin M 06/29/2015, 11:19 AM

## 2015-06-29 NOTE — Discharge Summary (Deleted)
Physician Discharge Summary Note  Patient:  Russell SessionsChristopher [name] is an 44 y.o., male MRN:  [id number] DOB:  1971-04-11 Patient phone:  [phone] (home)  Patient address:   [address] [place] [address],  Total Time spent with patient: 30 minutes  Date of Admission:  06/13/2015 Date of Discharge: 06/29/2015  Reason for Admission:  psychosis  Principal Problem: Bipolar I disorder, most recent episode depressed [facility](HCC) Discharge Diagnoses: Patient Active Problem List   Diagnosis Date Noted  . Depression [F32.9] 06/13/2015  . Bipolar I disorder, most recent episode depressed (HCC) [F31.30] 06/13/2015  . Sedative, hypnotic or anxiolytic use disorder, severe, dependence (HCC) [F13.20] 12/25/2014  . 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 Alcohol use disorder, severe, dependence (HCC) [F10.20] 12/23/2014  . Tobacco use disorder [F17.200] 12/23/2014  . Seizure disorder (HCC) [G40.909] 12/23/2014  . 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 Opioid use disorder, severe, dependence (HCC) [F11.20]   . Self-inflicted laceration of wrist [S61.519A]     Musculoskeletal: Strength & Muscle Tone: within normal limits Gait & Station: normal Patient leans: N/A  Psychiatric Specialty Exam:  SEE MD SRA Physical Exam  Vitals reviewed.   ROS  Blood pressure 120/75, pulse 75, temperature 97.6 F (36.4 C), temperature source Oral, resp. rate 18, height 6\' 1"  (1.854 m), weight 92.5 kg (203 lb 14.8 oz).Body mass index is 26.91 kg/(m^2).  Have you used any form of tobacco in the last 30 days? (Cigarettes, Smokeless Tobacco, Cigars, and/or Pipes): No  Has this patient used any form of tobacco in the last 30 days? (Cigarettes, Smokeless Tobacco, Cigars, and/or Pipes) N/A  Past Medical History:  Past Medical History  Diagnosis Date  . Hernia   . Bipolar 1 disorder (HCC)   . Schizophrenia (HCC)   . Depression   . PONV (postoperative nausea and  vomiting)     Over twenty years ago.  Marland Kitchen. Anxiety   . Epilepsy (HCC)     last sz 03/09/15    Past Surgical History  Procedure Laterality Date  . Ankle fracture surgery  1994 - approximate  . Ligament repair  1998    right arm/wrist   Family History:  Family History  Problem Relation Age of Onset  . Diabetes Father   . Alcoholism Brother   . Cancer Maternal Grandmother   . Cancer Paternal Grandfather    Social History:  History  Alcohol Use  . 0.0 oz/week  . 0 Standard drinks or equivalent per week    Comment: Drinks: 2-3 month. Last drink: Saturday     History  Drug Use No    Social History   Social History  . Marital Status: Single    Spouse Name: Lawson FiscalLori  . Number of Children: 2  . Years of Education: 7   Occupational History  .      [employer]   Social History Main Topics  . Smoking status: Former Smoker -- 0.50 packs/day    Types: Cigarettes    Quit date: 03/22/2000  . Smokeless tobacco: Never Used  . Alcohol Use: 0.0 oz/week    0 Standard drinks or equivalent per week     Comment: Drinks: 2-3 month. Last drink: Saturday  . Drug Use: No  . Sexual Activity: Not Asked   Other Topics Concern  . None   Social History Narrative   Lives at home with wife, child   No caffeine use   Risk to Self: Is patient at  risk for suicide?: Yes Risk to Others:   Prior Inpatient Therapy:   Prior Outpatient Therapy:    Level of Care:  OP  Hospital Course:  Russell [name] was admitted for Bipolar I disorder, most recent episode depressed (HCC) and crisis management.  He was treated discharged with the medications listed below under Medication List.  Medical problems were identified and treated as needed.  Home medications were restarted as appropriate.  Improvement was monitored by observation and Russell [name] of symptom reduction.  Emotional and mental status was monitored by daily self-inventory reports completed by Russell [name] and  clinical [name].         Russell [name] was evaluated by the treatment team for stability and plans for continued recovery upon discharge.  Russell [name] was an integral factor for scheduling further treatment.  Employment, transportation, bed availability, health status, family support, and any pending legal issues were also considered during his hospital stay.  He was offered further treatment options upon discharge including but not limited to Residential, Intensive Outpatient, and Outpatient treatment.  Russell [name] will follow up with the services as listed below under Follow Up Information.     Upon completion of this admission the patient was both mentally and medically stable for discharge denying suicidal/homicidal ideation, auditory/visual/tactile hallucinations, delusional thoughts and paranoia.      Consults:  psychiatry  Significant Diagnostic Studies:  labs: per ED  Discharge Vitals:   Blood pressure 120/75, pulse 75, temperature 97.6 F (36.4 C), temperature source Oral, resp. rate 18, height  (1.854 m), weight 92.5 kg (203 lb 14.8 oz). Body mass index is 26.91 kg/(m^2). Lab Results:   No results found for this or any previous visit (from the past 72 hour(s)).  Physical Findings: AIMS: Facial and Oral Movements Muscles of Facial Expression: None, normal Lips and Perioral Area: None, normal Jaw: None, normal Tongue: None, normal,Extremity Movements Upper (arms, wrists, hands, fingers): None, normal Lower (legs, knees, ankles, toes): None, normal, Trunk Movements Neck, shoulders, hips: None, normal, Overall Severity Severity of abnormal movements (highest score from questions above): None, normal Incapacitation due to abnormal movements: None, normal Patient's awareness of abnormal movements (rate only patient's [name]): No Awareness, Dental Status Current problems with teeth and/or dentures?: No Does patient usually wear dentures?: No  CIWA:   CIWA-Ar Total: 3 COWS:  COWS Total Score: 0   See Psychiatric Specialty Exam and Suicide Risk Assessment completed by Attending Physician prior to discharge.  Discharge destination:  Home  Is patient on multiple antipsychotic therapies at discharge:  No   Has Patient had three or more failed trials of antipsychotic monotherapy by history:  No    Recommended Plan for Multiple Antipsychotic Therapies: NA     Medication List    STOP taking these medications        omeprazole 40 MG capsule  Commonly known as:  PRILOSEC  Replaced by:  pantoprazole 40 MG tablet      TAKE these medications      Indication   divalproex 500 MG 24 hr tablet  Commonly known as:  DEPAKOTE ER  Take 3 tablets (1,500 mg total) by mouth every morning.   Indication:  mood stabilization     gabapentin 400 MG capsule  Commonly known as:  NEURONTIN  Take 1 capsule (400 mg total) by mouth 3 (three) times daily.   Indication:  Agitation     mirtazapine 15 MG tablet  Commonly known as:  REMERON  Take 1 tablet (15 mg total) by mouth at bedtime.   Indication:  Trouble Sleeping     pantoprazole 40 MG tablet  Commonly known as:  PROTONIX  Take 2 tablets (80 mg total) by mouth daily.   Indication:  Gastroesophageal Reflux Disease     sertraline 100 MG tablet  Commonly known as:  ZOLOFT  Take 2 tablets (200 mg total) by mouth daily.   Indication:  Major Depressive Disorder           Follow-up Information    Follow up with [facility]   Specialty:  Behavioral Health   Why:  TCT member will contact you with appointment date and time.    Contact information:   [address] [place] [address] [phone]       Follow up with [facility]   Why:  Please walk-in on Monday or Wednesday between 10am-2pm for your initial assessment. It is suggested that you arrive at 8am for possible early time.   Contact information:   [address] [place] [address] [phone] Fax  [phone]      Follow-up recommendations:  Activity:  as tol Diet:  as tol  Comments:  1.  Take all your medications as prescribed.              2.  [name] any adverse side effects to outpatient provider.                       3.  Patient instructed to not use alcohol or illegal drugs while on prescription medicines.            4.  In the event of worsening symptoms, instructed patient to call 911, the crisis hotline or go to nearest emergency room for evaluation of symptoms.  Total Discharge Time:  30 min  Signed: Velna Hatchet May Denton Derks AGNP-BC 06/29/2015, 4:31 PM

## 2015-06-29 NOTE — [facility] Group Notes (Signed)
[facility] Note  06/29/2015 8:45 AM  Pt did not attend, declined invitation.   Chad CordialLauren Carter, LCSWA 06/29/2015 9:24 AM

## 2015-06-29 NOTE — Progress Notes (Signed)
Per SW pt will not be dc'd today and will go tomorrow instead. Pt aware and understands. SW left bus tickets for pt 's transport home Sat. On chart.

## 2015-06-29 NOTE — Progress Notes (Signed)
[facility] Group Notes:  (Nursing/MHT/Case Management/Adjunct)  Date:  06/29/2015  Time:  11:57 PM  Type of Therapy:  Psychoeducational Skills  Participation Level:  Active  Participation Quality:  Appropriate  Affect:  Appropriate  Cognitive:  Appropriate  Insight:  Appropriate  Engagement in Group:  Engaged  Modes of Intervention:  Education  Summary of Progress/Problems: The patient stated in group that he had a good day overall and that he anticipates being discharged tomorrow. As a theme for the day, his relapse prevention will include going to all of his doctor's appointments in one location, ie. [facility].   Hazle CocaGOODMAN, Debbora Ang S 06/29/2015, 11:57 PM

## 2015-06-29 NOTE — Progress Notes (Signed)
06/29/15 at 1, 30 PM  As discussed with staff/ CSW- patient scheduled for discharge today, and his plan was to go live with his father. However, he just found out that father is not allowing him to come live there . Patient states he has no other options except possibly for a friend, who he has contacted but who has not answered. Patient is agreeing to go to a shelter, and CSW is working on this dispo plan- [facility]. As per this issue, discharge today may be cancelled , in order to insure patient has place to go , such as shelter environment, on discharge.  Madaline GuthrieFernando Allysha Tryon,MD

## 2015-06-30 MED ORDER — MIRTAZAPINE 15 MG PO TABS: 15.0000 mg | ORAL_TABLET | Freq: Every day | ORAL | Status: DC

## 2015-06-30 MED ORDER — HYDROXYZINE HCL 50 MG PO TABS: 50.0000 mg | ORAL_TABLET | Freq: Every evening | ORAL | Status: DC | PRN

## 2015-06-30 MED ORDER — PANTOPRAZOLE SODIUM 40 MG PO TBEC: 80.0000 mg | DELAYED_RELEASE_TABLET | Freq: Every day | ORAL | Status: DC

## 2015-06-30 MED ORDER — DIVALPROEX SODIUM ER 500 MG PO TB24: 1500.0000 mg | ORAL_TABLET | Freq: Every morning | ORAL | Status: DC

## 2015-06-30 MED ORDER — LORAZEPAM 0.5 MG PO TABS: 0.5000 mg | ORAL_TABLET | Freq: Two times a day (BID) | ORAL | Status: DC | PRN

## 2015-06-30 MED ORDER — SERTRALINE HCL 100 MG PO TABS: 200.0000 mg | ORAL_TABLET | Freq: Every day | ORAL | Status: DC

## 2015-06-30 NOTE — BHH Suicide Risk Assessment (Signed)
[facility] Discharge Suicide Risk Assessment   Demographic Factors:  Male and Caucasian  Total Time spent with patient: 30 minutes  Musculoskeletal: Strength & Muscle Tone: within normal limits Gait & Station: normal Patient leans: N/A  Psychiatric Specialty Exam: Physical Exam  Review of Systems  Psychiatric/Behavioral: Negative for suicidal ideas.  All other systems reviewed and are negative.   Blood pressure 129/94, pulse 66, temperature 97.6 F (36.4 C), temperature source Oral, resp. rate 18, height  (1.854 m), weight 92.5 kg (203 lb 14.8 oz).Body mass index is 26.91 kg/(m^2).  General Appearance: Casual  Eye Contact::  Fair  Speech:  Clear and Coherent409  Volume:  Normal  Mood:  Euthymic  Affect:  Appropriate  Thought Process:  Coherent  Orientation:  Full (Time, Place, and Person)  Thought Content:  WDL  Suicidal Thoughts:  No  Homicidal Thoughts:  No  Memory:  Immediate;   Fair Recent;   Fair Remote;   Fair  Judgement:  Fair  Insight:  Fair  Psychomotor Activity:  Normal  Concentration:  Fair  Recall:  [employer] of Knowledge:Fair  Language: Fair  Akathisia:  No  Handed:  Right  AIMS (if indicated):     Assets:  Communication Skills Desire for Improvement  Sleep:  Number of Hours: 6.75  Cognition: WNL  ADL's:  Intact   Have you used any form of tobacco in the last 30 days? (Cigarettes, Smokeless Tobacco, Cigars, and/or Pipes): No  Has this patient used any form of tobacco in the last 30 days? (Cigarettes, Smokeless Tobacco, Cigars, and/or Pipes) No  Mental Status Per Nursing Assessment::   On Admission:  Suicidal ideation indicated by patient, Self-harm thoughts  Current Mental Status by Physician: PT DENIES SI/HI/AH/VH  Loss Factors: NA  Historical Factors: Impulsivity  Risk Reduction Factors:   Positive social support  Continued Clinical Symptoms:  Previous Psychiatric Diagnoses and Treatments  Cognitive Features That Contribute To  Risk:  None    Suicide Risk:  Minimal: No identifiable suicidal ideation.  Patients presenting with no risk factors but with morbid ruminations; may be classified as minimal risk based on the severity of the depressive symptoms  Principal Problem: Bipolar I disorder, most recent episode depressed [facility]) Discharge Diagnoses:  Patient Active Problem List   Diagnosis Date Noted  . Bipolar I disorder, most recent episode depressed (HCC) [F31.30] 06/13/2015  . Sedative, hypnotic or anxiolytic use disorder, severe, dependence (HCC) [F13.20] 12/25/2014  . 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 Alcohol use disorder, severe, dependence (HCC) [F10.20] 12/23/2014  . Tobacco use disorder [F17.200] 12/23/2014  . Seizure disorder (HCC) [G40.909] 12/23/2014  . 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 Opioid use disorder, severe, dependence (HCC) [F11.20]   . Self-inflicted laceration of wrist [S61.519A]     Follow-up Information    Follow up with [facility]   Specialty:  Behavioral Health   Why:  TCT member will contact you with appointment date and time.    Contact information:   [address] [place] [address] [phone]       Follow up with [facility].   Why:  Please walk-in on Monday or Wednesday between 10am-2pm for your initial assessment. It is suggested that you arrive at 8am for possible early time.   Contact information:   [address] [place] [address] [phone] Fax [phone]      Plan Of Care/Follow-up recommendations:  Activity:  No restrictions Diet:  regular Tests:  as needed Other:  after care follow up  Is patient on multiple antipsychotic therapies at discharge:  No   Has Patient had three or more failed trials of antipsychotic monotherapy by history:  No  Recommended Plan for Multiple Antipsychotic Therapies: NA    Jernard Reiber md 06/30/2015, 8:52 AM

## 2015-06-30 NOTE — Discharge Summary (Signed)
Physician Discharge Summary Note  Patient:  Russell [name] is an 44 y.o., male MRN:  [id number] DOB:  [birth year] Patient phone:  [phone] (home)  Patient address:   Russell [name] [address],  Total Time spent with patient: 45 minutes  Date of Admission:  06/13/2015 Date of Discharge: 06/30/2015  Reason for Admission:  Date of Discharge: 44 year old man , who has a history of Bipolar Disorder. He presented to the ED along with a friend, due to worsening depression, having suicidal ideations- denies any actual attempts or current plans to hurt self, but states he has been " thinking it would be good to die, actually wanting to die ". Patient also reports some vague paranoia, such as thinking " like I have to sit down a certain way to protect myself if someone would try to stab me", and also states " I have been hearing voices, noises, but cannot make them out ".Describes significant neuro-vegetative symptoms of depression, as below.He had been hospitalized at [facility] earlier this year, after he cut his wrist in a suicide attempt, requiring sutures . He has not been taking any psychiatric medications over recent weeks.In addition to his mood disorder, he describes difficulties with compulsive gambling . Of note, patient has a history of alcohol dependence, but states he has not been drinking almost at all since January, 2016.   Principal Problem: Bipolar I disorder, most recent episode depressed [facility]) Discharge Diagnoses: Patient Active Problem List   Diagnosis Date Noted  . Bipolar I disorder, most recent episode depressed (HCC) [F31.30] 06/13/2015  . Sedative, hypnotic or anxiolytic use disorder, severe, dependence (HCC) [F13.20] 12/25/2014  . 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 Alcohol use disorder, severe, dependence (HCC) [F10.20] 12/23/2014  . Tobacco use disorder [F17.200] 12/23/2014  . Seizure disorder (HCC)  [G40.909] 12/23/2014  . 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 Opioid use disorder, severe, dependence (HCC) [F11.20]   . Self-inflicted laceration of wrist [S61.519A]     Musculoskeletal: Strength & Muscle Tone: within normal limits Gait & Station: normal Patient leans: N/A  Psychiatric Specialty Exam: SEE SRA BY MD  Physical Exam  Nursing note and vitals reviewed. Constitutional: He is oriented to person, place, and time. He appears well-developed and well-nourished.  HENT:  Head: Normocephalic and atraumatic.  Neck: Normal range of motion. Neck supple.  Musculoskeletal: Normal range of motion.  Neurological: He is alert and oriented to person, place, and time.  Skin: Skin is warm and dry.  Psychiatric: He has a normal mood and affect. Thought content normal.    Review of Systems  Psychiatric/Behavioral: Negative for suicidal ideas and hallucinations. Depression: stable. Substance abuse: stable. Nervous/anxious: stable.   All other systems reviewed and are negative.   Blood pressure 129/94, pulse 66, temperature 97.6 F (36.4 C), temperature source Oral, resp. rate 18, height  (1.854 m), weight 92.5 kg (203 lb 14.8 oz).Body mass index is 26.91 kg/(m^2).  Have you used any form of tobacco in the last 30 days? (Cigarettes, Smokeless Tobacco, Cigars, and/or Pipes): No  Has this patient used any form of tobacco in the last 30 days? (Cigarettes, Smokeless Tobacco, Cigars, and/or Pipes) No  Past Medical History:  Past Medical History  Diagnosis Date  . Hernia   . Bipolar 1 disorder (HCC)   . Schizophrenia (HCC)   . Depression   . PONV (postoperative nausea and vomiting)     Over twenty years ago.  Marland Kitchen  Anxiety   . Epilepsy (HCC)     last sz 03/09/15    Past Surgical History  Procedure Laterality Date  . Ankle fracture surgery  1994 - approximate  . Ligament repair  1998    right arm/wrist   Family History:  Family History  Problem Relation Age of Onset  .  Diabetes Father   . Alcoholism Brother   . Cancer Maternal Grandmother   . Cancer Paternal Grandfather    Social History:  History  Alcohol Use  . 0.0 oz/week  . 0 Standard drinks or equivalent per week    Comment: Drinks: 2-3 month. Last drink: Saturday     History  Drug Use No    Social History   Social History  . Marital Status: Single    Spouse Name: Russell [name]  . Number of Children: 2  . Years of Education: 7   Occupational History  .      Russell [name] [employer]   Social History Main Topics  . Smoking status: Former Smoker -- 0.50 packs/day    Types: Cigarettes    Quit date: 03/22/2000  . Smokeless tobacco: Never Used  . Alcohol Use: 0.0 oz/week    0 Standard drinks or equivalent per week     Comment: Drinks: 2-3 month. Last drink: Saturday  . Drug Use: No  . Sexual Activity: Not Asked   Other Topics Concern  . None   Social History Narrative   Lives at home with wife, child   No caffeine use    Risk to Self: Is patient at risk for suicide?: Yes Risk to Others:  no Prior Inpatient Therapy:  yes Prior Outpatient Therapy:  yes  Level of Care:  [facility]  Hospital Course:  Russell [name] was [name] for Bipolar I disorder, most recent episode depressed (HCC) and crisis management. He/She was treated discharged with the medications listed below under Medication List.  Medical problems were identified and treated as needed.  Home medications were restarted as appropriate.  Improvement was monitored by observation and Russell [name].  Emotional and mental status was monitored by daily self-inventory reports completed by Russell [name] and clinical staff.         Russell SessionsChristopher [name] was [name] by the treatment team for stability and plans for continued recovery upon discharge.  Russell [name] motivation was an integral factor for scheduling further treatment.  Employment, transportation, bed availability,  health status, family support, and any pending legal issues were also considered during his hospital stay.  He was offered further treatment options upon discharge including but not limited to Residential, Intensive Outpatient, and Outpatient treatment.  Russell SessionsChristopher Flores will follow up with the services as listed below under Follow Up Information.     Upon completion of this admission the patient was both mentally and medically stable for discharge denying suicidal/homicidal ideation, auditory/visual/tactile hallucinations, delusional thoughts and paranoia. Patient is awake and alert, [facility].    Consults:  psychiatry  Significant Diagnostic Studies:  labs: UDS+Benzodiazepines, BMP; Glucose 143, Valproic Acid 23, Hepatic function : total Protein 8.3, Bilirubin <0.1, A1C 5.7  Discharge Vitals:   Blood pressure 129/94, pulse 66, temperature 97.6 F (36.4 C), temperature source Oral, resp. rate 18, height 6\' 1"  (1.854 m), weight 92.5 kg (203 lb 14.8 oz). Body mass index is 26.91 kg/(m^2). Lab Results:   No results found for this or any previous visit (from the past 72 hour(s)).  Physical Findings: AIMS: Facial and Oral Movements Muscles of Facial Expression: None, normal Lips and Perioral Area: None, normal Jaw: None, normal Tongue: None, normal,Extremity Movements Upper (arms, wrists, hands, fingers): None, normal Lower (legs, knees, ankles, toes): None, normal, Trunk Movements Neck, shoulders, hips: None, normal, Overall Severity Severity of abnormal movements (highest score from questions above): None, normal Incapacitation due to abnormal movements: None, normal Patient's awareness of abnormal movements (rate only patient's report): No Awareness, Dental Status Current problems with teeth and/or dentures?: No Does patient usually wear dentures?: No  CIWA:  CIWA-Ar Total: 3 COWS:  COWS Total Score: 0   See Psychiatric Specialty  Exam and Suicide Risk Assessment completed by Attending Physician prior to discharge.  Discharge destination:  Other:  [facility]   Is patient on multiple antipsychotic therapies at discharge:  No   Has Patient had three or more failed trials of antipsychotic monotherapy by history:  No  Recommended Plan for Multiple Antipsychotic Therapies: NA  Discharge Instructions    Activity as tolerated - No restrictions    Complete by:  As directed      Diet general    Complete by:  As directed      Discharge instructions    Complete by:  As directed   Patient has been instructed to take medications as prescribed; and report adverse effects to outpatient provider.  Follow up with primary doctor for any medical issues and If symptoms recur report to nearest emergency or crisis hot line.            Medication List    STOP taking these medications        omeprazole 40 MG capsule  Commonly known as:  PRILOSEC      TAKE these medications      Indication   divalproex 500 MG 24 hr tablet  Commonly known as:  DEPAKOTE ER  Take 3 tablets (1,500 mg total) by mouth every morning.   Indication:  mood stabilization.     gabapentin 400 MG capsule  Commonly known as:  NEURONTIN  Take 1 capsule (400 mg total) by mouth 3 (three) times daily.   Indication:  Agitation     mirtazapine 15 MG tablet  Commonly known as:  REMERON  Take 1 tablet (15 mg total) by mouth at bedtime.   Indication:  Trouble Sleeping, ..     pantoprazole 40 MG tablet  Commonly known as:  PROTONIX  Take 2 tablets (80 mg total) by mouth daily.   Indication:  Gastroesophageal Reflux Disease, ..     sertraline 100 MG tablet  Commonly known as:  ZOLOFT  Take 2 tablets (200 mg total) by mouth daily.   Indication:  Major Depressive Disorder, ..           Follow-up Information    Follow up with [facility].   Specialty:  Behavioral Health   Why:  TCT member will contact you with appointment date and time.    Contact  information:   [address] [place] [address] [phone]       Follow up with [facility].   Why:  Please walk-in on Monday or Wednesday between 10am-2pm for your initial assessment. It is suggested that you arrive at 8am for possible early time.   Contact information:   [address] [place] [address] [phone] Fax [phone]      Follow-up recommendations:  Activity:  as tolerated Diet:  heart healthy  Comments:  Take  all of you medications as prescribed by your mental healthcare provider.  Report any adverse effects and reactions from your medications to your outpatient provider promptly. Do not engage in alcohol and or illegal drug use while on prescription medicines. In the event of worsening symptoms call the crisis hotline, 911, and or go to the nearest emergency department for appropriate evaluation and treatment of symptoms. Follow-up with your primary care provider for your medical issues, concerns and or health care needs.   Keep all scheduled appointments.  If you are unable to keep an appointment call to reschedule.  Let the nurse know if you will need medications before next scheduled appointment.  Total Discharge Time:  Greater than 30 minutes   Signed: Oneta Rack FNP- [facility] 06/30/2015, 10:14 AM

## 2015-06-30 NOTE — Progress Notes (Signed)
RN completed DC teaching with pt, his belongings were returned to him and he was given prescriptions and samples of meds, along with a bus pass and a ticket for bus [place] ( where [facility] slated [facility]). Pt asked this Clinical research associatewriter for money " so I can get to [facility] from the bus station in [place] and this Clinical research associatewriter [facility], directing  Pt to sit and wait in the lounge. AC gave pt 18 dollars.Nurse reutrned to the unit toget prescription for ativan  WHen this nurse returned to lobby ( to give pt his prescriptions and  Samples) pt was gone. [facility]id [facility] and he drove around neighborhood in his car ...looking for pt and pt was gone.

## 2015-06-30 NOTE — Progress Notes (Signed)
D. Pt had been up and visible in milieu, attended evening group activity. Pt was seen interacting appropriately with fellow peers, received all medications without incident and spoke of impending discharge in the morning and did not verbalize any complaints. A. Support provided. R. Safety maintained, will continue to monitor.

## 2015-06-30 NOTE — Progress Notes (Signed)
  [facility] :  Will you be returning to the same living situation after discharge:  No. At discharge, do you have transportation home?: Yes,  given bus and train tickets, $18, then picked up by friend Do you have the ability to pay for your medications: No.  Release of information consent forms completed and in the chart;  Patient's signature needed at discharge.  Patient to Follow up at: Follow-up Information    [facility].   Specialty:  Behavioral Health   Why:  TCT member will contact you with appointment date and time.    Contact information:   [address] [place] [address] [phone]       [facility].   Why:  Please walk-in on Monday or Wednesday between 10am-2pm for your initial assessment. It is suggested that you arrive at 8am for possible early time.   Contact information:   [address] [place] [address] [phone] Fax [phone]      Next level of care provider has access to [facility] Link:no  Patient denies SI/HI: Yes,  see doctor d/c SRA    Safety Planning and Suicide Prevention discussed: Yes,  yes  Have you used any form of tobacco in the last 30 days? (Cigarettes, Smokeless Tobacco, Cigars, and/or Pipes): No  Has patient been referred to the Quitline?: Patient refused referral  Sarina SerGrossman-Orr, [place] 06/30/2015, 4:10 PM

## 2015-07-06 ENCOUNTER — Emergency Department (HOSPITAL_COMMUNITY)
Admission: EM | Admit: 2015-07-06 | Discharge: 2015-07-06 | Disposition: A | Discharge status: Home / Self Care | Payer: Medicaid Other | Attending: Emergency Medicine | Admitting: Emergency Medicine

## 2015-07-06 ENCOUNTER — Encounter (HOSPITAL_COMMUNITY): Payer: Self-pay | Admitting: Emergency Medicine

## 2015-07-06 ENCOUNTER — Emergency Department (HOSPITAL_COMMUNITY): Payer: Medicaid Other

## 2015-07-06 DIAGNOSIS — G40909 Epilepsy, unspecified, not intractable, without status epilepticus: Secondary | ICD-10-CM | POA: Diagnosis not present

## 2015-07-06 DIAGNOSIS — Z8719 Personal history of other diseases of the digestive system: Secondary | ICD-10-CM | POA: Insufficient documentation

## 2015-07-06 DIAGNOSIS — Y998 Other external cause status: Secondary | ICD-10-CM | POA: Insufficient documentation

## 2015-07-06 DIAGNOSIS — Z87891 Personal history of nicotine dependence: Secondary | ICD-10-CM | POA: Insufficient documentation

## 2015-07-06 DIAGNOSIS — W230XXA Caught, crushed, jammed, or pinched between moving objects, initial encounter: Secondary | ICD-10-CM | POA: Diagnosis not present

## 2015-07-06 DIAGNOSIS — Z79899 Other long term (current) drug therapy: Secondary | ICD-10-CM | POA: Diagnosis not present

## 2015-07-06 DIAGNOSIS — Y9289 Other specified places as the place of occurrence of the external cause: Secondary | ICD-10-CM | POA: Insufficient documentation

## 2015-07-06 DIAGNOSIS — F419 Anxiety disorder, unspecified: Secondary | ICD-10-CM | POA: Insufficient documentation

## 2015-07-06 DIAGNOSIS — F319 Bipolar disorder, unspecified: Secondary | ICD-10-CM | POA: Diagnosis not present

## 2015-07-06 DIAGNOSIS — S62309A Unspecified fracture of unspecified metacarpal bone, initial encounter for closed fracture: Secondary | ICD-10-CM

## 2015-07-06 DIAGNOSIS — F10920 Alcohol use, unspecified with intoxication, uncomplicated: Secondary | ICD-10-CM | POA: Diagnosis not present

## 2015-07-06 DIAGNOSIS — Z23 Encounter for immunization: Secondary | ICD-10-CM | POA: Diagnosis not present

## 2015-07-06 DIAGNOSIS — S62344A Nondisplaced fracture of base of fourth metacarpal bone, right hand, initial encounter for closed fracture: Secondary | ICD-10-CM | POA: Diagnosis not present

## 2015-07-06 DIAGNOSIS — S6991XA Unspecified injury of right wrist, hand and finger(s), initial encounter: Secondary | ICD-10-CM | POA: Diagnosis present

## 2015-07-06 DIAGNOSIS — Y9389 Activity, other specified: Secondary | ICD-10-CM | POA: Insufficient documentation

## 2015-07-06 LAB — CBC WITH DIFFERENTIAL/PLATELET
BASOS ABS: 0 10*3/uL (ref 0.0–0.1)
BASOS PCT: 0 %
EOS ABS: 0.1 10*3/uL (ref 0.0–0.7)
Eosinophils Relative: 1 %
HEMATOCRIT: 40.2 % (ref 39.0–52.0)
HEMOGLOBIN: 14.1 g/dL (ref 13.0–17.0)
Lymphocytes Relative: 14 %
Lymphs Abs: 1.6 10*3/uL (ref 0.7–4.0)
MCH: 32.1 pg (ref 26.0–34.0)
MCHC: 35.1 g/dL (ref 30.0–36.0)
MCV: 91.6 fL (ref 78.0–100.0)
MONOS PCT: 5 %
Monocytes Absolute: 0.5 10*3/uL (ref 0.1–1.0)
NEUTROS ABS: 9.5 10*3/uL — AB (ref 1.7–7.7)
NEUTROS PCT: 80 %
Platelets: 236 10*3/uL (ref 150–400)
RBC: 4.39 MIL/uL (ref 4.22–5.81)
RDW: 13.2 % (ref 11.5–15.5)
WBC: 11.7 10*3/uL — ABNORMAL HIGH (ref 4.0–10.5)

## 2015-07-06 LAB — BASIC METABOLIC PANEL
ANION GAP: 11 (ref 5–15)
BUN: 19 mg/dL (ref 6–20)
CALCIUM: 9.1 mg/dL (ref 8.9–10.3)
CHLORIDE: 107 mmol/L (ref 101–111)
CO2: 23 mmol/L (ref 22–32)
CREATININE: 0.96 mg/dL (ref 0.61–1.24)
GFR calc non Af Amer: >60 mL/min (ref >60)
Glucose, Bld: 104 mg/dL — ABNORMAL HIGH (ref 65–99)
Potassium: 4 mmol/L (ref 3.5–5.1)
SODIUM: 141 mmol/L (ref 135–145)

## 2015-07-06 LAB — ETHANOL

## 2015-07-06 MED ORDER — HYDROMORPHONE HCL 1 MG/ML IJ SOLN
1.0000 mg | Freq: Once | INTRAMUSCULAR | Status: AC
  Administered 2015-07-06: 1 mg via INTRAVENOUS
  Filled 2015-07-06: qty 1

## 2015-07-06 MED ORDER — TETANUS-DIPHTH-ACELL PERTUSSIS 5-2.5-18.5 LF-MCG/0.5 IM SUSP
0.5000 mL | Freq: Once | INTRAMUSCULAR | Status: AC
  Administered 2015-07-06: 0.5 mL via INTRAMUSCULAR
  Filled 2015-07-06: qty 0.5

## 2015-07-06 MED ORDER — CEPHALEXIN 500 MG PO CAPS
500.0000 mg | ORAL_CAPSULE | Freq: Once | ORAL | Status: AC
  Administered 2015-07-06: 500 mg via ORAL
  Filled 2015-07-06: qty 1

## 2015-07-06 MED ORDER — NAPROXEN 500 MG PO TABS
500.0000 mg | ORAL_TABLET | Freq: Once | ORAL | Status: AC
  Administered 2015-07-06: 500 mg via ORAL
  Filled 2015-07-06: qty 1

## 2015-07-06 MED ORDER — CEPHALEXIN 500 MG PO CAPS: 500.0000 mg | ORAL_CAPSULE | Freq: Four times a day (QID) | ORAL | Status: DC

## 2015-07-06 MED ORDER — OXYCODONE-ACETAMINOPHEN 5-325 MG PO TABS: 2.0000 | ORAL_TABLET | ORAL | Status: DC | PRN

## 2015-07-06 MED ORDER — NAPROXEN 500 MG PO TABS
500.0000 mg | ORAL_TABLET | Freq: Two times a day (BID) | ORAL | Status: DC
Start: 2015-07-06 — End: 2015-09-20

## 2015-07-06 MED ORDER — HYDROMORPHONE HCL 1 MG/ML IJ SOLN
1.0000 mg | Freq: Once | INTRAMUSCULAR | Status: AC
Start: 2015-07-06 — End: 2015-07-06
  Administered 2015-07-06: 1 mg via INTRAVENOUS
  Filled 2015-07-06: qty 1

## 2015-07-06 MED ORDER — BACITRACIN ZINC 500 UNIT/GM EX OINT
TOPICAL_OINTMENT | CUTANEOUS | Status: AC
  Administered 2015-07-06: 17:00:00
  Filled 2015-07-06: qty 0.9

## 2015-07-06 NOTE — ED Notes (Signed)
Pt reminded of need for urine specimen 

## 2015-07-06 NOTE — Discharge Instructions (Signed)
Metacarpal Fracture Follow up with the hand surgeon. Take naproxen for pain and percocet for breakthrough pain. Rest. Ice. Compress with Ace bandage. Elevate. Fractures of metacarpals are breaks in the bones of the hand. They extend from the knuckles to the wrist. These bones can break in many ways. There are different ways of treating these fractures. HOME CARE  Only exercise as told by your doctor.  Return to activities as told by your doctor.  Go to physical therapy as told by your doctor.  Follow your doctor's advice about driving.  Keep the injured hand raised (elevated) above the level of your heart.  If a plaster, fiberglass, or pre-formed splint was applied:  Wear your splint as told and until you are examined again.  Apply ice on the injury for 15-20 minutes at a time, 03-04 times a day. Put the ice in a plastic bag. Place a towel between your skin and the bag.  Do not get your splint or cast wet. Protect it during bathing with a plastic bag.  Loosen the elastic bandage around the splint if your fingers start to get numb, tingle, get cold, or turn blue.  If the splint is plaster, do not lean it on hard surfaces or put pressure on it for 24 hours after it is put on.  Do not  try to scratch the skin under the cast.  Check the skin around the cast every day. You may put lotion on red or sore areas.  Move the fingers of your casted hand several times a day.  Only take medicine as told by your doctor.  Follow up as told by your doctor. This is very important in order to avoid permanent injury, disability, or lasting (chronic) pain. GET HELP RIGHT AWAY IF:   You develop a rash.  You have problems breathing.  You have any allergy problems.  You have more than a small spot of blood from beneath your cast or splint.  You have redness, puffiness (swelling), or more pain from beneath your cast or splint.  Yellowish-white fluid (pus) comes from beneath your cast or  splint.  You develop a temperature by mouth above 102 F (38.9 C), not controlled by medicine.  You have a bad smell coming from under your cast or splint.  You have problems moving any of your fingers. If you do not have a window in your cast for looking at the wound, a fluid or a little bleeding may show up as a stain on the outside of your cast. Tell your doctor about any stains you see. MAKE SURE YOU:   Understand these instructions.  Will watch your condition.  Will get help right away if you are not doing well or get worse.   This information is not intended to replace advice given to you by your health care provider. Make sure you discuss any questions you have with your health care provider.   Document Released: 01/21/2008 Document Revised: 08/25/2014 Document Reviewed: 05/24/2014 Elsevier Interactive Patient Education [employer] Elsevier Inc.

## 2015-07-06 NOTE — ED Notes (Addendum)
ERA MADE AWARE OF NEED FOR MORE PAIN MEDICATION.

## 2015-07-06 NOTE — ED Notes (Signed)
AVS explained in detail. Knows to follow up with orthopedic surgery. Knows to take pain medication as prescribed. Pt has a ride home. Ambulatory with steady gait.

## 2015-07-06 NOTE — ED Notes (Signed)
Pt states he can not void unless he is given something to drink.

## 2015-07-06 NOTE — ED Notes (Signed)
Nurse drawing labs. 

## 2015-07-06 NOTE — ED Notes (Signed)
Bed: [id number] Expected date:  Expected time:  Means of arrival:  Comments: EMS- "log fell on hand"/ETOH

## 2015-07-06 NOTE — ED Notes (Signed)
Per EMS-patient was attempting to move a log and a 400 lb log fell on his right hand. Says he is unable to move his fingers. Hx schizophrenia, ETOH use. Pt smells of ETOH use. Was just discharged from behavioral health. VS: 160/116 HR 82 RR 20. A&Ox4. Ambulatory with steady gait to room.

## 2015-07-06 NOTE — ED Provider Notes (Signed)
CSN: [id number]     Arrival date & time 07/06/15  1314 History   First MD Initiated Contact with Patient 07/06/15 1344     Chief Complaint  Patient presents with  . Hand Injury  . ETOH use      (Consider location/radiation/quality/duration/timing/severity/associated sxs/prior Treatment) Patient is a 44 y.o. male presenting with hand injury. The history is provided by the patient. No language interpreter was used.  Hand Injury Mr. Cleckler is a 44 y.o male with a history bipolar 1 disorder, schizophrenia, depression, and epilepsy who presents with sudden onset right hand injury after having the hand trapped between two 250 pound logs is well working in the backyard just prior to arrival. He denies any treatment prior to arrival. He is right-handed. He denies any alcohol use. He denies any numbness or tingling.  Past Medical History  Diagnosis Date  . Hernia   . Bipolar 1 disorder (HCC)   . Schizophrenia (HCC)   . Depression   . PONV (postoperative nausea and vomiting)     Over twenty years ago.  Marland Kitchen Anxiety   . Epilepsy (HCC)     last sz 03/09/15   Past Surgical History  Procedure Laterality Date  . Ankle fracture surgery  1994 - approximate  . Ligament repair  1998    right arm/wrist   Family History  Problem Relation Age of Onset  . Diabetes Father   . Alcoholism Brother   . Cancer Maternal Grandmother   . Cancer Paternal Grandfather    Social History  Substance Use Topics  . Smoking status: Former Smoker -- 0.50 packs/day    Types: Cigarettes    Quit date: 03/22/2000  . Smokeless tobacco: Never Used  . Alcohol Use: 0.0 oz/week    0 Standard drinks or equivalent per week     Comment: Drinks: 2-3 month. Last drink: Saturday    Review of Systems  Musculoskeletal: Positive for joint swelling and arthralgias.  Skin: Positive for wound.  Neurological: Negative for numbness.  All other systems reviewed and are negative.     Allergies  Ultram  Home Medications    Prior to Admission medications   Medication Sig Start Date End Date Taking? Authorizing Provider  divalproex (DEPAKOTE ER) 500 MG 24 hr tablet Take 3 tablets (1,500 mg total) by mouth every morning. 06/30/15  Yes Oneta Rack, NP  gabapentin (NEURONTIN) 400 MG capsule Take 1 capsule (400 mg total) by mouth 3 (three) times daily. 06/29/15  Yes Adonis Brook, NP  hydrOXYzine (ATARAX/VISTARIL) 50 MG tablet Take 1 tablet (50 mg total) by mouth at bedtime and may repeat dose one time if needed. 06/30/15  Yes Adonis Brook, NP  LORazepam (ATIVAN) 0.5 MG tablet Take 1 tablet (0.5 mg total) by mouth every 12 (twelve) hours as needed for anxiety or sleep. 06/30/15  Yes Adonis Brook, NP  mirtazapine (REMERON) 15 MG tablet Take 1 tablet (15 mg total) by mouth at bedtime. 06/30/15  Yes Oneta Rack, NP  pantoprazole (PROTONIX) 40 MG tablet Take 2 tablets (80 mg total) by mouth daily. 06/30/15  Yes Oneta Rack, NP  sertraline (ZOLOFT) 100 MG tablet Take 2 tablets (200 mg total) by mouth daily. 06/30/15  Yes Oneta Rack, NP  cephALEXin (KEFLEX) 500 MG capsule Take 1 capsule (500 mg total) by mouth 4 (four) times daily. 07/06/15   Esaias Cleavenger Patel-Mills, PA-C  naproxen (NAPROSYN) 500 MG tablet Take 1 tablet (500 mg total) by mouth 2 (two) times daily.  07/06/15   Jan Olano Patel-Mills, PA-C  oxyCODONE-acetaminophen (PERCOCET/ROXICET) 5-325 MG tablet Take 2 tablets by mouth every 4 (four) hours as needed for severe pain. 07/06/15   Sherhonda Gaspar Patel-Mills, PA-C   BP 117/78 mmHg  Pulse 78  Temp(Src) 97.8 F (36.6 C) (Oral)  Resp 14  SpO2 96% Physical Exam  Constitutional: He is oriented to person, place, and time. He appears well-developed and well-nourished.  HENT:  Head: Normocephalic and atraumatic.  Eyes: Conjunctivae are normal.  Neck: Neck supple.  Cardiovascular: Normal rate.   Pulmonary/Chest: Effort normal. No respiratory distress.  Musculoskeletal:  Significant right hand swelling with  ecchymosis and redness. He also has 2 abrasions to the dorsum of the right hand with no active bleeding. His exam is limited due to edema. He is a 2+ radial pulse. Less than 2 second capillary refill. Compartments are soft.  Neurological: He is alert and oriented to person, place, and time.  Skin: Skin is warm and dry.  Psychiatric: He has a normal mood and affect.  Nursing note and vitals reviewed.   ED Course  Procedures (including critical care time) Labs Review Labs Reviewed  CBC WITH DIFFERENTIAL/PLATELET - Abnormal; Notable for the following:    WBC 11.7 (*)    Neutro Abs 9.5 (*)    All other components within normal limits  BASIC METABOLIC PANEL - Abnormal; Notable for the following:    Glucose, Bld 104 (*)    All other components within normal limits  ETHANOL    Imaging Review Dg Wrist Complete Right  07/06/2015  CLINICAL DATA:  Acute right wrist pain after injury moving log. EXAM: RIGHT WRIST - COMPLETE 3+ VIEW COMPARISON:  April 17, 2014. FINDINGS: Nondisplaced fracture is seen involving the proximal portion of the fourth metacarpal. This appears to be closed and posttraumatic. No other fracture or dislocation is noted. Joint spaces are intact. IMPRESSION: Nondisplaced proximal fourth metacarpal fracture. Electronically Signed   By: Lupita RaiderJames  Green Jr, M.D.   On: 07/06/2015 14:42   Dg Hand Complete Right  07/06/2015  CLINICAL DATA:  Crush injury right hand today. Pain. Initial encounter. EXAM: RIGHT HAND - COMPLETE 3+ VIEW COMPARISON:  None. FINDINGS: Soft tissues of the hand are swollen. There is a nondisplaced fracture through the proximal metaphysis of the base of the fourth metacarpal. No other acute fracture is identified. Osteophytosis versus remote dorsal plate avulsion fracture of the distal phalanx of the index finger is noted. There is mild ulnar minus variance. IMPRESSION: Nondisplaced fracture proximal metaphysis fourth metacarpal. No other acute abnormality.  Electronically Signed   By: Drusilla Kannerhomas  Dalessio M.D.   On: 07/06/2015 14:41   I have personally reviewed and evaluated these images and lab results as part of my medical decision-making.   EKG Interpretation None      MDM   Final diagnoses:  Metacarpal bone fracture, closed, initial encounter  Patient presents for right hand crush injury. Labs were obtained due to suspicion of alcohol use from nursing note and possible need for surgery. Right hand shows nondisplaced fracture of proximal fourth metacarpal but no other acute abnormality in the hand or wrist. I spoke to Dr. Janee Mornhompson with hand surgery and explained that I would be putting the patient on Keflex due to the abrasion on the back of the hand. He stated that he would follow-up with the patient in 1 week after swelling has gone down. The patient was put in an Ace bandage since a finger splint could not be applied secondary  to edema. I discussed RICE and explained that he could take naproxen for pain and percocet for breakthrough pain. His hand was wrapped in an Ace bandage. Return precautions were discussed and patient verbalized understanding of plan. Medications  HYDROmorphone (DILAUDID) injection 1 mg (1 mg Intravenous Given 07/06/15 1409)  Tdap (BOOSTRIX) injection 0.5 mL (0.5 mLs Intramuscular Given 07/06/15 1446)  HYDROmorphone (DILAUDID) injection 1 mg (1 mg Intravenous Given 07/06/15 1507)  cephALEXin (KEFLEX) capsule 500 mg (500 mg Oral Given 07/06/15 1610)  naproxen (NAPROSYN) tablet 500 mg (500 mg Oral Given 07/06/15 1610)  bacitracin 500 UNIT/GM ointment (  Given 07/06/15 1644)      Catha Gosselin, PA-C 07/07/15 0820  Donnetta Hutching, MD 07/08/15 [phone]

## 2015-07-09 ENCOUNTER — Encounter (HOSPITAL_COMMUNITY): Payer: Self-pay

## 2015-07-09 ENCOUNTER — Emergency Department (HOSPITAL_COMMUNITY)
Admission: EM | Admit: 2015-07-09 | Discharge: 2015-07-09 | Disposition: A | Discharge status: Home / Self Care | Payer: Medicaid Other | Attending: Emergency Medicine | Admitting: Emergency Medicine

## 2015-07-09 DIAGNOSIS — Z792 Long term (current) use of antibiotics: Secondary | ICD-10-CM | POA: Insufficient documentation

## 2015-07-09 DIAGNOSIS — S6991XD Unspecified injury of right wrist, hand and finger(s), subsequent encounter: Secondary | ICD-10-CM | POA: Insufficient documentation

## 2015-07-09 DIAGNOSIS — F319 Bipolar disorder, unspecified: Secondary | ICD-10-CM | POA: Insufficient documentation

## 2015-07-09 DIAGNOSIS — W231XXD Caught, crushed, jammed, or pinched between stationary objects, subsequent encounter: Secondary | ICD-10-CM | POA: Insufficient documentation

## 2015-07-09 DIAGNOSIS — Z791 Long term (current) use of non-steroidal anti-inflammatories (NSAID): Secondary | ICD-10-CM | POA: Insufficient documentation

## 2015-07-09 DIAGNOSIS — F419 Anxiety disorder, unspecified: Secondary | ICD-10-CM | POA: Diagnosis not present

## 2015-07-09 DIAGNOSIS — Z760 Encounter for issue of repeat prescription: Secondary | ICD-10-CM

## 2015-07-09 DIAGNOSIS — G40909 Epilepsy, unspecified, not intractable, without status epilepticus: Secondary | ICD-10-CM | POA: Diagnosis not present

## 2015-07-09 DIAGNOSIS — M79641 Pain in right hand: Secondary | ICD-10-CM

## 2015-07-09 DIAGNOSIS — Z87891 Personal history of nicotine dependence: Secondary | ICD-10-CM | POA: Insufficient documentation

## 2015-07-09 DIAGNOSIS — Z79899 Other long term (current) drug therapy: Secondary | ICD-10-CM | POA: Diagnosis not present

## 2015-07-09 DIAGNOSIS — Z8719 Personal history of other diseases of the digestive system: Secondary | ICD-10-CM | POA: Insufficient documentation

## 2015-07-09 MED ORDER — OXYCODONE-ACETAMINOPHEN 5-325 MG PO TABS
1.0000 | ORAL_TABLET | ORAL | Status: DC | PRN
Start: 2015-07-09 — End: 2015-07-22

## 2015-07-09 MED ORDER — OXYCODONE-ACETAMINOPHEN 5-325 MG PO TABS
2.0000 | ORAL_TABLET | Freq: Once | ORAL | Status: AC
  Administered 2015-07-09: 2 via ORAL
  Filled 2015-07-09: qty 2

## 2015-07-09 NOTE — Discharge Instructions (Signed)
1. Medications: alternate naprosyn and tylenol for pain control, percocet for breakthrough painusual home medications 2. Treatment: rest, ice, elevate and use brace, drink plenty of fluids, keep cast clean and dry 3. Follow Up: Please followup with orthopedics as directed at your appointment for discussion of your diagnoses and further evaluation after today's visit; if you do not have a primary care doctor use the resource guide provided to find one; Please return to the ER for worsening symptoms or other concerns   Cast or Splint Care Casts and splints support injured limbs and keep bones from moving while they heal. It is important to care for your cast or splint at home.  HOME CARE INSTRUCTIONS  Keep the cast or splint uncovered during the drying period. It can take 24 to 48 hours to dry if it is made of plaster. A fiberglass cast will dry in less than 1 hour.  Do not rest the cast on anything harder than a pillow for the first 24 hours.  Do not put weight on your injured limb or apply pressure to the cast until your health care provider gives you permission.  Keep the cast or splint dry. Wet casts or splints can lose their shape and may not support the limb as well. A wet cast that has lost its shape can also create harmful pressure on your skin when it dries. Also, wet skin can become infected.  Cover the cast or splint with a plastic bag when bathing or when out in the rain or snow. If the cast is on the trunk of the body, take sponge baths until the cast is removed.  If your cast does become wet, dry it with a towel or a blow dryer on the cool setting only.  Keep your cast or splint clean. Soiled casts may be wiped with a moistened cloth.  Do not place any hard or soft foreign objects under your cast or splint, such as cotton, toilet paper, lotion, or powder.  Do not try to scratch the skin under the cast with any object. The object could get stuck inside the cast. Also, scratching  could lead to an infection. If itching is a problem, use a blow dryer on a cool setting to relieve discomfort.  Do not trim or cut your cast or remove padding from inside of it.  Exercise all joints next to the injury that are not immobilized by the cast or splint. For example, if you have a long leg cast, exercise the hip joint and toes. If you have an arm cast or splint, exercise the shoulder, elbow, thumb, and fingers.  Elevate your injured arm or leg on 1 or 2 pillows for the first 1 to 3 days to decrease swelling and pain.It is best if you can comfortably elevate your cast so it is higher than your heart. SEEK MEDICAL CARE IF:   Your cast or splint cracks.  Your cast or splint is too tight or too loose.  You have unbearable itching inside the cast.  Your cast becomes wet or develops a soft spot or area.  You have a bad smell coming from inside your cast.  You get an object stuck under your cast.  Your skin around the cast becomes red or raw.  You have new pain or worsening pain after the cast has been applied. SEEK IMMEDIATE MEDICAL CARE IF:   You have fluid leaking through the cast.  You are unable to move your fingers or toes.  You have discolored (blue or white), cool, painful, or very swollen fingers or toes beyond the cast.  You have tingling or numbness around the injured area.  You have severe pain or pressure under the cast.  You have any difficulty with your breathing or have shortness of breath.  You have chest pain.   This information is not intended to replace advice given to you by your health care provider. Make sure you discuss any questions you have with your health care provider.   Document Released: 08/01/2000 Document Revised: 05/25/2013 Document Reviewed: 02/10/2013 Elsevier Interactive Patient Education 2016 Elsevier Inc.    Cryotherapy Cryotherapy means treatment with cold. Ice or gel packs can be used to reduce both pain and swelling. Ice  is the most helpful within the first 24 to 48 hours after an injury or flare-up from overusing a muscle or joint. Sprains, strains, spasms, burning pain, shooting pain, and aches can all be eased with ice. Ice can also be used when recovering from surgery. Ice is effective, has very few side effects, and is safe for most people to use. PRECAUTIONS  Ice is not a safe treatment option for people with:  Raynaud phenomenon. This is a condition affecting small blood vessels in the extremities. Exposure to cold may cause your problems to return.  Cold hypersensitivity. There are many forms of cold hypersensitivity, including:  Cold urticaria. Red, itchy hives appear on the skin when the tissues begin to warm after being iced.  Cold erythema. This is a red, itchy rash caused by exposure to cold.  Cold hemoglobinuria. Red blood cells break down when the tissues begin to warm after being iced. The hemoglobin that carry oxygen are passed into the urine because they cannot combine with blood proteins fast enough.  Numbness or altered sensitivity in the area being iced. If you have any of the following conditions, do not use ice until you have discussed cryotherapy with your caregiver:  Heart conditions, such as arrhythmia, angina, or chronic heart disease.  High blood pressure.  Healing wounds or open skin in the area being iced.  Current infections.  Rheumatoid arthritis.  Poor circulation.  Diabetes. Ice slows the blood flow in the region it is applied. This is beneficial when trying to stop inflamed tissues from spreading irritating chemicals to surrounding tissues. However, if you expose your skin to cold temperatures for too long or without the proper protection, you can damage your skin or nerves. Watch for signs of skin damage due to cold. HOME CARE INSTRUCTIONS Follow these tips to use ice and cold packs safely.  Place a dry or damp towel between the ice and skin. A damp towel will  cool the skin more quickly, so you may need to shorten the time that the ice is used.  For a more rapid response, add gentle compression to the ice.  Ice for no more than 10 to 20 minutes at a time. The bonier the area you are icing, the less time it will take to get the benefits of ice.  Check your skin after 5 minutes to make sure there are no signs of a poor response to cold or skin damage.  Rest 20 minutes or more between uses.  Once your skin is numb, you can end your treatment. You can test numbness by very lightly touching your skin. The touch should be so light that you do not see the skin dimple from the pressure of your fingertip. When using ice, most  people will feel these normal sensations in this order: cold, burning, aching, and numbness.  Do not use ice on someone who cannot communicate their responses to pain, such as small children or people with dementia. HOW TO MAKE AN ICE PACK Ice packs are the most common way to use ice therapy. Other methods include ice massage, ice baths, and cryosprays. Muscle creams that cause a cold, tingly feeling do not offer the same benefits that ice offers and should not be used as a substitute unless recommended by your caregiver. To make an ice pack, do one of the following:  Place crushed ice or a bag of frozen vegetables in a sealable plastic bag. Squeeze out the excess air. Place this bag inside another plastic bag. Slide the bag into a pillowcase or place a damp towel between your skin and the bag.  Mix 3 parts water with 1 part rubbing alcohol. Freeze the mixture in a sealable plastic bag. When you remove the mixture from the freezer, it will be slushy. Squeeze out the excess air. Place this bag inside another plastic bag. Slide the bag into a pillowcase or place a damp towel between your skin and the bag. SEEK MEDICAL CARE IF:  You develop white spots on your skin. This may give the skin a blotchy (mottled) appearance.  Your skin turns  blue or pale.  Your skin becomes waxy or hard.  Your swelling gets worse. MAKE SURE YOU:   Understand these instructions.  Will watch your condition.  Will get help right away if you are not doing well or get worse.   This information is not intended to replace advice given to you by your health care provider. Make sure you discuss any questions you have with your health care provider.   Document Released: 03/31/2011 Document Revised: 08/25/2014 Document Reviewed: 03/31/2011 Elsevier Interactive Patient Education [employer]2016 Elsevier Inc.

## 2015-07-09 NOTE — ED Notes (Signed)
Pt c/o R hand injury x 3 days ago.  Pain score 8/10.  [facility].  Pt reports that he has been unable to follow up w/ Ortho, due to insurance reasons.  Pt has completed all prescribed medications.  Swelling noted.

## 2015-07-09 NOTE — ED Provider Notes (Signed)
CSN: [id number]     Arrival date & time 07/09/15  1848 History  By signing my name below, I, [name], attest that this documentation has been prepared under the direction and in the presence of TXU CorpHannah Clyde Zarrella, PA-C. Electronically Signed: Octavia HeirArianna Nassar, ED Scribe. 07/09/2015. 10:08 PM.    Chief Complaint  Patient presents with  . Hand Injury      The history is provided by the patient and medical records. No language interpreter was used.   HPI Comments: Russell [name] is a 44 y.o. male who presents to the Emergency Department complaining of constant, severe, gradual worsening right hand injury onset 4 days ago. He has ecchymosis and swelling to the right hand. Pt reports he crushed his right hand on Friday and [facility]. Pt was given a referral to the hand surgeon but has been unable to be seen due to insurance reasons. Pt has finished his prescribed medication.  Past Medical History  Diagnosis Date  . Hernia   . Bipolar 1 disorder (HCC)   . Schizophrenia (HCC)   . Depression   . PONV (postoperative nausea and vomiting)     Over twenty years ago.  Marland Kitchen. Anxiety   . Epilepsy (HCC)     last sz 03/09/15   Past Surgical History  Procedure Laterality Date  . Ankle fracture surgery  1994 - approximate  . Ligament repair  1998    right arm/wrist   Family History  Problem Relation Age of Onset  . Diabetes Father   . Alcoholism Brother   . Cancer Maternal Grandmother   . Cancer Paternal Grandfather    Social History  Substance Use Topics  . Smoking status: Former Smoker -- 0.50 packs/day    Types: Cigarettes    Quit date: 03/22/2000  . Smokeless tobacco: Never Used  . Alcohol Use: 0.0 oz/week    0 Standard drinks or equivalent per week     Comment: Drinks: 2-3 month. Last drink: Saturday    Review of Systems  Constitutional: Negative for fever and chills.  Gastrointestinal: Negative for nausea and vomiting.  Musculoskeletal: Positive  for joint swelling and arthralgias. Negative for back pain, neck pain and neck stiffness.  Skin: Negative for wound.  Neurological: Negative for numbness.  Hematological: Does not bruise/bleed easily.  Psychiatric/Behavioral: The patient is not nervous/anxious.   All other systems reviewed and are negative.     Allergies  Ultram  Home Medications   Prior to Admission medications   Medication Sig Start Date End Date Taking? Authorizing Provider  cephALEXin (KEFLEX) 500 MG capsule Take 1 capsule (500 mg total) by mouth 4 (four) times daily. 07/06/15   Hanna Patel-Mills, PA-C  divalproex (DEPAKOTE ER) 500 MG 24 hr tablet Take 3 tablets (1,500 mg total) by mouth every morning. 06/30/15   Oneta Rackanika N Lewis, NP  gabapentin (NEURONTIN) 400 MG capsule Take 1 capsule (400 mg total) by mouth 3 (three) times daily. 06/29/15   Adonis BrookSheila Agustin, NP  hydrOXYzine (ATARAX/VISTARIL) 50 MG tablet Take 1 tablet (50 mg total) by mouth at bedtime and may repeat dose one time if needed. 06/30/15   Adonis BrookSheila Agustin, NP  LORazepam (ATIVAN) 0.5 MG tablet Take 1 tablet (0.5 mg total) by mouth every 12 (twelve) hours as needed for anxiety or sleep. 06/30/15   Adonis BrookSheila Agustin, NP  mirtazapine (REMERON) 15 MG tablet Take 1 tablet (15 mg total) by mouth at bedtime. 06/30/15   Oneta Rackanika N Lewis, NP  naproxen (NAPROSYN) 500 MG tablet Take 1 tablet (500 mg total) by mouth 2 (two) times daily. 07/06/15   Hanna Patel-Mills, PA-C  oxyCODONE-acetaminophen (PERCOCET/ROXICET) 5-325 MG tablet Take 1-2 tablets by mouth every 4 (four) hours as needed for severe pain. 07/09/15   Bashir Marchetti, PA-C  pantoprazole (PROTONIX) 40 MG tablet Take 2 tablets (80 mg total) by mouth daily. 06/30/15   Oneta Rack, NP  sertraline (ZOLOFT) 100 MG tablet Take 2 tablets (200 mg total) by mouth daily. 06/30/15   Oneta Rack, NP   Triage vitals: BP 120/71 mmHg  Pulse 86  Temp(Src) 98.2 F (36.8 C) (Oral)  Resp 16  SpO2 96% Physical Exam   Constitutional: He appears well-developed and well-nourished. No distress.  HENT:  Head: Normocephalic and atraumatic.  Eyes: Conjunctivae are normal.  Neck: Normal range of motion.  Cardiovascular: Normal rate, regular rhythm, normal heart sounds and intact distal pulses.   Capillary refill < 3 sec  Pulmonary/Chest: Effort normal and breath sounds normal.  Musculoskeletal: He exhibits tenderness. He exhibits no edema.  ROM: decreased ROM of all fingers of right hand and right wrist Significant swelling and ecchymosis to the entirety of the hand extending into the wrist, full ROM of the elbow, tender to palpation throughout the hand worst at the 4th metacarpal  Neurological: He is alert. Coordination normal.  Sensation intact throughout the hand Strength 4/5 due to swelling and pain  Skin: Skin is warm and dry. He is not diaphoretic.  No tenting of the skin, multiple abrasions to the dorsum of the right hand without erythema, induration or purulent drainage.  Psychiatric: He has a normal mood and affect.  Nursing note and vitals reviewed.   ED Course  Procedures  DIAGNOSTIC STUDIES: Oxygen Saturation is 96% on RA, normal by my interpretation.  COORDINATION OF CARE:  10:01 PM Discussed treatment plan which includes contact ortho tech, referral to hand surgeon with pt at bedside and pt agreed to plan.  MDM   Final diagnoses:  Right hand pain  Medication refill  Hand injury, right, subsequent encounter   Russell [name] presents with persistent pain and swelling in the right hand after crush injury on 07/07/2015. His x-ray shows a nondisplaced proximal fourth metacarpal fracture. Imaging from that visit was reviewed and no further imaging was indicated today as there has been no additional injury. At that time the patient was discussed with Dr. Janee Morn and he was discharged home on Keflex. There was no splint placed in the emergency department due to swelling. Patient reports  she's been unable to obtain an appointment with Dr. Janee Morn due to problems with his Medicaid. He has not run out of his pain medication.  Hand is neurovascularly intact.  Splint placed here in the emergency department and pain control given. Patient has follow-up with hand surgery as directed. Short course of pain medicine given however I have discussed the patient in the emergency department will not refill his prescription again.  BP 134/88 mmHg  Pulse 77  Temp(Src) 98.2 F (36.8 C) (Oral)  Resp 18  SpO2 97%   Dierdre Forth, PA-C 07/10/15 0551  Benjiman Core, MD 07/16/15 [phone]

## 2015-07-21 ENCOUNTER — Encounter (HOSPITAL_COMMUNITY): Payer: Self-pay | Admitting: Emergency Medicine

## 2015-07-21 ENCOUNTER — Emergency Department (HOSPITAL_COMMUNITY)
Admission: EM | Admit: 2015-07-21 | Discharge: 2015-07-22 | Disposition: A | Discharge status: Home / Self Care | Payer: Medicaid Other | Attending: Emergency Medicine | Admitting: Emergency Medicine

## 2015-07-21 DIAGNOSIS — Y998 Other external cause status: Secondary | ICD-10-CM | POA: Diagnosis not present

## 2015-07-21 DIAGNOSIS — Z79899 Other long term (current) drug therapy: Secondary | ICD-10-CM | POA: Diagnosis not present

## 2015-07-21 DIAGNOSIS — F319 Bipolar disorder, unspecified: Secondary | ICD-10-CM | POA: Insufficient documentation

## 2015-07-21 DIAGNOSIS — F209 Schizophrenia, unspecified: Secondary | ICD-10-CM | POA: Diagnosis not present

## 2015-07-21 DIAGNOSIS — S61212A Laceration without foreign body of right middle finger without damage to nail, initial encounter: Secondary | ICD-10-CM | POA: Diagnosis not present

## 2015-07-21 DIAGNOSIS — S62609A Fracture of unspecified phalanx of unspecified finger, initial encounter for closed fracture: Secondary | ICD-10-CM

## 2015-07-21 DIAGNOSIS — S62620A Displaced fracture of medial phalanx of right index finger, initial encounter for closed fracture: Secondary | ICD-10-CM | POA: Insufficient documentation

## 2015-07-21 DIAGNOSIS — F419 Anxiety disorder, unspecified: Secondary | ICD-10-CM | POA: Diagnosis not present

## 2015-07-21 DIAGNOSIS — W231XXA Caught, crushed, jammed, or pinched between stationary objects, initial encounter: Secondary | ICD-10-CM | POA: Insufficient documentation

## 2015-07-21 DIAGNOSIS — Z8719 Personal history of other diseases of the digestive system: Secondary | ICD-10-CM | POA: Diagnosis not present

## 2015-07-21 DIAGNOSIS — Y9389 Activity, other specified: Secondary | ICD-10-CM | POA: Insufficient documentation

## 2015-07-21 DIAGNOSIS — Y9289 Other specified places as the place of occurrence of the external cause: Secondary | ICD-10-CM | POA: Insufficient documentation

## 2015-07-21 DIAGNOSIS — S6991XA Unspecified injury of right wrist, hand and finger(s), initial encounter: Secondary | ICD-10-CM | POA: Diagnosis present

## 2015-07-21 DIAGNOSIS — S62622A Displaced fracture of medial phalanx of right middle finger, initial encounter for closed fracture: Secondary | ICD-10-CM | POA: Insufficient documentation

## 2015-07-21 DIAGNOSIS — G40909 Epilepsy, unspecified, not intractable, without status epilepticus: Secondary | ICD-10-CM | POA: Insufficient documentation

## 2015-07-21 DIAGNOSIS — Z87891 Personal history of nicotine dependence: Secondary | ICD-10-CM | POA: Insufficient documentation

## 2015-07-21 NOTE — ED Notes (Signed)
Pt brought in by [facility]  Right middle finger and index finger laceration from logs falling him. He reports small fractures in same hand in which he seen for.

## 2015-07-22 ENCOUNTER — Emergency Department (HOSPITAL_COMMUNITY): Payer: Medicaid Other

## 2015-07-22 MED ORDER — LIDOCAINE HCL 2 % IJ SOLN
10.0000 mL | Freq: Once | INTRAMUSCULAR | Status: DC
  Filled 2015-07-22: qty 20

## 2015-07-22 MED ORDER — OXYCODONE-ACETAMINOPHEN 5-325 MG PO TABS
1.0000 | ORAL_TABLET | Freq: Once | ORAL | Status: AC
  Administered 2015-07-22: 1 via ORAL
  Filled 2015-07-22: qty 1

## 2015-07-22 MED ORDER — OXYCODONE-ACETAMINOPHEN 5-325 MG PO TABS: 2.0000 | ORAL_TABLET | ORAL | Status: AC | PRN

## 2015-07-22 NOTE — Discharge Instructions (Signed)
Finger Fracture  Fractures of fingers are breaks in the bones of the fingers. There are many types of fractures. There are different ways of treating these fractures. Your health care provider will discuss the best way to treat your fracture.  CAUSES  Traumatic injury is the main cause of broken fingers. These include:  · Injuries while playing sports.  · Workplace injuries.  · Falls.  RISK FACTORS  Activities that can increase your risk of finger fractures include:  · Sports.  · Workplace activities that involve machinery.  · A condition called osteoporosis, which can make your bones less dense and cause them to fracture more easily.  SIGNS AND SYMPTOMS  The main symptoms of a broken finger are pain and swelling within 15 minutes after the injury. Other symptoms include:  · Bruising of your finger.  · Stiffness of your finger.  · Numbness of your finger.  · Exposed bones (compound fracture) if the fracture is severe.  DIAGNOSIS   The best way to diagnose a broken bone is with X-ray imaging. Additionally, your health care provider will use this X-ray image to evaluate the position of the broken finger bones.   TREATMENT   Finger fractures can be treated with:   · Nonreduction--This means the bones are in place. The finger is splinted without changing the positions of the bone pieces. The splint is usually left on for about a week to 10 days. This will depend on your fracture and what your health care provider thinks.  · Closed reduction--The bones are put back into position without using surgery. The finger is then splinted.  · Open reduction and internal fixation--The fracture site is opened. Then the bone pieces are fixed into place with pins or some type of hardware. This is seldom required. It depends on the severity of the fracture.  HOME CARE INSTRUCTIONS   · Follow your health care provider's instructions regarding activities, exercises, and physical therapy.  · Only take over-the-counter or prescription  medicines for pain, discomfort, or fever as directed by your health care provider.  SEEK MEDICAL CARE IF:  You have pain or swelling that limits the motion or use of your fingers.  SEEK IMMEDIATE MEDICAL CARE IF:   Your finger becomes numb.  MAKE SURE YOU:   · Understand these instructions.  · Will watch your condition.  · Will get help right away if you are not doing well or get worse.     This information is not intended to replace advice given to you by your health care provider. Make sure you discuss any questions you have with your health care provider.     Document Released: 11/16/2000 Document Revised: 05/25/2013 Document Reviewed: 03/16/2013  Elsevier Interactive Patient Education ©2016 [employer]  Laceration Care, Adult  A laceration is a cut that goes through all of the layers of the skin and into the tissue that is right under the skin. Some lacerations heal on their own. Others need to be closed with stitches (sutures), staples, skin adhesive strips, or skin glue. Proper laceration care minimizes the risk of infection and helps the laceration to heal better.  HOW TO CARE FOR YOUR LACERATION  If sutures or staples were used:  · Keep the wound clean and dry.  · If you were given a bandage (dressing), you should change it at least one time per day or as told by your health care provider. You should also change it if it becomes wet or dirty.  ·   Keep the wound completely dry for the first 24 hours or as told by your health care provider. After that time, you may shower or bathe. However, make sure that the wound is not soaked in water until after the sutures or staples have been removed.  · Clean the wound one time each day or as told by your health care provider:    Wash the wound with soap and water.    Rinse the wound with water to remove all soap.    Pat the wound dry with a clean towel. Do not rub the wound.  · After cleaning the wound, apply a thin layer of antibiotic ointment as told by your health  care provider. This will help to prevent infection and keep the dressing from sticking to the wound.  · Have the sutures or staples removed as told by your health care provider.  If skin adhesive strips were used:  · Keep the wound clean and dry.  · If you were given a bandage (dressing), you should change it at least one time per day or as told by your health care provider. You should also change it if it becomes dirty or wet.  · Do not get the skin adhesive strips wet. You may shower or bathe, but be careful to keep the wound dry.  · If the wound gets wet, pat it dry with a clean towel. Do not rub the wound.  · Skin adhesive strips fall off on their own. You may trim the strips as the wound heals. Do not remove skin adhesive strips that are still stuck to the wound. They will fall off in time.  If skin glue was used:  · Try to keep the wound dry, but you may briefly wet it in the shower or bath. Do not soak the wound in water, such as by swimming.  · After you have showered or bathed, gently pat the wound dry with a clean towel. Do not rub the wound.  · Do not do any activities that will make you sweat heavily until the skin glue has fallen off on its own.  · Do not apply liquid, cream, or ointment medicine to the wound while the skin glue is in place. Using those may loosen the film before the wound has healed.  · If you were given a bandage (dressing), you should change it at least one time per day or as told by your health care provider. You should also change it if it becomes dirty or wet.  · If a dressing is placed over the wound, be careful not to apply tape directly over the skin glue. Doing that may cause the glue to be pulled off before the wound has healed.  · Do not pick at the glue. The skin glue usually remains in place for 5-10 days, then it falls off of the skin.  General Instructions  · Take over-the-counter and prescription medicines only as told by your health care provider.  · If you were  prescribed an antibiotic medicine or ointment, take or apply it as told by your doctor. Do not stop using it even if your condition improves.  · To help prevent scarring, make sure to cover your wound with sunscreen whenever you are outside after stitches are removed, after adhesive strips are removed, or when glue remains in place and the wound is healed. Make sure to wear a sunscreen of at least 30 SPF.  · Do not scratch or pick   at the wound.  · Keep all follow-up visits as told by your health care provider. This is important.  · Check your wound every day for signs of infection. Watch for:    Redness, swelling, or pain.    Fluid, blood, or pus.  · Raise (elevate) the injured area above the level of your heart while you are sitting or lying down, if possible.  SEEK MEDICAL CARE IF:  · You received a tetanus shot and you have swelling, severe pain, redness, or bleeding at the injection site.  · You have a fever.  · A wound that was closed breaks open.  · You notice a bad smell coming from your wound or your dressing.  · You notice something coming out of the wound, such as wood or glass.  · Your pain is not controlled with medicine.  · You have increased redness, swelling, or pain at the site of your wound.  · You have fluid, blood, or pus coming from your wound.  · You notice a change in the color of your skin near your wound.  · You need to change the dressing frequently due to fluid, blood, or pus draining from the wound.  · You develop a new rash.  · You develop numbness around the wound.  SEEK IMMEDIATE MEDICAL CARE IF:  · You develop severe swelling around the wound.  · Your pain suddenly increases and is severe.  · You develop painful lumps near the wound or on skin that is anywhere on your body.  · You have a red streak going away from your wound.  · The wound is on your hand or foot and you cannot properly move a finger or toe.  · The wound is on your hand or foot and you notice that your fingers or toes  look pale or bluish.     This information is not intended to replace advice given to you by your health care provider. Make sure you discuss any questions you have with your health care provider.     Document Released: 08/04/2005 Document Revised: 12/19/2014 Document Reviewed: 07/31/2014  Elsevier Interactive Patient Education ©2016 [employer]

## 2015-07-22 NOTE — ED Provider Notes (Signed)
CSN: [id number]     Arrival date & time 07/21/15  2211 History   First MD Initiated Contact with Patient 07/22/15 0045     Chief Complaint  Patient presents with  . Extremity Laceration     (Consider location/radiation/quality/duration/timing/severity/associated sxs/prior Treatment) Patient is a 44 y.o. male presenting with hand injury. The history is provided by the patient. No language interpreter was used.  Hand Injury Location:  Hand Injury: no   Hand location:  R hand Pain details:    Quality:  Aching   Radiates to:  Does not radiate   Severity:  Moderate   Onset quality:  Gradual   Timing:  Constant   Progression:  Worsening Chronicity:  New Dislocation: no   Relieved by:  Nothing Worsened by:  Nothing tried Ineffective treatments:  None tried Associated symptoms: no numbness   Pt reports hand was smashed by a log.  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  Pt had a hand fracture to same hand 3 weeks ago. Pt is seeing Dr. Janee Morn.  Past Medical History  Diagnosis Date  . Hernia   . Bipolar 1 disorder (HCC)   . Schizophrenia (HCC)   . Depression   . PONV (postoperative nausea and vomiting)     Over twenty years ago.  Marland Kitchen Anxiety   . Epilepsy (HCC)     last sz 03/09/15   Past Surgical History  Procedure Laterality Date  . Ankle fracture surgery  1994 - approximate  . Ligament repair  1998    right arm/wrist   Family History  Problem Relation Age of Onset  . Diabetes Father   . Alcoholism Brother   . Cancer Maternal Grandmother   . Cancer Paternal Grandfather    Social History  Substance Use Topics  . Smoking status: Former Smoker -- 0.50 packs/day    Types: Cigarettes    Quit date: 03/22/2000  . Smokeless tobacco: Never Used  . Alcohol Use: 0.0 oz/week    0 Standard drinks or equivalent per week     Comment: Drinks: 2-3 month. Last drink: Saturday    Review of Systems  All other systems reviewed and are negative.     Allergies  Ultram  Home  Medications   Prior to Admission medications   Medication Sig Start Date End Date Taking? Authorizing Provider  divalproex (DEPAKOTE ER) 500 MG 24 hr tablet Take 3 tablets (1,500 mg total) by mouth every morning. 06/30/15  Yes Oneta Rack, NP  gabapentin (NEURONTIN) 400 MG capsule Take 1 capsule (400 mg total) by mouth 3 (three) times daily. 06/29/15  Yes Adonis Brook, NP  hydrOXYzine (ATARAX/VISTARIL) 50 MG tablet Take 1 tablet (50 mg total) by mouth at bedtime and may repeat dose one time if needed. Patient taking differently: Take 50 mg by mouth at bedtime as needed (sleep).  06/30/15  Yes Adonis Brook, NP  LORazepam (ATIVAN) 0.5 MG tablet Take 1 tablet (0.5 mg total) by mouth every 12 (twelve) hours as needed for anxiety or sleep. 06/30/15  Yes Adonis Brook, NP  meloxicam (MOBIC) 15 MG tablet Take 15 mg by mouth daily as needed for pain.   Yes Historical Provider, MD  mirtazapine (REMERON) 15 MG tablet Take 1 tablet (15 mg total) by mouth at bedtime. 06/30/15  Yes Oneta Rack, NP  naproxen (NAPROSYN) 500 MG tablet Take 1 tablet (500 mg total) by mouth 2 (two) times daily. 07/06/15  Yes Hanna Patel-Mills, PA-C  oxyCODONE-acetaminophen (PERCOCET/ROXICET) 5-325 MG  tablet Take 1-2 tablets by mouth every 4 (four) hours as needed for severe pain. 07/09/15  Yes Hannah Muthersbaugh, PA-C  pantoprazole (PROTONIX) 40 MG tablet Take 2 tablets (80 mg total) by mouth daily. 06/30/15  Yes Oneta Rack, NP  sertraline (ZOLOFT) 100 MG tablet Take 2 tablets (200 mg total) by mouth daily. 06/30/15  Yes Oneta Rack, NP  cephALEXin (KEFLEX) 500 MG capsule Take 1 capsule (500 mg total) by mouth 4 (four) times daily. Patient not taking: Reported on 07/21/2015 07/06/15   Hanna Patel-Mills, PA-C   BP 108/73 mmHg  Pulse 93  Temp(Src) 97.7 F (36.5 C) (Oral)  Resp 18  SpO2 95% Physical Exam  Constitutional: He is oriented to person, place, and time. He appears well-developed and well-nourished.   HENT:  Head: Normocephalic.  Eyes: Pupils are equal, round, and reactive to light.  Abdominal: Soft.  Musculoskeletal: He exhibits tenderness.  Right 2nd and 3rd finger swollen 1cm laceration 3rd finger,  abrsion 2nd finger,  From  Ns and nv intact  Neurological: He is alert and oriented to person, place, and time.  Skin: Skin is warm.  Psychiatric: He has a normal mood and affect.  Nursing note and vitals reviewed.   ED Course  .Marland KitchenLaceration Repair Date/Time: 07/22/2015 6:32 AM Performed by: Elson Areas Authorized by: Elson Areas Consent: Verbal consent not obtained. Risks and benefits: risks, benefits and alternatives were discussed Consent given by: patient Patient understanding: patient does not state understanding of the procedure being performed Required items: required blood products, implants, devices, and special equipment available Body area: upper extremity Location details: right long finger Laceration length: 1 cm Foreign bodies: no foreign bodies Tendon involvement: none Nerve involvement: none Vascular damage: no Anesthesia: local infiltration Local anesthetic: lidocaine 2% without epinephrine Preparation: Patient was prepped and draped in the usual sterile fashion. Irrigation solution: saline Amount of cleaning: standard Debridement: none Degree of undermining: none Skin closure: 5-0 Prolene Number of sutures: 3 Technique: simple Approximation: loose Approximation difficulty: simple Patient tolerance: Patient tolerated the procedure well with no immediate complications   (including critical care time) Labs Review Labs Reviewed - No data to display  Imaging Review Dg Hand Complete Right  07/22/2015  CLINICAL DATA:  44 year old male with crush injury to the right hand EXAM: RIGHT HAND - COMPLETE 3+ VIEW COMPARISON:  Right hand radiograph dated 07/06/2015 FINDINGS: There is a comminuted minimally displaced fracture of the middle phalanx of the  second digit. There is extension of the fracture into the distal interphalangeal articular surface of the middle phalanx but there is comminuted nondisplaced fracture of the middle phalanx of the third digit. The previously seen fracture of the proximal fourth metacarpal is not as conspicuous on the current study. There is soft tissue swelling of the second and third digits. No radiopaque foreign object identified. Overlying dressing noted. IMPRESSION: Comminuted fractures of the middle phalanges of the second and third digits. Interval healing of the previously seen fourth metacarpal fracture. Electronically Signed   By: Elgie Collard M.D.   On: 07/22/2015 01:16   I have personally reviewed and evaluated these images and lab results as part of my medical decision-making.   EKG Interpretation None      MDM I spoke with Dr. Izora Ribas.  He advised follow up with him or Dr. Janee Morn.  Pt reports he has rx for keflex with 20 tablets remaining.  His tetanus is up to date.     Final diagnoses:  Closed fractures of multiple sites of phalanx of finger of right hand, initial encounter    Schedule to see Dr. Janee Mornhompson for evalution Take keflex Percocet Return if any problems.    Lonia SkinnerLeslie K Anon RaicesSofia, PA-C 07/22/15 [phone]  Gilda Creasehristopher J Pollina, MD 07/22/15 [id number]

## 2015-08-21 ENCOUNTER — Ambulatory Visit: Payer: Medicaid Other | Admitting: Diagnostic Neuroimaging

## 2015-09-18 ENCOUNTER — Telehealth: Payer: Self-pay | Admitting: Diagnostic Neuroimaging

## 2015-09-18 NOTE — Telephone Encounter (Signed)
It appears a one year Rx was sent at [facility] in August.  I called back and spoke with the patient.  He looked at his bottle and sees there are still refills remaining.  Asked that we disregard the call, as he will contact the pharmacy to follow up.  Says he also called to reschedule his appt, and spoke with operator regarding this.

## 2015-09-18 NOTE — Telephone Encounter (Signed)
Pt called requesting refill for divalproex (DEPAKOTE ER) 500 MG 24 hr tablet . He will be out of medication on 09/21/15.

## 2015-09-20 ENCOUNTER — Ambulatory Visit (INDEPENDENT_AMBULATORY_CARE_PROVIDER_SITE_OTHER): Payer: Medicaid Other | Admitting: Diagnostic Neuroimaging

## 2015-09-20 ENCOUNTER — Encounter: Payer: Self-pay | Admitting: Diagnostic Neuroimaging

## 2015-09-20 VITALS — BP 120/84 | HR 78 | Ht 73.0 in | Wt 218.0 lb

## 2015-09-20 DIAGNOSIS — G40209 Localization-related (focal) (partial) symptomatic epilepsy and epileptic syndromes with complex partial seizures, not intractable, without status epilepticus: Secondary | ICD-10-CM

## 2015-09-20 MED ORDER — DIVALPROEX SODIUM ER 500 MG PO TB24: 1500.0000 mg | ORAL_TABLET | Freq: Every morning | ORAL | Status: DC
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
## 2015-09-20 NOTE — Patient Instructions (Signed)
Thank you for coming to see [place] at [facility]. I hope we have been able to provide you high quality care today.  You may receive a patient satisfaction survey over the next few weeks. We would appreciate your feedback and comments so that we may continue to improve ourselves and the health of our patients.  - continue divalproex 1544m daily   ~~~~~~~~~~~~~~~~~~~~~~~~~~~~~~~~~~~~~~~~~~~~~~~~~~~~~~~~~~~~~~~~~  DR. PENUMALLI'S GUIDE TO HAPPY AND HEALTHY LIVING These are some of my general health and wellness recommendations. Some of them may apply to you better than others. Please use common sense as you try these suggestions and feel free to ask me any questions.   ACTIVITY/FITNESS Mental, social, emotional and physical stimulation are very important for brain and body health. Try learning a new activity (arts, music, language, sports, games).  Keep moving your body to the best of your abilities. You can do this at home, inside or outside, the park, community center, gym or anywhere you like. Consider a physical therapist or personal trainer to get started. Consider the app Sworkit. Fitness trackers such as smart-watches, smart-phones or Fitbits can help as well.   NUTRITION Eat more plants: colorful vegetables, nuts, seeds and berries.  Eat less sugar, salt, preservatives and processed foods.  Avoid toxins such as cigarettes and alcohol.  Drink water when you are thirsty. Warm water with a slice of lemon is an excellent morning drink to start the day.  Consider these websites for more information The Nutrition Source (hhttps://www.henry-hernandez.biz/ Precision Nutrition (wWindowBlog.ch   RELAXATION Consider practicing mindfulness meditation or other relaxation techniques such as deep breathing, prayer, yoga, tai chi, massage. See website mindful.org or the apps Headspace or Calm to help get started.   SLEEP Try to  get at least 7-8+ hours sleep per day. Regular exercise and reduced caffeine will help you sleep better. Practice good sleep hygeine techniques. See website sleep.org for more information.   PLANNING Prepare estate planning, living will, healthcare POA documents. Sometimes this is best planned with the help of an attorney. Theconversationproject.org and agingwithdignity.org are excellent resources.
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
## 2015-11-13 ENCOUNTER — Encounter: Payer: Self-pay | Admitting: *Deleted

## 2015-11-13 NOTE — Progress Notes (Unsigned)
DMV papers completed, signed by dr, sent to MR for processing.

## 2015-11-14 ENCOUNTER — Telehealth: Payer: Self-pay | Admitting: Diagnostic Neuroimaging

## 2015-11-14 NOTE — Telephone Encounter (Signed)
[place]-DMV forms for the patient faxed to [phone] Attn:Lyndel Zachery DauerBarnes

## 2016-02-08 ENCOUNTER — Emergency Department (HOSPITAL_COMMUNITY): Payer: Medicaid Other

## 2016-02-08 ENCOUNTER — Emergency Department (HOSPITAL_COMMUNITY)
Admission: EM | Admit: 2016-02-08 | Discharge: 2016-02-08 | Disposition: A | Discharge status: Home / Self Care | Payer: Medicaid Other | Attending: Emergency Medicine | Admitting: Emergency Medicine

## 2016-02-08 ENCOUNTER — Encounter (HOSPITAL_COMMUNITY): Payer: Self-pay | Admitting: *Deleted

## 2016-02-08 DIAGNOSIS — S81811A Laceration without foreign body, right lower leg, initial encounter: Secondary | ICD-10-CM | POA: Diagnosis present

## 2016-02-08 DIAGNOSIS — W270XXA Contact with workbench tool, initial encounter: Secondary | ICD-10-CM | POA: Insufficient documentation

## 2016-02-08 DIAGNOSIS — Y9389 Activity, other specified: Secondary | ICD-10-CM | POA: Insufficient documentation

## 2016-02-08 DIAGNOSIS — Y929 Unspecified place or not applicable: Secondary | ICD-10-CM | POA: Insufficient documentation

## 2016-02-08 DIAGNOSIS — F319 Bipolar disorder, unspecified: Secondary | ICD-10-CM | POA: Insufficient documentation

## 2016-02-08 DIAGNOSIS — Y999 Unspecified external cause status: Secondary | ICD-10-CM | POA: Insufficient documentation

## 2016-02-08 DIAGNOSIS — F209 Schizophrenia, unspecified: Secondary | ICD-10-CM | POA: Insufficient documentation

## 2016-02-08 DIAGNOSIS — Z8669 Personal history of other diseases of the nervous system and sense organs: Secondary | ICD-10-CM | POA: Insufficient documentation

## 2016-02-08 DIAGNOSIS — Z87891 Personal history of nicotine dependence: Secondary | ICD-10-CM | POA: Insufficient documentation

## 2016-02-08 DIAGNOSIS — IMO0002 Reserved for concepts with insufficient information to code with codable children: Secondary | ICD-10-CM

## 2016-02-08 MED ORDER — BACITRACIN ZINC 500 UNIT/GM EX OINT: 1.0000 "application " | TOPICAL_OINTMENT | Freq: Two times a day (BID) | CUTANEOUS | Status: AC

## 2016-02-08 MED ORDER — NAPROXEN 250 MG PO TABS: 250.0000 mg | ORAL_TABLET | Freq: Two times a day (BID) | ORAL | Status: AC

## 2016-02-08 MED ORDER — LIDOCAINE-EPINEPHRINE (PF) 1 %-1:200000 IJ SOLN
20.0000 mL | Freq: Once | INTRAMUSCULAR | Status: DC
  Filled 2016-02-08: qty 30

## 2016-02-08 NOTE — ED Notes (Signed)
Called back to triage x1 with no response 

## 2016-02-08 NOTE — ED Provider Notes (Signed)
CSN: [id number]     Arrival date & time 02/08/16  1950 History  By signing my name below, I, Placido SouLogan Joldersma, attest that this documentation has been prepared under the direction and in the presence of Everlene FarrierWilliam Nyja Westbrook, PA-C. Electronically Signed: Placido SouLogan Joldersma, ED Scribe. 02/08/2016. 9:15 PM.   Chief Complaint  Patient presents with  . Extremity Laceration   The history is provided by the patient. No language interpreter was used.    HPI Comments: Russell [name] is a 45 y.o. male who presents to the Emergency Department complaining of a moderate laceration with controlled bleeding to his right lateral calf which occurred 2 hours PTA. Pt states that he was cutting wood with a saw which slipped and caused his laceration. Patient complains of mild pain surrounding the wound that worsens with palpation. No numbness, tingling or weakness. Pt reports consuming 2 beers prior to the accident. He confirms his tetanus vaccination is UTD. He denies any other associated symptoms at this time.    Past Medical History  Diagnosis Date  . Hernia   . Bipolar 1 disorder (HCC)   . Schizophrenia (HCC)   . Depression   . PONV (postoperative nausea and vomiting)     Over twenty years ago.  Marland Kitchen. Anxiety   . Epilepsy (HCC)     last sz 03/09/15   Past Surgical History  Procedure Laterality Date  . Ankle fracture surgery  1994 - approximate  . Ligament repair  1998    right arm/wrist   Family History  Problem Relation Age of Onset  . Diabetes Father   . Alcoholism Brother   . Cancer Maternal Grandmother   . Cancer Paternal Grandfather    Social History  Substance Use Topics  . Smoking status: Former Smoker -- 0.50 packs/day    Types: Cigarettes    Quit date: 03/22/2000  . Smokeless tobacco: Never Used  . Alcohol Use: 0.0 oz/week    0 Standard drinks or equivalent per week     Comment: Drinks: 2-3 month. Last drink: Saturday    Review of Systems  Constitutional: Negative for fever.   Musculoskeletal: Positive for myalgias.  Skin: Positive for wound. Negative for color change.  Neurological: Negative for weakness and numbness.    Allergies  Ultram  Home Medications   Prior to Admission medications   Medication Sig Start Date End Date Taking? Authorizing Provider  bacitracin ointment Apply 1 application topically 2 (two) times daily. 02/08/16   Everlene FarrierWilliam Yonna Alwin, PA-C  divalproex (DEPAKOTE ER) 500 MG 24 hr tablet Take 3 tablets (1,500 mg total) by mouth every morning. 09/20/15   Suanne MarkerVikram R Penumalli, MD  naproxen (NAPROSYN) 250 MG tablet Take 1 tablet (250 mg total) by mouth 2 (two) times daily with a meal. 02/08/16   Everlene FarrierWilliam Ariellah Faust, PA-C  omeprazole (PRILOSEC) 40 MG capsule Take 40 mg by mouth daily. Reported on 09/20/2015 07/19/14   Historical Provider, MD  oxyCODONE-acetaminophen (PERCOCET/ROXICET) 5-325 MG tablet Take 2 tablets by mouth every 4 (four) hours as needed for severe pain. 07/22/15   Elson AreasLeslie K Sofia, PA-C   BP 129/83 mmHg  Pulse 95  Temp(Src) 98.3 F (36.8 C) (Oral)  Resp 18  SpO2 99%    Physical Exam  Constitutional: He is oriented to person, place, and time. He appears well-developed and well-nourished. No distress.  Nontoxic-appearing. The patient does not appear intoxicated.  HENT:  Head: Normocephalic and atraumatic.  Eyes: Right eye exhibits no discharge. Left eye exhibits no discharge.  Cardiovascular:  Normal rate, regular rhythm and intact distal pulses.   Bilateral dorsalis pedis and posterior tibialis pulses are intact.  Pulmonary/Chest: Effort normal. No respiratory distress.  Musculoskeletal: Normal range of motion.  Good strength in plantar and dorsiflexion of his right foot.  Neurological: He is alert and oriented to person, place, and time. Coordination normal.  Good strength to RLE. Sensation intact to BLE Normal gait.  Skin: Skin is warm and dry. Laceration noted. No rash noted. He is not diaphoretic.  4.5 cm superficial laceration to  the lateral aspect of the right distal leg. No muscle body exposure. No tendon exposure. No evidence of tendon involvement. No foreign bodies noted. Bleeding is controlled.  Psychiatric: He has a normal mood and affect. His behavior is normal.  Nursing note and vitals reviewed.   ED Course  .Marland KitchenLaceration Repair Date/Time: 02/08/2016 9:45 PM Performed by: Everlene Farrier Authorized by: Everlene Farrier Consent: Verbal consent obtained. Risks and benefits: risks, benefits and alternatives were discussed Consent given by: patient Patient understanding: patient states understanding of the procedure being performed Patient consent: the patient's understanding of the procedure matches consent given Procedure consent: procedure consent matches procedure scheduled Relevant documents: relevant documents present and verified Test results: test results available and properly labeled Site marked: the operative site was marked Imaging studies: imaging studies available Required items: required blood products, implants, devices, and special equipment available Patient identity confirmed: verbally with patient Time out: Immediately prior to procedure a "time out" was called to verify the correct patient, procedure, equipment, support staff and site/side marked as required. Body area: lower extremity Location details: right lower leg Laceration length: 4.5 cm Foreign bodies: no foreign bodies Tendon involvement: none Nerve involvement: none Vascular damage: no Anesthesia: local infiltration Local anesthetic: lidocaine 1% with epinephrine Anesthetic total: 6 ml Patient sedated: no Preparation: Patient was prepped and draped in the usual sterile fashion. Irrigation solution: saline Irrigation method: jet lavage and syringe Amount of cleaning: extensive Debridement: none Degree of undermining: none Skin closure: 3-0 Prolene Number of sutures: 5 Technique: simple and horizontal  mattress Approximation: close Approximation difficulty: complex Dressing: 4x4 sterile gauze, antibiotic ointment and non-adhesive packing strip Patient tolerance: Patient tolerated the procedure well with no immediate complications Comments: Sutures placed by PA-Student Elray Buba under my direct supervision.  3 horizontal mattress stitch sutures placed and 2 simple interrupted sutures placed.    DIAGNOSTIC STUDIES: Oxygen Saturation is 99% on RA, normal by my interpretation.    COORDINATION OF CARE: 8:55 PM Discussed next steps with pt. Pt verbalized understanding and is agreeable with the plan.   Labs Review Labs Reviewed - No data to display  Imaging Review Dg Tibia/fibula Right  02/08/2016  CLINICAL DATA:  Laceration RIGHT lower leg laterally, accidentally hit his leg with a saw blade tonight, initial encounter EXAM: RIGHT TIBIA AND FIBULA - 2 VIEW COMPARISON:  RIGHT knee radiographs 09/19/2012 FINDINGS: Osseous mineralization normal. Knee and ankle joint alignments normal. No acute fracture, dislocation, or bone destruction. Minimal soft tissue lucency at the lateral margin of the mid RIGHT lower leg question representing laceration. No radiopaque foreign bodies. IMPRESSION: No acute osseous abnormalities. Electronically Signed   By: Ulyses Southward M.D.   On: 02/08/2016 20:57  I personally reviewed and evaluated these images as part of my medical decision-making.    EKG Interpretation None      Filed Vitals:   02/08/16 2012  BP: 129/83  Pulse: 95  Temp: 98.3 F (36.8 C)  TempSrc: Oral  Resp: 18  SpO2: 99%     MDM   Meds given in ED:  Medications  lidocaine-EPINEPHrine (XYLOCAINE-EPINEPHrine) 1 %-1:200000 (PF) injection 20 mL (not administered)    New Prescriptions   BACITRACIN OINTMENT    Apply 1 application topically 2 (two) times daily.   NAPROXEN (NAPROSYN) 250 MG TABLET    Take 1 tablet (250 mg total) by mouth 2 (two) times daily with a meal.    Final  diagnoses:  Leg laceration, right, initial encounter   This is a 45 y.o. male who presents to the Emergency Department complaining of a moderate laceration with controlled bleeding to his right lateral calf which occurred 2 hours PTA. Pt states that he was cutting wood with a saw which slipped and caused his laceration. Patient complains of mild pain surrounding the wound that worsens with palpation. No numbness, tingling or weakness.  On exam the patient is a 4 and half centimeter superficial laceration to his lateral right lower leg. No evidence of muscle body or tendon involvement. He is neurovascularly intact. Tetanus UTD. Laceration occurred < 12 hours prior to repair. Discussed laceration care with pt and answered questions. Pt to f-u for suture removal in 7-10 days and wound check sooner should there be signs of dehiscence or infection. Pt is hemodynamically stable with no complaints prior to dc.  I advised the patient to follow-up with their primary care provider this week. I advised the patient to return to the emergency department with new or worsening symptoms or new concerns. The patient verbalized understanding and agreement with plan.    I personally performed the services described in this documentation, which was scribed in my presence. The recorded information has been reviewed and is accurate.       Everlene FarrierWilliam Gemini Beaumier, PA-C 02/08/16 2233  Mancel BaleElliott Wentz, MD 02/09/16 [phone]

## 2016-02-08 NOTE — Discharge Instructions (Signed)

## 2016-02-08 NOTE — ED Notes (Signed)
Pt states he cut his right lower leg with a saw tonight at 7PM. Pt has 4cm laceration to his exterior right calf.

## 2016-03-20 ENCOUNTER — Ambulatory Visit: Payer: Medicaid Other | Admitting: Diagnostic Neuroimaging

## 2016-06-27 ENCOUNTER — Telehealth: Payer: Self-pay | Admitting: Neurology

## 2016-06-27 MED ORDER — DIVALPROEX SODIUM 250 MG PO DR TAB: 750.0000 mg | DELAYED_RELEASE_TABLET | Freq: Two times a day (BID) | ORAL | 12 refills | Status: AC

## 2016-06-27 NOTE — Telephone Encounter (Signed)
Patient requests IT instead of ER Depakote due to loss of insurance. He has several days left. Wants it to go to [facility] but doesn;t know which one. He will page back. Pleas let m eknow how you would like to change it thanks

## 2016-06-27 NOTE — Addendum Note (Signed)
Addended byJoycelyn Schmid: PENUMALLI, VIKRAM on: 06/27/2016 03:25 PM   Modules accepted: Orders

## 2016-06-27 NOTE — Telephone Encounter (Signed)
I called patient. No answer x 2. I sent divalproex 750mg  BID to his last pharmacy [facility]). -VRP

## 2016-07-02 NOTE — Telephone Encounter (Signed)
Pt called wanting to know if divalproex (DEPAKOTE) 250 MG DR tablet was "regular" depakote. He said ER cost too much. RN was not available. Pt advised he will call [facility] to transfer RX [facility]. I told him to ask the pharmacist what medication had been called, he could advise him. He agreed and was appreciative.

## 2017-01-16 DEATH — deceased
